# Patient Record
Sex: Female | Born: 1935
Health system: Southern US, Community
[De-identification: ages and names within clinical notes are randomized; demographics above are authoritative.]

## PROBLEM LIST (undated history)

## (undated) DIAGNOSIS — N83209 Unspecified ovarian cyst, unspecified side: Secondary | ICD-10-CM

## (undated) DIAGNOSIS — F329 Major depressive disorder, single episode, unspecified: Secondary | ICD-10-CM

## (undated) DIAGNOSIS — H269 Unspecified cataract: Secondary | ICD-10-CM

## (undated) DIAGNOSIS — E785 Hyperlipidemia, unspecified: Secondary | ICD-10-CM

## (undated) DIAGNOSIS — C50919 Malignant neoplasm of unspecified site of unspecified female breast: Secondary | ICD-10-CM

## (undated) DIAGNOSIS — I10 Essential (primary) hypertension: Secondary | ICD-10-CM

## (undated) DIAGNOSIS — K5790 Diverticulosis of intestine, part unspecified, without perforation or abscess without bleeding: Secondary | ICD-10-CM

## (undated) DIAGNOSIS — N950 Postmenopausal bleeding: Secondary | ICD-10-CM

## (undated) DIAGNOSIS — K5792 Diverticulitis of intestine, part unspecified, without perforation or abscess without bleeding: Secondary | ICD-10-CM

## (undated) DIAGNOSIS — F32A Depression, unspecified: Secondary | ICD-10-CM

## (undated) DIAGNOSIS — M1990 Unspecified osteoarthritis, unspecified site: Secondary | ICD-10-CM

## (undated) DIAGNOSIS — R55 Syncope and collapse: Secondary | ICD-10-CM

## (undated) DIAGNOSIS — R079 Chest pain, unspecified: Secondary | ICD-10-CM

## (undated) DIAGNOSIS — R87619 Unspecified abnormal cytological findings in specimens from cervix uteri: Secondary | ICD-10-CM

## (undated) DIAGNOSIS — M8580 Other specified disorders of bone density and structure, unspecified site: Secondary | ICD-10-CM

## (undated) DIAGNOSIS — E049 Nontoxic goiter, unspecified: Secondary | ICD-10-CM

## (undated) DIAGNOSIS — E559 Vitamin D deficiency, unspecified: Secondary | ICD-10-CM

## (undated) DIAGNOSIS — K635 Polyp of colon: Secondary | ICD-10-CM

## (undated) DIAGNOSIS — E7800 Pure hypercholesterolemia, unspecified: Secondary | ICD-10-CM

## (undated) DIAGNOSIS — G250 Essential tremor: Secondary | ICD-10-CM

## (undated) DIAGNOSIS — Z950 Presence of cardiac pacemaker: Secondary | ICD-10-CM

## (undated) HISTORY — DX: Other specified disorders of bone density and structure, unspecified site: M85.80

## (undated) HISTORY — DX: Syncope and collapse: R55

## (undated) HISTORY — DX: Pure hypercholesterolemia, unspecified: E78.00

## (undated) HISTORY — DX: Unspecified ovarian cyst, unspecified side: N83.209

## (undated) HISTORY — DX: Vitamin D deficiency, unspecified: E55.9

## (undated) HISTORY — DX: Nontoxic goiter, unspecified: E04.9

## (undated) HISTORY — DX: Unspecified abnormal cytological findings in specimens from cervix uteri: R87.619

## (undated) HISTORY — DX: Diverticulitis of intestine, part unspecified, without perforation or abscess without bleeding: K57.92

## (undated) HISTORY — DX: Depression, unspecified: F32.A

## (undated) HISTORY — DX: Polyp of colon: K63.5

## (undated) HISTORY — DX: Unspecified cataract: H26.9

## (undated) HISTORY — DX: Postmenopausal bleeding: N95.0

## (undated) HISTORY — PX: HYSTEROSCOPY: SHX211

## (undated) HISTORY — DX: Major depressive disorder, single episode, unspecified: F32.9

---

## 1994-08-23 HISTORY — PX: BREAST LUMPECTOMY: SHX2

## 1995-12-23 DIAGNOSIS — M8580 Other specified disorders of bone density and structure, unspecified site: Secondary | ICD-10-CM

## 1995-12-23 HISTORY — DX: Other specified disorders of bone density and structure, unspecified site: M85.80

## 1997-12-23 ENCOUNTER — Other Ambulatory Visit: Admission: RE | Admit: 1997-12-23 | Discharge: 1997-12-23 | Payer: Self-pay | Admitting: Obstetrics and Gynecology

## 1998-07-04 ENCOUNTER — Other Ambulatory Visit: Admission: RE | Admit: 1998-07-04 | Discharge: 1998-07-04 | Payer: Self-pay | Admitting: Obstetrics and Gynecology

## 1998-11-03 ENCOUNTER — Other Ambulatory Visit: Admission: RE | Admit: 1998-11-03 | Discharge: 1998-11-03 | Payer: Self-pay | Admitting: Obstetrics and Gynecology

## 1999-03-20 ENCOUNTER — Encounter: Admission: RE | Admit: 1999-03-20 | Discharge: 1999-06-18 | Payer: Self-pay | Admitting: Surgery

## 2000-01-21 ENCOUNTER — Other Ambulatory Visit: Admission: RE | Admit: 2000-01-21 | Discharge: 2000-01-21 | Payer: Self-pay | Admitting: Obstetrics and Gynecology

## 2000-01-23 DIAGNOSIS — N950 Postmenopausal bleeding: Secondary | ICD-10-CM

## 2000-01-23 HISTORY — DX: Postmenopausal bleeding: N95.0

## 2000-02-09 ENCOUNTER — Encounter (INDEPENDENT_AMBULATORY_CARE_PROVIDER_SITE_OTHER): Payer: Self-pay | Admitting: Specialist

## 2000-02-09 ENCOUNTER — Other Ambulatory Visit: Admission: RE | Admit: 2000-02-09 | Discharge: 2000-02-09 | Payer: Self-pay | Admitting: Obstetrics and Gynecology

## 2000-02-23 ENCOUNTER — Ambulatory Visit (HOSPITAL_COMMUNITY): Admission: RE | Admit: 2000-02-23 | Discharge: 2000-02-23 | Payer: Self-pay | Admitting: Obstetrics and Gynecology

## 2000-02-23 ENCOUNTER — Encounter (INDEPENDENT_AMBULATORY_CARE_PROVIDER_SITE_OTHER): Payer: Self-pay | Admitting: Specialist

## 2001-01-05 ENCOUNTER — Other Ambulatory Visit: Admission: RE | Admit: 2001-01-05 | Discharge: 2001-01-05 | Payer: Self-pay | Admitting: Obstetrics and Gynecology

## 2002-01-08 ENCOUNTER — Other Ambulatory Visit: Admission: RE | Admit: 2002-01-08 | Discharge: 2002-01-08 | Payer: Self-pay | Admitting: Obstetrics and Gynecology

## 2003-02-12 ENCOUNTER — Encounter: Admission: RE | Admit: 2003-02-12 | Discharge: 2003-02-12 | Payer: Self-pay | Admitting: Gastroenterology

## 2003-02-12 ENCOUNTER — Encounter: Payer: Self-pay | Admitting: Gastroenterology

## 2003-03-11 ENCOUNTER — Ambulatory Visit (HOSPITAL_COMMUNITY): Admission: RE | Admit: 2003-03-11 | Discharge: 2003-03-11 | Payer: Self-pay | Admitting: Gastroenterology

## 2003-03-11 ENCOUNTER — Encounter (INDEPENDENT_AMBULATORY_CARE_PROVIDER_SITE_OTHER): Payer: Self-pay | Admitting: Specialist

## 2003-04-15 ENCOUNTER — Ambulatory Visit (HOSPITAL_COMMUNITY): Admission: RE | Admit: 2003-04-15 | Discharge: 2003-04-15 | Payer: Self-pay | Admitting: Gastroenterology

## 2003-04-15 ENCOUNTER — Encounter (INDEPENDENT_AMBULATORY_CARE_PROVIDER_SITE_OTHER): Payer: Self-pay | Admitting: *Deleted

## 2004-01-15 ENCOUNTER — Other Ambulatory Visit: Admission: RE | Admit: 2004-01-15 | Discharge: 2004-01-15 | Payer: Self-pay | Admitting: Obstetrics and Gynecology

## 2006-01-17 ENCOUNTER — Other Ambulatory Visit: Admission: RE | Admit: 2006-01-17 | Discharge: 2006-01-17 | Payer: Self-pay | Admitting: Obstetrics & Gynecology

## 2006-01-27 ENCOUNTER — Other Ambulatory Visit: Admission: RE | Admit: 2006-01-27 | Discharge: 2006-01-27 | Payer: Self-pay | Admitting: Obstetrics & Gynecology

## 2006-07-19 ENCOUNTER — Ambulatory Visit: Payer: Self-pay | Admitting: Gastroenterology

## 2006-08-24 ENCOUNTER — Ambulatory Visit: Payer: Self-pay | Admitting: Pulmonary Disease

## 2006-09-15 ENCOUNTER — Ambulatory Visit: Payer: Self-pay | Admitting: Gastroenterology

## 2006-09-29 ENCOUNTER — Encounter (INDEPENDENT_AMBULATORY_CARE_PROVIDER_SITE_OTHER): Payer: Self-pay | Admitting: Specialist

## 2006-09-29 ENCOUNTER — Ambulatory Visit: Payer: Self-pay | Admitting: Gastroenterology

## 2008-01-25 ENCOUNTER — Other Ambulatory Visit: Admission: RE | Admit: 2008-01-25 | Discharge: 2008-01-25 | Payer: Self-pay | Admitting: Obstetrics & Gynecology

## 2009-09-19 ENCOUNTER — Encounter (INDEPENDENT_AMBULATORY_CARE_PROVIDER_SITE_OTHER): Payer: Self-pay | Admitting: *Deleted

## 2009-10-14 ENCOUNTER — Encounter (INDEPENDENT_AMBULATORY_CARE_PROVIDER_SITE_OTHER): Payer: Self-pay | Admitting: *Deleted

## 2009-10-16 ENCOUNTER — Ambulatory Visit: Payer: Self-pay | Admitting: Gastroenterology

## 2009-10-27 ENCOUNTER — Telehealth: Payer: Self-pay | Admitting: Gastroenterology

## 2009-10-31 ENCOUNTER — Ambulatory Visit: Payer: Self-pay | Admitting: Gastroenterology

## 2010-05-29 ENCOUNTER — Inpatient Hospital Stay (HOSPITAL_COMMUNITY)
Admission: EM | Admit: 2010-05-29 | Discharge: 2010-05-30 | Payer: Self-pay | Source: Home / Self Care | Attending: Internal Medicine | Admitting: Internal Medicine

## 2010-05-30 ENCOUNTER — Encounter (INDEPENDENT_AMBULATORY_CARE_PROVIDER_SITE_OTHER): Payer: Self-pay | Admitting: Internal Medicine

## 2010-06-08 LAB — CARDIAC PANEL(CRET KIN+CKTOT+MB+TROPI)
CK, MB: 1.7 ng/mL (ref 0.3–4.0)
CK, MB: 1.5 ng/mL (ref 0.3–4.0)
Relative Index: INVALID (ref 0.0–2.5)
Relative Index: INVALID (ref 0.0–2.5)
Total CK: 71 U/L (ref 7–177)
Total CK: 72 U/L (ref 7–177)
Troponin I: 0.02 ng/mL (ref 0.00–0.06)
Troponin I: 0.02 ng/mL (ref 0.00–0.06)

## 2010-06-08 LAB — DIFFERENTIAL
Basophils Absolute: 0 10*3/uL (ref 0.0–0.1)
Basophils Absolute: 0 10*3/uL (ref 0.0–0.1)
Basophils Relative: 0 % (ref 0–1)
Basophils Relative: 0 % (ref 0–1)
Eosinophils Absolute: 0.1 10*3/uL (ref 0.0–0.7)
Eosinophils Absolute: 0.1 10*3/uL (ref 0.0–0.7)
Eosinophils Relative: 1 % (ref 0–5)
Eosinophils Relative: 1 % (ref 0–5)
Lymphocytes Relative: 9 % — ABNORMAL LOW (ref 12–46)
Lymphocytes Relative: 14 % (ref 12–46)
Lymphs Abs: 1.1 10*3/uL (ref 0.7–4.0)
Lymphs Abs: 1.4 10*3/uL (ref 0.7–4.0)
Monocytes Absolute: 1.1 10*3/uL — ABNORMAL HIGH (ref 0.1–1.0)
Monocytes Absolute: 1.3 10*3/uL — ABNORMAL HIGH (ref 0.1–1.0)
Monocytes Relative: 9 % (ref 3–12)
Monocytes Relative: 13 % — ABNORMAL HIGH (ref 3–12)
Neutro Abs: 9.9 10*3/uL — ABNORMAL HIGH (ref 1.7–7.7)
Neutro Abs: 7.2 10*3/uL (ref 1.7–7.7)
Neutrophils Relative %: 81 % — ABNORMAL HIGH (ref 43–77)
Neutrophils Relative %: 72 % (ref 43–77)

## 2010-06-08 LAB — CBC
HCT: 42.8 % (ref 36.0–46.0)
HCT: 41.6 % (ref 36.0–46.0)
Hemoglobin: 14.7 g/dL (ref 12.0–15.0)
Hemoglobin: 14.4 g/dL (ref 12.0–15.0)
MCH: 30.7 pg (ref 26.0–34.0)
MCH: 31 pg (ref 26.0–34.0)
MCHC: 34.3 g/dL (ref 30.0–36.0)
MCHC: 34.6 g/dL (ref 30.0–36.0)
MCV: 89.4 fL (ref 78.0–100.0)
MCV: 89.5 fL (ref 78.0–100.0)
Platelets: 193 10*3/uL (ref 150–400)
Platelets: 210 10*3/uL (ref 150–400)
RBC: 4.79 MIL/uL (ref 3.87–5.11)
RBC: 4.65 MIL/uL (ref 3.87–5.11)
RDW: 12.9 % (ref 11.5–15.5)
RDW: 13.1 % (ref 11.5–15.5)
WBC: 12.3 10*3/uL — ABNORMAL HIGH (ref 4.0–10.5)
WBC: 10.1 10*3/uL (ref 4.0–10.5)

## 2010-06-08 LAB — COMPREHENSIVE METABOLIC PANEL
ALT: 35 U/L (ref 0–35)
AST: 43 U/L — ABNORMAL HIGH (ref 0–37)
Albumin: 3.4 g/dL — ABNORMAL LOW (ref 3.5–5.2)
Alkaline Phosphatase: 67 U/L (ref 39–117)
BUN: 12 mg/dL (ref 6–23)
CO2: 25 mEq/L (ref 19–32)
Calcium: 8.7 mg/dL (ref 8.4–10.5)
Chloride: 103 mEq/L (ref 96–112)
Creatinine, Ser: 0.8 mg/dL (ref 0.4–1.2)
GFR calc Af Amer: >60 mL/min (ref >60)
GFR calc non Af Amer: >60 mL/min (ref >60)
Glucose, Bld: 138 mg/dL — ABNORMAL HIGH (ref 70–99)
Potassium: 3.8 mEq/L (ref 3.5–5.1)
Sodium: 135 mEq/L (ref 135–145)
Total Bilirubin: 0.5 mg/dL (ref 0.3–1.2)
Total Protein: 6.6 g/dL (ref 6.0–8.3)

## 2010-06-08 LAB — POCT CARDIAC MARKERS
CKMB, poc: <1 ng/mL — ABNORMAL LOW (ref 1.0–8.0)
Myoglobin, poc: 59.5 ng/mL (ref 12–200)
Troponin i, poc: <0.05 ng/mL (ref 0.00–0.09)

## 2010-06-08 LAB — CK TOTAL AND CKMB (NOT AT ARMC)
CK, MB: 1.7 ng/mL (ref 0.3–4.0)
Relative Index: INVALID (ref 0.0–2.5)
Total CK: 72 U/L (ref 7–177)

## 2010-06-08 LAB — HEMOGLOBIN A1C
Hgb A1c MFr Bld: 5.6 % (ref <5.7)
Mean Plasma Glucose: 114 mg/dL (ref <117)

## 2010-06-08 LAB — POCT I-STAT, CHEM 8
BUN: 17 mg/dL (ref 6–23)
Calcium, Ion: 1.09 mmol/L — ABNORMAL LOW (ref 1.12–1.32)
Chloride: 104 mEq/L (ref 96–112)
Creatinine, Ser: 0.8 mg/dL (ref 0.4–1.2)
Glucose, Bld: 138 mg/dL — ABNORMAL HIGH (ref 70–99)
HCT: 45 % (ref 36.0–46.0)
Hemoglobin: 15.3 g/dL — ABNORMAL HIGH (ref 12.0–15.0)
Potassium: 3.2 mEq/L — ABNORMAL LOW (ref 3.5–5.1)
Sodium: 137 mEq/L (ref 135–145)
TCO2: 25 mmol/L (ref 0–100)

## 2010-06-08 LAB — TROPONIN I: Troponin I: 0.01 ng/mL (ref 0.00–0.06)

## 2010-06-08 LAB — D-DIMER, QUANTITATIVE: D-Dimer, Quant: <0.22 ug/mL-FEU (ref 0.00–0.48)

## 2010-06-19 NOTE — H&P (Signed)
Michelle Fuller, [name]:  [id number]  MEDICAL RECORD NO.:  [id number]          PATIENT TYPE:  INP  LOCATION:  1831                         FACILITY:  [facility]  PHYSICIAN:  Massie Maroon, MD        DATE OF BIRTH:  Nov 12, 1934  DATE OF ADMISSION:  05/29/2010 DATE OF DISCHARGE:                             HISTORY & PHYSICAL   CHIEF COMPLAINT:  "I passed out."  HISTORY OF PRESENT ILLNESS:  A 76 year old female with a history of hypertension, hyperlipidemia, tremors, and remote history of breast cancer apparently was sitting at supper when she passed out.  She had apparently drank some at suppertime and also had Klonopin.  She thinks that this might have contributed to her passing out.  She also noted that she has not been feeling well recently and has not been eating or drinking as much in terms of p.o. intake.  EMS arrived at the restaurant and noticed that the patient was apparently unconscious.  She has been passed out for nearly about 5 minutes per her husband.  The patient states that she had no presyncopal symptoms such as lightheadedness, dizziness, chest pain, palpitations, shortness of breath, nausea, vomiting.  The patient was noted to have O2 sat of 89% and her blood pressure was apparently 92/50 initially with a heart rate of 60.  Her husband who witnessed the syncope apparently states that she did not have any seizure-like activity, incontinence or focal weakness, numbness, tingling, or postictal state when she woke up.  The patient will be admitted for workup of syncope.  Orthostatics were done in the ED, but they were done about 3 hours after arrival.  It appeared to be normal.  Admission for syncope as stated above.  PAST MEDICAL HISTORY: 1. Hypertension. 2. Hyperlipidemia. 3. Essential tremor. 4. History of breast cancer. 5. History of goiter.  PAST SURGICAL HISTORY:  Right lumpectomy in 1996.  SOCIAL HISTORY:  The patient has never smoked and  drinks about half a glass of wine per night.  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  FAMILY HISTORY:  Mother died of heart attack and was a nonsmoker.  She died at age 32.  Her father died at age 98 of old age and had Alzheimer's.  ALLERGIES:  PENICILLIN causes rash.  MEDICATIONS: 1. Klonopin 1 mg p.o. daily as needed. 2. Norvasc 5 mg p.o. daily. 3. Propranolol SR 160 mg p.o. daily. 4. Diovan 320 mg p.o. daily. 5. Evista 60 mg p.o. daily. 6. Indapamide 1.25 mg p.o. daily. 7. Lipitor 10 mg p.o. daily. 8. Multivitamin 1 p.o. daily. 9. Prilosec 20 mg p.o. daily as needed. 10.Primidone 150 mg p.o. at bedtime.  REVIEW OF SYSTEMS:  Negative for all 10-organ systems except for pertinent positives as stated above.  PHYSICAL EXAMINATION:  VITAL SIGNS:  Temperature 97.3, pulse 68, blood pressure 100/56, pulse ox is 99% on room air. HEENT:  Anicteric, EOMI, no nystagmus.  Pupils 1.5 mm, symmetric direct consensual, near reflex intact.  Mucous membranes moist.  Tongue midline. NECK:  No JVD, no bruit, no thyromegaly, no adenopathy. HEART:  Regular  rate and rhythm.  S1 and S2.  No murmurs, gallops, or rubs. LUNGS:  Clear to auscultation bilaterally. ABDOMEN:  Soft, obese, nontender, nondistended.  Positive bowel sounds. EXTREMITIES:  No cyanosis, clubbing or edema. SKIN:  No rashes. LYMPH NODES:  No adenopathy. NEUROLOGIC:  Nonfocal.  Cranial nerves II through XII intact.  Reflexes 2+, symmetric, diffuse with downgoing toes bilaterally.  Motor strength 5/5 in all 4 extremities, pinprick intact, no pronator drift.  LABORATORY DATA:  Cardiac markers: Troponin I less than 0.05.  WBC 12.3, hemoglobin 14.7, platelet count is 193.  BUN 17, creatinine 0.8.  Sodium 137, potassium 3.2.  Chest x-ray shows that the heart is upper limits of normal size, minimal bibasilar atelectasis.  EKG shows normal sinus rhythm at 70, normal axis.  First-degree AV block, early R-wave  progression.  No ST-T segment changes consistent with acute ischemia.  There was noted T-wave inversion in aVL, but this is the only abnormality that I saw.  There is no prior EKG for comparison.  ASSESSMENT AND PLAN: 1. Syncope:  We will check a CT brain.  We will check cardiac markers     q.6 h x3 sets.  The patient will be placed on telemetry.  We will     check a cardiac echo as well as a carotid ultrasound.  We will     check urinalysis to make sure that there is no urinary tract     infection precipitating weakness and possible syncope.  I believe     that her syncope is most likely a combination of Klonopin and     alcohol and probably some dehydration due to the fact that she had     not been eating and drinking well because of recent illness, that     is the most likely cause of her hypotension.  She may have even     been orthostatic; however, orthostatics were not checked until 4     hours after the time that she initially arrived in the ED.  We will     check a D-dimer and if positive, obtain a CTA chest to rule out     pulmonary embolus as a cause of syncope. 2. Hypertension:  Continue Norvasc, propranolol, and Diovan and hold     off on indapamide for now in light of her hypokalemia.  This can be     restarted if her blood pressure rises. 3. Hypokalemia:  Replete potassium. 4. Hyperlipidemia:  Continue Lipitor. 5. Essential tremor:  Continue primidone. 6. Deep venous thrombosis prophylaxis:  Lovenox.     Massie Maroon, MD     JYK/MEDQ  D:  05/30/2010  T:  05/30/2010  Job:  [id number]  cc:   [name], M.D.  Electronically Signed by Pearson Grippe MD on 06/19/2010 11:50:17 PM

## 2010-06-23 NOTE — Progress Notes (Signed)
Summary: phone  Phone Note Call from Patient   Caller: Patient Summary of Call: Pt phoned, stating she had been started on a 10 day antibiotic from Urgent Care.  She said she has had some diarrhea since starting ATB and wondered if that would affect her exam.  Writer told her to follow prep instructions but to really push fluids before exam so as not to become dehydrated.  Pt voiced understanding. Initial call taken by: Karl Bales RN,  October 27, 2009 2:40 PM

## 2010-06-23 NOTE — Letter (Signed)
Summary: [facility] Instructions  [facility]  [address] [place], [place] [address]   Phone: [phone]  Fax: [phone]       Michelle Fuller    05/09/1935    MRN: [id number]        Procedure Day Dorna Bloom: Farrell Ours  10/31/09     Arrival Time:  7:30am     Procedure Time:  8:30am     Location of Procedure:                    Juliann Pares  [facility] (4th Floor)                        PREPARATION FOR COLONOSCOPY WITH MOVIPREP   Starting 5 days prior to your procedure  SUNDAY 06/05  do not eat nuts, seeds, popcorn, corn, beans, peas,  salads, or any raw vegetables.  Do not take any fiber supplements (e.g. Metamucil, Citrucel, and Benefiber).  THE DAY BEFORE YOUR PROCEDURE         DATE:  THURSDAY  06/09  1.  Drink clear liquids the entire day-NO SOLID FOOD  2.  Do not drink anything colored red or purple.  Avoid juices with pulp.  No orange juice.  3.  Drink at least 64 oz. (8 glasses) of fluid/clear liquids during the day to prevent dehydration and help the prep work efficiently.  CLEAR LIQUIDS INCLUDE: Water Jello Ice Popsicles Tea (sugar ok, no milk/cream) Powdered fruit flavored drinks Coffee (sugar ok, no milk/cream) Gatorade Juice: apple, white grape, white cranberry  Lemonade Clear bullion, consomm, broth Carbonated beverages (any kind) Strained chicken noodle soup Hard Candy                             4.  In the morning, mix first dose of MoviPrep solution:    Empty 1 Pouch A and 1 Pouch B into the disposable container    Add lukewarm drinking water to the top line of the container. Mix to dissolve    Refrigerate (mixed solution should be used within 24 hrs)  5.  Begin drinking the prep at 5:00 p.m. The MoviPrep container is divided by 4 marks.   Every 15 minutes drink the solution down to the next mark (approximately 8 oz) until the full liter is complete.   6.  Follow completed prep with 16 oz of clear liquid of your choice  (Nothing red or purple).  Continue to drink clear liquids until bedtime.  7.  Before going to bed, mix second dose of MoviPrep solution:    Empty 1 Pouch A and 1 Pouch B into the disposable container    Add lukewarm drinking water to the top line of the container. Mix to dissolve    Refrigerate  THE DAY OF YOUR PROCEDURE      DATE: FRIDAY  06/10  Beginning at  3:30 a.m. (5 hours before procedure):         1. Every 15 minutes, drink the solution down to the next mark (approx 8 oz) until the full liter is complete.  2. Follow completed prep with 16 oz. of clear liquid of your choice.    3. You may drink clear liquids until  6:30am  (2 HOURS BEFORE PROCEDURE).   MEDICATION INSTRUCTIONS  Unless otherwise instructed, you should take regular prescription medications with a small sip of water   as  early as possible the morning of your procedure.         OTHER INSTRUCTIONS  You will need a responsible adult at least 74 years of age to accompany you and drive you home.   This person must remain in the waiting room during your procedure.  Wear loose fitting clothing that is easily removed.  Leave jewelry and other valuables at home.  However, you may wish to bring a book to read or  an iPod/MP3 player to listen to music as you wait for your procedure to start.  Remove all body piercing jewelry and leave at home.  Total time from sign-in until discharge is approximately 2-3 hours.  You should go home directly after your procedure and rest.  You can resume normal activities the  day after your procedure.  The day of your procedure you should not:   Drive   Make legal decisions   Operate machinery   Drink alcohol   Return to work  You will receive specific instructions about eating, activities and medications before you leave.    The above instructions have been reviewed and explained to me by  Karl Bales RN  Oct 16, 2009 1:43 PM      I fully understand  and can verbalize these instructions _____________________________ Date _________

## 2010-06-23 NOTE — Procedures (Signed)
Summary: Colonoscopy  Patient: [name] Note: All result statuses are Final unless otherwise noted.  Tests: (1) Colonoscopy (COL)   COL Colonoscopy           DONE     [facility]     [address]     [place], [place]  [address]           COLONOSCOPY PROCEDURE REPORT           PATIENT:  Michelle Fuller, Michelle Fuller  MR#:  [id number]     BIRTHDATE:  12/12/1934, 75 yrs. old  GENDER:  female     ENDOSCOPIST:  Judie Petit T. Russella Dar, MD, [facility]           PROCEDURE DATE:  10/31/2009     PROCEDURE:  Higher-risk screening colonoscopy G0105     ASA CLASS:  Class II     INDICATIONS:  follow-up of polyp, surveillance and high-risk     screening, tubulovillous adenoma, 03/2003.     MEDICATIONS:   Fentanyl 100 mcg IV, Versed 10 mg IV     DESCRIPTION OF PROCEDURE:   After the risks benefits and     alternatives of the procedure were thoroughly explained, informed     consent was obtained.  Digital rectal exam was performed and     revealed no abnormalities.   The LB PCF-H180AL [id number] endoscope     was introduced through the anus and advanced to the cecum, which     was identified by both the appendix and ileocecal valve, limited     by a tortuous and redundant colon.    The quality of the prep was     good, using MoviPrep.  The instrument was then slowly withdrawn as     the colon was fully examined.     <<PROCEDUREIMAGES>>     FINDINGS:  Moderate diverticulosis was found in the sigmoid to     ascending colon. This was otherwise a normal examination of the     colon.   Retroflexed views in the rectum revealed internal     hemorrhoids, small. The time to cecum =  8.25  minutes. The scope     was then withdrawn (time =  16  min) from the patient and the     procedure completed.     COMPLICATIONS:  None           ENDOSCOPIC IMPRESSION:     1) Moderate diverticulosis in the sigmoid to ascending colon     2) Internal hemorrhoids           RECOMMENDATIONS:     1) High fiber diet with  liberal fluid intake.     2) Repeat Colonoscopy in 5 years.           [name]. Russella Dar, MD, [facility]           CC: Hali Marry, MD           n.     [name]. Stark at 10/31/2009 09:11 AM           [name], [phone]  Note: An exclamation mark (!) indicates a result that was not dispersed into the flowsheet. Document Creation Date: 10/31/2009 9:12 AM _______________________________________________________________________  (1) Order result status: Final Collection or observation date-time: 10/31/2009 09:06 Requested date-time:  Receipt date-time:  Reported date-time:  Referring Physician:   Ordering Physician: Claudette Head [phone]) Specimen Source:  Source: Launa Grill Order Number: [id number] Lab site:  Appended Document: Colonoscopy    Clinical Lists Changes  Observations: Added new observation of COLONNXTDUE: 10/2014 (10/31/2009 13:40)

## 2010-06-23 NOTE — Miscellaneous (Signed)
Summary: [facility] previsit  Clinical Lists Changes  Medications: Added new medication of MOVIPREP 100 GM  SOLR (PEG-KCL-NACL-NASULF-NA ASC-C) As per prep instructions. - Signed Rx of MOVIPREP 100 GM  SOLR (PEG-KCL-NACL-NASULF-NA ASC-C) As per prep instructions.;  #1 x 0;  Signed;  Entered by: Karl Bales RN;  Authorized by: Meryl Dare MD [facility];  Method used: [facility] to [facility]  [phone]*, [address] [place], [place]  [address], Ph: [phone] or [phone], Fax: [phone] Allergies: Added new allergy or adverse reaction of PCN Observations: Added new observation of NKA: F (10/16/2009 13:06)    Prescriptions: MOVIPREP 100 GM  SOLR (PEG-KCL-NACL-NASULF-NA ASC-C) As per prep instructions.  #1 x 0   Entered by:   Karl Bales RN   Authorized by:   Meryl Dare MD [facility]   Signed by:   Karl Bales RN on 10/16/2009   Method used:   [facility]ically to        [facility]  [phone]* (retail)       [address] [place], [place]  [address]       Ph: [phone] or [phone]       Fax: [phone]   RxID:   [id number]

## 2010-06-23 NOTE — Letter (Signed)
Summary: Colonoscopy Letter  [facility]  [address] [place], [place] [address]   Phone: [phone]  Fax: [phone]      September 19, 2009 MRN: [id number]   Michelle Fuller [address] [place], [place]  [address]   Dear Ms. Rickett,   According to your medical record, it is time for you to schedule a Colonoscopy. The American Cancer Society recommends this procedure as a method to detect early colon cancer. Patients with a family history of colon cancer, or a personal history of colon polyps or inflammatory bowel disease are at increased risk.  This letter has beeen generated based on the recommendations made at the time of your procedure. If you feel that in your particular situation this may no longer apply, please contact our office.  Please call our office at [phone] to schedule this appointment or to update your records at your earliest convenience.  Thank you for cooperating with [place] to provide you with the very best care possible.   Sincerely,  Judie Petit T. Russella Dar, M.D.  [facility] [phone]

## 2010-07-08 NOTE — Discharge Summary (Signed)
  Michelle Fuller, [name]:  [id number]  MEDICAL RECORD NO.:  [id number]          PATIENT TYPE:  INP  LOCATION:  3701                         FACILITY:  [facility]  PHYSICIAN:  Zannie Cove, MD     DATE OF BIRTH:  1934/10/06  DATE OF ADMISSION:  05/29/2010 DATE OF DISCHARGE:  05/30/2010                        DISCHARGE SUMMARY - REFERRING   PRIMARY CARE PHYSICIAN:  Michelle Marry, MD of Endocrinology  DISCHARGE DIAGNOSES: 1. Syncope most likely vasovagal in the etiology with possibly     dehydration contributing. 2. History of essential tremors. 3. History of breast cancer. 4. History of goiter. 5. Hypertension. 6. Dyslipidemia. 7. Severe anxiety disorder.  DISCHARGE MEDICATIONS: 1. Aspirin 81 mg daily. 2. Amlodipine 5 mg daily. 3. Citracal plus D 2 tablets b.i.d. 4. Evista 60 mg daily. 5. Lipitor 10 mg daily. 6. Multivitamin 1 tablet daily. 7. Omeprazole over-the-counter 1 tablet daily. 8. Primidone 50 mg 3 tablets daily. 9. Propranolol XR 160 mg p.o. q.a.m.  DISCONTINUED MEDICATIONS:  Include indapamide 1.25 mg p.o. q.a.m. and Diovan 320 mg p.o. q.a.m.  DIAGNOSTIC INVESTIGATIONS: 1. Chest x-ray 01/07, bibasilar atelectasis.  CT of the head 01/07:     No acute intracranial abnormality. 2. 2-D echo 01/07 showed normal LV cavity size, mild LVH, ejection     fraction 55 to 60%, wall motion normal. Mild mitral regurgitation.     Grade 1 diastolic dysfunction.  HOSPITAL COURSE:  [name] is a 76 year old pleasant, Caucasian female history of hypertension, essential tremors, anxiety disorder. Apparently went out with friends to a restaurant [place] more alcohol than usual and took her Klonopin and subsequently had a syncopal episode, without any seizures. She was admitted to the hospital for observation and had cardiac markers and EKG which were unremarkable. In addition also had a CT head which was normal as well.  She was monitored on telemetry.  Did not have any arrhythmias, and vital signs were felt to be stable as well.  I feel that her syncope was a combination of possibly dehydration and anxiety from social environment as well as taking a combination of Klonopin and alcohol the night of the event.  Hence, we have cut down on her antihypertensives from 4to 2 tablets. She was also ruled out for PE by a negative D-dimer.  In addition, her echocardiogram did not show any evidence of any structural heart disease or cardiomyopathy. Discharge condition is stable.  PHYSICAL EXAMINATION:  Vital signs on discharge:  Temperature is 97 seven, pulse 81, blood pressure 142/65, respirations 27, sating 96% room air.  DISCHARGE FOLLOWUP:  Dr. Casimiro Needle Altheimer in 1 week.     Zannie Cove, MD     PJ/MEDQ  D:  06/02/2010  T:  06/02/2010  Job:  [id number]  cc:   [name], M.D.  Electronically Signed by Zannie Cove  on 07/08/2010 01:36:07 PM

## 2010-10-09 NOTE — Assessment & Plan Note (Signed)
Michelle Fuller                             Michelle Fuller   Michelle Fuller, Michelle Fuller                   MRN:          [id number]  DATE:08/24/2006                            DOB:          [birth year]    Michelle Fuller   HISTORY OF PRESENT ILLNESS:  The patient is a very pleasant 76 year old  female whom I have been asked to see for possible Michelle apnea.  The  patient states that she has noticed recently that she is having snoring  arousals with Michelle onset.  She read in a magazine that alcohol may  predispose to this and discontinued this, and did see some improvement.  The patient states that her spouse has not noted significant snoring,  nor has he noted pauses in her breathing during Michelle.  The patient  typically gets to bed between 9 and 10 at night and gets up at 7:30 in  the morning to start her day.  She feels fairly rested.  She is very  energetic through the morning hours and only has very rare sleepiness in  the afternoon with very sedentary activity.  She can occasionally doze  with TV in the afternoon.  Overall, she feels that her alertness level  is very acceptable to her and that it is not decreasing her quality of  life.  The patient does not have any issues with sleepiness while  driving.  Of Fuller, her weight has been stable the past few years.   PAST MEDICAL HISTORY:  1. Significant for hypertension.  2. Dyslipidemia.  3. History of breast cancer with surgery.  4. History of goiter.   MEDICATIONS:  1. Propranolol 150 mg daily.  2. Amlodipine 5 mg daily.  3. Evista 60 mg daily.  4. Indapamide 1.25 mg daily.  5. Lipitor 10 mg daily.  6. Citracal 250 four daily.  7. Multivitamin daily.  8. Prilosec 20 mg p.r.n.   The patient has an allergy to PENICILLIN, which causes a rash.   SOCIAL HISTORY:  She is married and has children.  She has never smoked.   FAMILY HISTORY:  Remarkable for father having hypertension  and mother  having thyroid disease.   REVIEW OF SYSTEMS:  As per the history of present illness.  Also see the  patient intake form documented on the chart.   PHYSICAL EXAM:  GENERAL:  She is a well-developed but overweight female  in no acute distress.  Blood pressure 128/74, pulse 80, temperature 97.9, weight 153 pounds.  She is 5 feet 4 inches tall.  O2 saturation on room air is 95%.  HEENT:  Pupils are equal, round, and reactive to light and  accommodation.  Extraocular muscles are intact.  Nares are very  narrowed, but patent.  The septum seems to be midline.  Oropharynx does  show moderate elongation of the soft palate and especially the uvula.  NECK:  Supple without JVD or lymphadenopathy.  There is no palpable  thyromegaly.  CHEST:  Totally clear.  CARDIAC:  Regular rate and rhythm.  No murmurs, rubs, or gallops.  ABDOMEN:  Soft and  nontender with good bowel sounds.  GENITAL, RECTAL, AND BREASTS:  Not done and not indicated.  LOWER EXTREMITIES:  No edema.  Pulses are intact distally.  NEUROLOGIC:  She is alert and oriented.  No obvious observable motor  defects.   IMPRESSION:  Asymptomatic snoring versus very mild obstructive Michelle  apnea.  The patient seems to Michelle well overall and is rested in the  mornings.  She does not have significant daytime alertness issues.  I  have told her that it is possible that she may have mild Michelle apnea,  but it is probably not clinically significant at this point.  If she  truly feels that her quality of life is not being overly affected, then  I would just recommend modest weight loss over the next 6 to 12 months,  and she should see improvement in her snoring.  If she is not satisfied  with her alertness level or quality of life, then I certainly think that  we should proceed with nocturnal polysomnogram to rule out Michelle apnea  and be willing to proceed with some type of treatment.  The patient has  voiced understanding about all of  this.   PLAN:  1. Work on weight loss over the next 6 to 12 months.  2. If the patient feels that her quality of life is being adversely      affected, or that her alertness level is not acceptable to her, she      is to call me so that we can set up nocturnal polysomnogram.  3. In terms of conscious sedation for GI procedures, I think she is at      extremely low risk for problems.  4. The patient will follow up on a p.r.n. basis.     Michelle Share, MD,FCCP  Electronically Signed    [facility]  DD: 08/24/2006  DT: 08/24/2006  Job #: [id number]   cc:   Michelle Fuller, M.D.

## 2010-10-09 NOTE — Op Note (Signed)
NAME:  JAQULYN, Michelle Fuller                      ACCOUNT NO.:  [id number]   MEDICAL RECORD NO.:  [id number]                   PATIENT TYPE:  AMB   LOCATION:  ENDO                                 FACILITY:  [facility]   PHYSICIAN:  Anselmo Rod, M.D.               DATE OF BIRTH:  [birth year]   DATE OF PROCEDURE:  04/15/2003  DATE OF DISCHARGE:                                 OPERATIVE REPORT   PROCEDURE PERFORMED:  Colonoscopy with snare polypectomy x1.   ENDOSCOPIST:  Anselmo Rod, M.D.   INSTRUMENT USED:  Olympus videocolonoscope.   INDICATION FOR THE PROCEDURE:  A 76 year old white female with a family  history of colon cancer in a paternal uncle undergoing screening colonoscopy  to rule out colonic polyps, masses, etc.   PREPROCEDURE PREPARATION:  Informed consent was procured from the patient.  The patient was fasted for eight hours prior to the procedure and prepped  with a bottle of magnesium citrate and a gallon of GoLYTELY the night prior  to the procedure.   PREPROCEDURE PHYSICAL:  VITAL SIGNS:  Stable.  NECK:  Supple.  CHEST:  Clear to auscultation.  ABDOMEN:  Soft with normal bowel sounds.   DESCRIPTION OF THE PROCEDURE:  The patient was placed in the left lateral  decubitus position and sedated with 7 mg of Demerol and 7 mg of Versed in  slow incremental doses.  Once the patient was adequately sedated and  maintained on low-flow oxygen and continuous cardiac monitoring, the Olympus  videocolonoscope was advanced from the rectum to the transverse colon with  difficulty.  The patient had pandiverticulosis with a large amount of solid  stool in certain area of the colon on the left side.  The adult colonoscope  was drawn as it was looping, and a pediatric adjustable scope used instead.  The patient's position was changed from the left lateral to the supine and  the right lateral position with application of abdominal pressure to reach  the cecum.  The appendiceal  orifice and ileocecal valve were visualized and  photographed.  A small sessile polyp measuring 5-6 mm was snared from 80 cm.  There was evidence of pandiverticulosis with inspissated stool in several of  the diverticula.  Small internal hemorrhoids were seen on retroflexion in  the rectum.   IMPRESSION:  1. Very tortuous colon.  The scope was changed from adult colonoscope to an     adjustable pediatric scope.  2. Sessile polyp biopsied at 80 cm.  3. Pandiverticulosis with residual stool in several of the diverticula.  4. Small internal hemorrhoids.  5. Small lesions could be have missed secondary to residual stool in the     colon.   RECOMMENDATIONS:  1. Await pathology results.  2. Avoid all nonsteroidals including aspirin for the next 3-4 weeks.  3. Outpatient followup in the next 4-6 weeks for further recommendations.  Anselmo Rod, M.D.    JNM/MEDQ  D:  04/15/2003  T:  04/15/2003  Job:  [id number]   cc:   Laqueta Linden, M.D.  [address], [address]  [place]  [place] [address]  Fax: [phone]   [name], M.D.  [address]  [place]  [place] [address]  Fax: [phone]

## 2010-10-09 NOTE — Op Note (Signed)
[facility]  Patient:    Michelle Fuller, Michelle Fuller                   MRN: [id number] Proc. Date: 02/23/00 Adm. Date:  [id number] Attending:  Jenean Lindau CC:         [name], M.D.  Maryln Gottron, M.D.  Velora Heckler, M.D.  Leighton Roach. Truett Perna, M.D.   Operative Report  PREOPERATIVE DIAGNOSES:   Postmenopausal bleeding status post resection of prolapsed; rule out residual polyps.  POSTOPERATIVE DIAGNOSES:  Postmenopausal bleeding due to multiple endometrial polyps, presumably tamoxifen induced.  OPERATION:  Hysteroscopic resection with dilatation and curettage.  SURGEON:  Laqueta Linden, M.D.  ANESTHESIA:  General.  ESTIMATED BLOOD LOSS:  Less than 50 cc.  SORBITOL:  Net intake 100 cc.  COMPLICATIONS:  None.  INDICATIONS:  Michelle Fuller is a 76 year old gravida 3, para 3, menopausal female who has breast cancer and finished a five year course of tamoxifen in April of this year.  In February 2000, she had some bleeding and was noted to have benign proliferative endometrium.  At that time she began cycling with progesterone which she did on an every other month basis and had regular anticipated withdrawal bleeds.  She continued to have some bleeding in spite of stopping her tamoxifen.  However, this was still at predictable intervals progesterone.  She took her progesterone in June and July with a minimal amount of bleeding.  In August she started bleeding and had a regular bleeding course which stopped for a few days and then resumed with rather significant bleeding throughout the month of September. She was seen in the office on February 08, 2000, with a stable hemoglobin and was noted on examination to have a huge prolapsing polyp that was quite necrotic protruding into the upper vagina through a cervix that was dilated to 3-4 cm.  This was grasped with a ring forceps and twisted off with a marked decrease in her bleeding  although continued bleeding noted.  The mass measured 6 x 4.5 x 3 cm with pathology revealing a lower segment polyp with simple hyperplasia without atypia and areas of hemorrhage with no atypia or carcinoma.  The patient was allowed to take some time to allow for cervix to clamp down a bit so that hysteroscopy could be performed.  She presents now for outpatient surgical evaluation.  It is presumed that she has other polyps or residual of the polyps that prolapsed and this needed to be resected once her cervix could accommodate the hysteroscope and was not dilated so much that a seal could not be obtained around the scope.  She presents now for outpatient surgical evaluation and management.  Full consent has been given.  DESCRIPTION OF PROCEDURE:  The patient was taken to the operating room and after proper identification and consents were ascertained, she was placed on the operating table in the supine position.  After the induction of general endotracheal anesthesia, she was placed in the [place] stirrups and the perineum and vagina were prepped and draped in a routine sterile fashion.  A transurethral Foley was placed and removed at the conclusion of the procedure. Bimanual examination confirmed an anterior mobile parous size uterus. The uterine cavity sounded to approximately 11 cm.  The internal os was easily patent to a #33 Pratt dilator.  The hysteroscope with continuous sorbitol infusion was then inserted under direct vision.  Preresection photographs were taken but due to the malfunction  of the instrument, the photographs were not preserved to be made into hard copies.  The uterine fundus anteroposteriorly had a large broad based polypoid mass protruding from it which was felt to be the base of the prolapsed polyp.  There were multiple other polypoid structures including lower uterine segment polyps that were only noted upon withdrawal of the scope.  Both cornual regions were  visualized.  The remainder of the endometrial cavity was atrophic in appearance.  The large fibroid of the fundus was resected going slightly into the myometrium but taking care not to over resect so as to perforate the uterus.  Multiple small and large pieces of tissue were removed.  The scope was withdrawn on several occasions so that the polyp forceps could be removed to remove pieces of tissue to afford better visualization.  The lower uterine segments were also resected.  The entire specimen was sent as a single specimen to pathology.  The hysteroscope was inserted at the conclusion of the procedure and although the surfaces were somewhat ragged in appearance there was no active bleeding and there were no other polypoid masses identified.  The scope was then withdrawn.  The tenaculum site was noted to be hemostatic.  There was no active bleeding per the cervix.  Net sorbitol intake 100 cc.  The patient was stable on transfer to the recovery room.  Estimated blood loss was less than 50 cc.  Specimens were sent to pathology.  The patient will be observed and discharged per anesthesia.  DISPOSITION:  She will be given routine discharge instructions and told to follow up in the office in four to six weeks time.  She is told to follow up sooner for excessive pain, fever, bleeding or other concerns.  She is to take Advil or Aleve as needed for cramping and to resume all of her routine medications. DD:  02/23/00 TD:  02/24/00 Job: [id number]

## 2010-10-09 NOTE — Op Note (Signed)
NAME:  Michelle Fuller, Michelle Fuller                      ACCOUNT NO.:  [id number]   MEDICAL RECORD NO.:  [id number]                   PATIENT TYPE:  AMB   LOCATION:  ENDO                                 FACILITY:  [facility]   PHYSICIAN:  Anselmo Rod, M.D.               DATE OF BIRTH:  1935-02-02   DATE OF PROCEDURE:  03/11/2003  DATE OF DISCHARGE:                                 OPERATIVE REPORT   PROCEDURE PERFORMED:  Esophagogastroduodenoscopy with biopsies.   ENDOSCOPIST:  Anselmo Rod, M.D.   INSTRUMENT USED:  Olympus video panendoscope.   INDICATION FOR PROCEDURE:  Intermittent dysphagia in a 76 year old white  female, rule out esophagitis, stricture, etc.  The patient had a barium  swallow done that showed a Schatzki's ring.   PREPROCEDURE PREPARATION:  Informed consent was procured from the patient.  The patient was fasted for eight hours prior to the procedure.   PREPROCEDURE PHYSICAL:  VITAL SIGNS:  The patient had stable vital signs.  NECK:  Neck supple.  CHEST:  Chest clear to auscultation.  S1 and S2 regular.  ABDOMEN:  Abdomen soft with normal bowel sounds.   DESCRIPTION OF PROCEDURE:  The patient was placed in the left lateral  decubitus position and sedated with 50 mg of Demerol and 5 mg of Versed  intravenously.  Once the patient was adequately sedate and maintained on low-  flow oxygen and continuous cardiac monitoring, the Olympus video  panendoscope was advanced through the mouthpiece and over the tongue into  the esophagus under direct vision.  The patient had some tortuosity in the  mid and distal esophagus with widely patent GE junction.  There was some  distal esophagitis noted as well.  Retroflexion in the high cardiac revealed  a hiatal hernia measuring 4 to 5 cm in size.  Three small sessile polyps  were seen in the proximal stomach; these were biopsied for pathology.  The  antrum and the mid-body appeared normal and so did the proximal small bowel.   IMPRESSION:  1. Tortuous distal esophagus with mild distal esophagitis.  2. Widely patent gastroesophageal junction, no dilatation required.  3. Polyps in the proximal stomach, biopsied for pathology.  4. Normal-appearing mid-body, antrum and proximal small bowel.   RECOMMENDATIONS:  1. Try the PPIs.  2. Avoid all nonsteroidals including aspirin for now.  3.     Await pathology results.  4. Outpatient followup in the next two weeks for further recommendations.  5. Liberal fluid intake with meals, chew food well.                                               Anselmo Rod, M.D.    JNM/MEDQ  D:  03/11/2003  T:  03/11/2003  Job:  [id number]   cc:  Laqueta Linden, M.D.  [address]  [place]  [place] [address]  Fax: [phone]

## 2010-12-28 ENCOUNTER — Other Ambulatory Visit: Payer: Self-pay | Admitting: Dermatology

## 2011-06-02 ENCOUNTER — Other Ambulatory Visit: Payer: Self-pay | Admitting: Dermatology

## 2012-05-24 HISTORY — PX: OTHER SURGICAL HISTORY: SHX169

## 2012-12-05 ENCOUNTER — Other Ambulatory Visit: Payer: Self-pay | Admitting: Dermatology

## 2013-01-15 ENCOUNTER — Emergency Department (HOSPITAL_COMMUNITY): Payer: Medicare Other

## 2013-01-15 ENCOUNTER — Observation Stay (HOSPITAL_COMMUNITY)
Admission: EM | Admit: 2013-01-15 | Discharge: 2013-01-16 | Disposition: A | Discharge status: Home / Self Care | Payer: Medicare Other | Attending: Internal Medicine | Admitting: Internal Medicine

## 2013-01-15 ENCOUNTER — Encounter (HOSPITAL_COMMUNITY): Payer: Self-pay | Admitting: Emergency Medicine

## 2013-01-15 DIAGNOSIS — I10 Essential (primary) hypertension: Secondary | ICD-10-CM | POA: Diagnosis present

## 2013-01-15 DIAGNOSIS — Z8673 Personal history of transient ischemic attack (TIA), and cerebral infarction without residual deficits: Secondary | ICD-10-CM | POA: Insufficient documentation

## 2013-01-15 DIAGNOSIS — Z853 Personal history of malignant neoplasm of breast: Secondary | ICD-10-CM | POA: Insufficient documentation

## 2013-01-15 DIAGNOSIS — I771 Stricture of artery: Secondary | ICD-10-CM | POA: Insufficient documentation

## 2013-01-15 DIAGNOSIS — H538 Other visual disturbances: Principal | ICD-10-CM | POA: Insufficient documentation

## 2013-01-15 DIAGNOSIS — H539 Unspecified visual disturbance: Secondary | ICD-10-CM

## 2013-01-15 DIAGNOSIS — Z7982 Long term (current) use of aspirin: Secondary | ICD-10-CM | POA: Insufficient documentation

## 2013-01-15 DIAGNOSIS — I679 Cerebrovascular disease, unspecified: Secondary | ICD-10-CM | POA: Diagnosis present

## 2013-01-15 DIAGNOSIS — Z79899 Other long term (current) drug therapy: Secondary | ICD-10-CM | POA: Insufficient documentation

## 2013-01-15 DIAGNOSIS — E785 Hyperlipidemia, unspecified: Secondary | ICD-10-CM | POA: Diagnosis present

## 2013-01-15 DIAGNOSIS — R51 Headache: Secondary | ICD-10-CM | POA: Insufficient documentation

## 2013-01-15 DIAGNOSIS — G319 Degenerative disease of nervous system, unspecified: Secondary | ICD-10-CM | POA: Insufficient documentation

## 2013-01-15 DIAGNOSIS — G250 Essential tremor: Secondary | ICD-10-CM | POA: Diagnosis present

## 2013-01-15 HISTORY — DX: Hyperlipidemia, unspecified: E78.5

## 2013-01-15 HISTORY — DX: Essential tremor: G25.0

## 2013-01-15 HISTORY — DX: Malignant neoplasm of unspecified site of unspecified female breast: C50.919

## 2013-01-15 HISTORY — DX: Essential (primary) hypertension: I10

## 2013-01-15 LAB — CBC
HCT: 43.7 % (ref 36.0–46.0)
MCHC: 35.5 g/dL (ref 30.0–36.0)
MCV: 86.2 fL (ref 78.0–100.0)
Platelets: 221 10*3/uL (ref 150–400)
RDW: 13.1 % (ref 11.5–15.5)
WBC: 7.5 10*3/uL (ref 4.0–10.5)

## 2013-01-15 LAB — RAPID URINE DRUG SCREEN, HOSP PERFORMED
Amphetamines: NOT DETECTED
Barbiturates: POSITIVE — AB
Benzodiazepines: NOT DETECTED
Cocaine: NOT DETECTED
Opiates: NOT DETECTED
Tetrahydrocannabinol: NOT DETECTED

## 2013-01-15 LAB — DIFFERENTIAL
Basophils Absolute: 0 10*3/uL (ref 0.0–0.1)
Basophils Relative: 1 % (ref 0–1)
Eosinophils Absolute: 0.2 10*3/uL (ref 0.0–0.7)
Eosinophils Relative: 2 % (ref 0–5)
Monocytes Absolute: 1.2 10*3/uL — ABNORMAL HIGH (ref 0.1–1.0)

## 2013-01-15 LAB — POCT I-STAT TROPONIN I: Troponin i, poc: 0.01 ng/mL (ref 0.00–0.08)

## 2013-01-15 LAB — PROTIME-INR: Prothrombin Time: 12.6 seconds (ref 11.6–15.2)

## 2013-01-15 LAB — APTT: aPTT: 33 seconds (ref 24–37)

## 2013-01-15 NOTE — ED Notes (Signed)
Pt presents to the ED with a complaint that her eyes are "fluttering."  Pt states she was watching TV when her eyes began to flutter uncontrollably.  Pt has had cataract surgery in the Spring of this year with no complications.  Pt's eye are not visibly fluttering at this time.

## 2013-01-15 NOTE — ED Notes (Signed)
Pt ambulatory to exam room with steady gait.  

## 2013-01-15 NOTE — ED Notes (Signed)
Pt in CT.

## 2013-01-15 NOTE — ED Provider Notes (Signed)
CSN: [id number]     Arrival date & time 01/15/13  2213 History  This chart was scribed for Junious Silk, PA working with Dagmar Hait, MD by Quintella Reichert, ED Scribe. This patient was seen in room WTR6/WTR6 and the patient's care was started at 10:35 PM.    Chief Complaint  Patient presents with  . Eye Problem    The history is provided by the patient. No language interpreter was used.    HPI Comments: Michelle Fuller is a 78 y.o. female with h/o HTN and hyperlipidemia who presents to the Emergency Department complaining of a brief episode of visual disturbance that occurred one hour ago.  Pt states she was watching TV when her vision suddenly became blurred and her eyes "were jumping all around."  This episode lasted for one minute.  It was not associated with extremity weakness, headache, lightheadedness, aphasia, difficulty walking, or loss of coordination.  She contacted her ophthalmologist but he was out of the office.  Pt notes that her vision sometimes becomes slightly blurry when her "eyes are tired" but states that this episode felt distinct.  She denies h/o stroke or TIA.  She had cataract surgery several months ago at [facility] and states that she had to be briefly admitted for postoperative hypotension.  She states that this resolved in the hospital and she was discharged without a firm diagnose as to its cause.  Pt also states that she has had a brain MRI done recently which was unremarkable.  Ophthalmologist is Dr. Dagoberto Ligas   Past Medical History  Diagnosis Date  . Hypertension     Past Surgical History  Procedure Laterality Date  . Mastectomy, radical      No family history on file.   History  Substance Use Topics  . Smoking status: Not on file  . Smokeless tobacco: Not on file  . Alcohol Use: Not on file    OB History   Grav Para Term Preterm Abortions TAB SAB Ect Mult Living                   Review of Systems  Eyes: Positive  for visual disturbance.  Neurological: Negative for syncope, facial asymmetry, speech difficulty, weakness, light-headedness and headaches.  All other systems reviewed and are negative.      Allergies  Penicillins  Home Medications   Current Outpatient Rx  Name  Route  Sig  Dispense  Refill  . amLODipine (NORVASC) 5 MG tablet   Oral   Take 5 mg by mouth daily.         Marland Kitchen atorvastatin (LIPITOR) 10 MG tablet   Oral   Take 10 mg by mouth daily at 12 noon.         . calcium-vitamin D (OSCAL WITH D) 500-200 MG-UNIT per tablet   Oral   Take 2 tablets by mouth 2 (two) times daily.         . indapamide (LOZOL) 1.25 MG tablet   Oral   Take 1.25 mg by mouth every morning.         . Multiple Vitamin (MULTIVITAMIN WITH MINERALS) TABS tablet   Oral   Take 1 tablet by mouth daily at 12 noon.         . primidone (MYSOLINE) 50 MG tablet   Oral   Take 150 mg by mouth daily at 12 noon.         . propranolol ER (INDERAL LA) 160 MG SR  capsule   Oral   Take by mouth daily.         . raloxifene (EVISTA) 60 MG tablet   Oral   Take 60 mg by mouth daily at 12 noon.         . valsartan (DIOVAN) 320 MG tablet   Oral   Take 320 mg by mouth daily.          BP 136/72  Pulse 73  Temp(Src) 98.8 F (37.1 C)  Resp 18  SpO2 95%  Physical Exam  Nursing note and vitals reviewed. Constitutional: She is oriented to person, place, and time. She appears well-developed and well-nourished. No distress.  HENT:  Head: Normocephalic and atraumatic.  Right Ear: External ear normal.  Left Ear: External ear normal.  Nose: Nose normal.  Mouth/Throat: Oropharynx is clear and moist.  Eyes: Conjunctivae and EOM are normal. Pupils are equal, round, and reactive to light.  Neck: Normal range of motion.  Cardiovascular: Normal rate, regular rhythm and normal heart sounds.   Pulmonary/Chest: Effort normal and breath sounds normal. No stridor. No respiratory distress. She has no  wheezes. She has no rales.  Abdominal: Soft. She exhibits no distension.  Musculoskeletal: Normal range of motion.  Neurological: She is alert and oriented to person, place, and time. She has normal strength. No cranial nerve deficit (III-XII) or sensory deficit. Coordination normal.  Essential tremor  Skin: Skin is warm and dry. She is not diaphoretic. No erythema.  Psychiatric: She has a normal mood and affect. Her behavior is normal.    ED Course  Procedures (including critical care time)  DIAGNOSTIC STUDIES: Oxygen Saturation is 95% on room air, adequate by my interpretation.    COORDINATION OF CARE: 10:43 PM-Discussed treatment plan which includes evaluation by attending physician with pt at bedside and pt agreed to plan.     Labs Review Labs Reviewed  CBC - Abnormal; Notable for the following:    Hemoglobin 15.5 (*)    All other components within normal limits  DIFFERENTIAL - Abnormal; Notable for the following:    Monocytes Relative 17 (*)    Monocytes Absolute 1.2 (*)    All other components within normal limits  COMPREHENSIVE METABOLIC PANEL - Abnormal; Notable for the following:    Sodium 133 (*)    Glucose, Bld 102 (*)    GFR calc non Af Amer 79 (*)    All other components within normal limits  URINE RAPID DRUG SCREEN (HOSP PERFORMED) - Abnormal; Notable for the following:    Barbiturates POSITIVE (*)    All other components within normal limits  URINALYSIS, ROUTINE W REFLEX MICROSCOPIC - Abnormal; Notable for the following:    Leukocytes, UA SMALL (*)    All other components within normal limits  PROTIME-INR  APTT  TROPONIN I  GLUCOSE, CAPILLARY  URINE MICROSCOPIC-ADD ON  HEMOGLOBIN A1C  LIPID PANEL  CBC  CREATININE, SERUM  POCT I-STAT TROPONIN I   11:47 PM I discussed case with patient's PCP, Dr. Casimiro Needle Altheimer who reports that he does not have a great deal of information about the patient's prior workups as he just got a new computer system. He is  aware of the current situation and likely hospital admission. He agrees with plan.    Imaging Review Ct Head Wo Contrast  01/16/2013   *RADIOLOGY REPORT*  Clinical Data: Severe headache  CT HEAD WITHOUT CONTRAST  Technique:  Contiguous axial images were obtained from the base of the skull through  the vertex without contrast.  Comparison: 05/30/2010  Findings:  Redemonstrated atrophy with sulcal prominence and prominence of the bifrontal extra-axial spaces, right slightly greater than left. Scattered periventricular hypodensities compatible microvascular ischemic disease.  Old right cerebellar infarct, grossly unchanged. Given background parenchymal abnormalities, there is no CT evidence of acute large territory infarct.  No intraparenchymal or extra- axial mass or hemorrhage.  Unchanged size configuration of the ventricles and basilar cisterns.  No midline shift.  Limited visualization of the paranasal sinuses and mastoid air cells are normal.  Regional soft tissues are normal.  No displaced calvarial fracture.  Post bilateral cataract surgery.  IMPRESSION: 1.  Stable findings of atrophy and microvascular ischemic disease without acute intracranial process. 2.  Old left-sided cerebellar infarct.   Original Report Authenticated By: Tacey Ruiz, MD    MDM  No diagnosis found. Patient with 1.5 minute episode of blurry vision bilaterally. Concern for TIA. CT shows old left-sided cerebellar infarct which patient is unaware of. Labs are otherwise unremarkable. Neuro exam is WNL. I discussed this case with Dr. Darnelle Catalan who agrees with admit for TIA r/o. Admission is appreciated. Dr. Gwendolyn Grant evaluated this patient and agrees with plan. Patient / Family / Caregiver informed of clinical course, understand medical decision-making process, and agree with plan.     I personally performed the services described in this documentation, which was scribed in my presence. The recorded information has been reviewed and is  accurate.    Mora Bellman, PA-C 01/16/13 [phone]

## 2013-01-16 ENCOUNTER — Encounter (HOSPITAL_COMMUNITY): Payer: Self-pay | Admitting: Internal Medicine

## 2013-01-16 ENCOUNTER — Observation Stay (HOSPITAL_COMMUNITY): Payer: Medicare Other

## 2013-01-16 DIAGNOSIS — I10 Essential (primary) hypertension: Secondary | ICD-10-CM

## 2013-01-16 DIAGNOSIS — E785 Hyperlipidemia, unspecified: Secondary | ICD-10-CM

## 2013-01-16 DIAGNOSIS — I679 Cerebrovascular disease, unspecified: Secondary | ICD-10-CM | POA: Diagnosis present

## 2013-01-16 DIAGNOSIS — H539 Unspecified visual disturbance: Secondary | ICD-10-CM

## 2013-01-16 DIAGNOSIS — I517 Cardiomegaly: Secondary | ICD-10-CM

## 2013-01-16 DIAGNOSIS — G250 Essential tremor: Secondary | ICD-10-CM

## 2013-01-16 LAB — CBC
Hemoglobin: 14.8 g/dL (ref 12.0–15.0)
MCH: 30.6 pg (ref 26.0–34.0)
MCHC: 35.5 g/dL (ref 30.0–36.0)
MCV: 86.3 fL (ref 78.0–100.0)
RBC: 4.83 MIL/uL (ref 3.87–5.11)

## 2013-01-16 LAB — COMPREHENSIVE METABOLIC PANEL
ALT: 24 U/L (ref 0–35)
AST: 33 U/L (ref 0–37)
Albumin: 3.6 g/dL (ref 3.5–5.2)
Calcium: 9.5 mg/dL (ref 8.4–10.5)
Creatinine, Ser: 0.78 mg/dL (ref 0.50–1.10)
GFR calc non Af Amer: 79 mL/min — ABNORMAL LOW (ref >90)
Sodium: 133 mEq/L — ABNORMAL LOW (ref 135–145)
Total Protein: 7.1 g/dL (ref 6.0–8.3)

## 2013-01-16 LAB — LIPID PANEL
Cholesterol: 154 mg/dL (ref 0–200)
Total CHOL/HDL Ratio: 4.3 RATIO
Triglycerides: 197 mg/dL — ABNORMAL HIGH (ref <150)

## 2013-01-16 LAB — URINALYSIS, ROUTINE W REFLEX MICROSCOPIC
Bilirubin Urine: NEGATIVE
Hgb urine dipstick: NEGATIVE
Ketones, ur: NEGATIVE mg/dL
Specific Gravity, Urine: 1.015 (ref 1.005–1.030)
Urobilinogen, UA: 0.2 mg/dL (ref 0.0–1.0)

## 2013-01-16 LAB — URINE MICROSCOPIC-ADD ON

## 2013-01-16 LAB — HEMOGLOBIN A1C: Mean Plasma Glucose: 117 mg/dL — ABNORMAL HIGH (ref <117)

## 2013-01-16 MED ORDER — RALOXIFENE HCL 60 MG PO TABS
60.0000 mg | ORAL_TABLET | Freq: Every day | ORAL | Status: DC
  Administered 2013-01-16: 60 mg via ORAL
  Filled 2013-01-16: qty 1

## 2013-01-16 MED ORDER — CALCIUM CARBONATE-VITAMIN D 500-200 MG-UNIT PO TABS
2.0000 | ORAL_TABLET | Freq: Two times a day (BID) | ORAL | Status: DC
  Administered 2013-01-16: 2 via ORAL
  Filled 2013-01-16 (×2): qty 2

## 2013-01-16 MED ORDER — PRIMIDONE 50 MG PO TABS
150.0000 mg | ORAL_TABLET | Freq: Every day | ORAL | Status: DC
  Administered 2013-01-16: 150 mg via ORAL
  Filled 2013-01-16: qty 3

## 2013-01-16 MED ORDER — ENOXAPARIN SODIUM 40 MG/0.4ML ~~LOC~~ SOLN
40.0000 mg | SUBCUTANEOUS | Status: DC
  Administered 2013-01-16: 40 mg via SUBCUTANEOUS
  Filled 2013-01-16: qty 0.4

## 2013-01-16 MED ORDER — ACETAMINOPHEN 325 MG PO TABS: 650.0000 mg | ORAL_TABLET | ORAL | Status: DC | PRN

## 2013-01-16 MED ORDER — INDAPAMIDE 1.25 MG PO TABS
1.2500 mg | ORAL_TABLET | Freq: Every day | ORAL | Status: DC
  Administered 2013-01-16: 1.25 mg via ORAL
  Filled 2013-01-16: qty 1

## 2013-01-16 MED ORDER — SODIUM CHLORIDE 0.9 % IJ SOLN: 3.0000 mL | INTRAMUSCULAR | Status: DC | PRN

## 2013-01-16 MED ORDER — SODIUM CHLORIDE 0.9 % IJ SOLN
3.0000 mL | Freq: Two times a day (BID) | INTRAMUSCULAR | Status: DC
  Administered 2013-01-16 (×2): 3 mL via INTRAVENOUS

## 2013-01-16 MED ORDER — PROPRANOLOL HCL ER 160 MG PO CP24
160.0000 mg | ORAL_CAPSULE | Freq: Every day | ORAL | Status: DC
  Administered 2013-01-16: 160 mg via ORAL
  Filled 2013-01-16: qty 1

## 2013-01-16 MED ORDER — ASPIRIN 81 MG PO TABS: 81.0000 mg | ORAL_TABLET | Freq: Every day | ORAL | Status: DC

## 2013-01-16 MED ORDER — AMLODIPINE BESYLATE 5 MG PO TABS
5.0000 mg | ORAL_TABLET | Freq: Every day | ORAL | Status: DC
  Administered 2013-01-16: 5 mg via ORAL
  Filled 2013-01-16: qty 1

## 2013-01-16 MED ORDER — IRBESARTAN 300 MG PO TABS
300.0000 mg | ORAL_TABLET | Freq: Every day | ORAL | Status: DC
  Administered 2013-01-16: 300 mg via ORAL
  Filled 2013-01-16: qty 1

## 2013-01-16 MED ORDER — ADULT MULTIVITAMIN W/MINERALS CH
1.0000 | ORAL_TABLET | Freq: Every day | ORAL | Status: DC
  Administered 2013-01-16: 1 via ORAL
  Filled 2013-01-16: qty 1

## 2013-01-16 MED ORDER — SODIUM CHLORIDE 0.9 % IV SOLN: 250.0000 mL | INTRAVENOUS | Status: DC | PRN

## 2013-01-16 MED ORDER — ATORVASTATIN CALCIUM 10 MG PO TABS
10.0000 mg | ORAL_TABLET | Freq: Every day | ORAL | Status: DC
  Administered 2013-01-16: 10 mg via ORAL
  Filled 2013-01-16: qty 1

## 2013-01-16 MED ORDER — ASPIRIN 325 MG PO TABS
325.0000 mg | ORAL_TABLET | Freq: Every day | ORAL | Status: DC
  Administered 2013-01-16: 325 mg via ORAL
  Filled 2013-01-16: qty 1

## 2013-01-16 NOTE — Progress Notes (Signed)
  Echocardiogram 2D Echocardiogram has been performed.  Michelle Fuller 01/16/2013, 8:50 AM 

## 2013-01-16 NOTE — Discharge Summary (Signed)
Physician Discharge Summary  Michelle Fuller JXB:[id number] DOB: 15-Mar-1935 DOA: 01/15/2013  PCP: Junious Silk, MD  Admit date: 01/15/2013 Discharge date: 01/16/2013  Time spent: 50 minutes  Recommendations for Outpatient Follow-up:  1. Follow up PCP in 2 weeks, re-evaluate for depression. 2. Follow up with Dr. Richardean Chimera  Discharge Diagnoses:  Principal Problem:   Visual disturbance, transient Active Problems:   Hypertension   Hyperlipidemia   Cerebrovascular disease   Essential tremor   Discharge Condition: stable  Diet recommendation: heart healthy  Filed Weights   01/16/13 0145  Weight: 65.817 kg (145 lb 1.6 oz)    History of present illness:  78 y.o. female with a PMH of hypertension, hyperlipidemia, breast cancer and essential tremor who was in her usual state of health until this evening, when she developed the sudden onset of "fluttering" vision, associated with blurry vision, lasting about 1-2 minutes. No dark vision or frank visual loss. No history of similar events. No other focal neurological deficits. No associated headache. Patient also states that she had cataract surgery about 2 months ago, but has not had any adverse effects from her surgery. Upon initial evaluation emergency department, the patient's neurological exam is completely normal but we have been asked to admit her to rule out TIA, especially given that her CT scan of the brain shows an old left sided cerebral infarction, of which the patient is unaware.   Hospital Course:  Visual disturbance, transient  - Fasting lipid panel HDL <40, diet and exercise. - MRI, MRA of the brain as below. - Echocardiogram follow up as an outpateint - Prophylactic therapy-Antiplatelet med: Aspirin - dose 81 mg PO daily  - will need to follow up with Neurologist for nystagmus as an outpatient.  Hypertension  -Continue usual home medications including Norvasc, propranolol, and Avapro.  - stable.  Hyperlipidemia   -Continue statin therapy.  - diet and exercise.  Essential tremor  -Continue Mysoline.   Procedures:  MRI  ECHO  Consultations:  none  Discharge Exam: Filed Vitals:   01/16/13 0431  BP: 131/62  Pulse: 62  Temp: 97.9 F (36.6 C)  Resp: 18    General: A&O x3 Cardiovascular: RRR Respiratory: good air movement CTA B/L  Discharge Instructions  Discharge Orders   Future Appointments Provider Department Dept Phone   08/03/2013 2:00 PM Annamaria Boots, MD [facility] [phone]   Future Orders Complete By Expires   Diet - low sodium heart healthy  As directed    Increase activity slowly  As directed        Medication List         amLODipine 5 MG tablet  Commonly known as:  NORVASC  Take 5 mg by mouth daily.     aspirin 81 MG tablet  Take 1 tablet (81 mg total) by mouth daily.     atorvastatin 10 MG tablet  Commonly known as:  LIPITOR  Take 10 mg by mouth daily at 12 noon.     calcium-vitamin D 500-200 MG-UNIT per tablet  Commonly known as:  OSCAL WITH D  Take 2 tablets by mouth 2 (two) times daily.     indapamide 1.25 MG tablet  Commonly known as:  LOZOL  Take 1.25 mg by mouth every morning.     multivitamin with minerals Tabs tablet  Take 1 tablet by mouth daily at 12 noon.     primidone 50 MG tablet  Commonly known as:  MYSOLINE  Take 150 mg by  mouth daily at 12 noon.     propranolol ER 160 MG SR capsule  Commonly known as:  INDERAL LA  Take by mouth daily.     raloxifene 60 MG tablet  Commonly known as:  EVISTA  Take 60 mg by mouth daily at 12 noon.     valsartan 320 MG tablet  Commonly known as:  DIOVAN  Take 320 mg by mouth daily.       Allergies  Allergen Reactions  . Penicillins     REACTION: "broke out"       Follow-up Information   Follow up with ALTHEIMER,MICHAEL D, MD In 2 weeks. (FOllow up in 2 weeks to re-evalaute for depression)    Specialty:  Endocrinology   Contact information:   [address] [place] [address] [phone]       Follow up with [facility], MD In 3 weeks. (hospital follow up)    Specialty:  Neurology   Contact information:   [address] [place] [address] [phone]        The results of significant diagnostics from this hospitalization (including imaging, microbiology, ancillary and laboratory) are listed below for reference.    Significant Diagnostic Studies: Ct Head Wo Contrast  01/16/2013   *RADIOLOGY REPORT*  Clinical Data: Severe headache  CT HEAD WITHOUT CONTRAST  Technique:  Contiguous axial images were obtained from the base of the skull through the vertex without contrast.  Comparison: 05/30/2010  Findings:  Redemonstrated atrophy with sulcal prominence and prominence of the bifrontal extra-axial spaces, right slightly greater than left. Scattered periventricular hypodensities compatible microvascular ischemic disease.  Old right cerebellar infarct, grossly unchanged. Given background parenchymal abnormalities, there is no CT evidence of acute large territory infarct.  No intraparenchymal or extra- axial mass or hemorrhage.  Unchanged size configuration of the ventricles and basilar cisterns.  No midline shift.  Limited visualization of the paranasal sinuses and mastoid air cells are normal.  Regional soft tissues are normal.  No displaced calvarial fracture.  Post bilateral cataract surgery.  IMPRESSION: 1.  Stable findings of atrophy and microvascular ischemic disease without acute intracranial process. 2.  Old left-sided cerebellar infarct.   Original Report Authenticated By: Tacey Ruiz, MD   Mr Maxine Glenn Head Wo Contrast  01/16/2013   CLINICAL DATA:  78 year old female with visual disturbance, severe headache, TIA.  EXAM: MRI HEAD WITHOUT CONTRAST  MRA HEAD WITHOUT CONTRAST  TECHNIQUE: Multiplanar, multiecho pulse sequences of the brain and surrounding structures were obtained without intravenous contrast.  Angiographic images of the head were obtained using MRA technique without contrast.  COMPARISON:  Head CTs without contrast 01/15/2013 and earlier.  FINDINGS: MRI HEAD FINDINGS  Stable cerebral volume. No restricted diffusion to suggest acute infarction. No midline shift, mass effect, evidence of mass lesion, ventriculomegaly, extra-axial collection or acute intracranial hemorrhage. Cervicomedullary junction and pituitary are within normal limits. Negative visualized cervical spine. Major intracranial vascular flow voids are preserved.  Minimal to mild for age scattered nonspecific subcortical white matter T2 and FLAIR hyperintensity. Small chronic lacunar infarct in the left cerebellar hemisphere. No other cortical encephalomalacia. Otherwise normal gray and white matter signal.  Postoperative changes to the globes. Visualized orbit soft tissues are within normal limits. Visualized paranasal sinuses and mastoids are clear. Grossly normal visualized internal auditory structures. Normal bone marrow signal. Negative scalp soft tissues.  MRA HEAD FINDINGS  Antegrade flow in the posterior circulation. Mildly dominant distal left vertebral artery, and the  distal right vertebral artery is diminutive the on the right PICA origin. Dominant left AICA.  Tortuous but otherwise negative basilar artery. SCA and right PCA origins are normal. Fetal type left PCA origin. Right posterior communicating artery is present. Bilateral PCA branches are within normal limits.  Antegrade flow in both ICA siphons. Tortuous cavernous ICAs. No ICA stenosis. Ophthalmic and posterior communicating artery origins are within normal limits.  Normal carotid termini, MCA and ACA origins. Anterior communicating artery and visualized ACA branches are within normal limits. Visualized bilateral MCA branches are within normal limits.  IMPRESSION: MRI HEAD IMPRESSION  1. No acute intracranial abnormality. 2. Mild for age chronic small vessel disease.  MRA  HEAD IMPRESSION  Mild intracranial artery tortuosity, otherwise negative intracranial MRA.   Electronically Signed   By: Augusto Gamble   On: 01/16/2013 08:43   Mri Brain Without Contrast  01/16/2013   CLINICAL DATA:  78 year old female with visual disturbance, severe headache, TIA.  EXAM: MRI HEAD WITHOUT CONTRAST  MRA HEAD WITHOUT CONTRAST  TECHNIQUE: Multiplanar, multiecho pulse sequences of the brain and surrounding structures were obtained without intravenous contrast. Angiographic images of the head were obtained using MRA technique without contrast.  COMPARISON:  Head CTs without contrast 01/15/2013 and earlier.  FINDINGS: MRI HEAD FINDINGS  Stable cerebral volume. No restricted diffusion to suggest acute infarction. No midline shift, mass effect, evidence of mass lesion, ventriculomegaly, extra-axial collection or acute intracranial hemorrhage. Cervicomedullary junction and pituitary are within normal limits. Negative visualized cervical spine. Major intracranial vascular flow voids are preserved.  Minimal to mild for age scattered nonspecific subcortical white matter T2 and FLAIR hyperintensity. Small chronic lacunar infarct in the left cerebellar hemisphere. No other cortical encephalomalacia. Otherwise normal gray and white matter signal.  Postoperative changes to the globes. Visualized orbit soft tissues are within normal limits. Visualized paranasal sinuses and mastoids are clear. Grossly normal visualized internal auditory structures. Normal bone marrow signal. Negative scalp soft tissues.  MRA HEAD FINDINGS  Antegrade flow in the posterior circulation. Mildly dominant distal left vertebral artery, and the distal right vertebral artery is diminutive the on the right PICA origin. Dominant left AICA.  Tortuous but otherwise negative basilar artery. SCA and right PCA origins are normal. Fetal type left PCA origin. Right posterior communicating artery is present. Bilateral PCA branches are within normal  limits.  Antegrade flow in both ICA siphons. Tortuous cavernous ICAs. No ICA stenosis. Ophthalmic and posterior communicating artery origins are within normal limits.  Normal carotid termini, MCA and ACA origins. Anterior communicating artery and visualized ACA branches are within normal limits. Visualized bilateral MCA branches are within normal limits.  IMPRESSION: MRI HEAD IMPRESSION  1. No acute intracranial abnormality. 2. Mild for age chronic small vessel disease.  MRA HEAD IMPRESSION  Mild intracranial artery tortuosity, otherwise negative intracranial MRA.   Electronically Signed   By: Augusto Gamble   On: 01/16/2013 08:43    Microbiology: No results found for this or any previous visit (from the past 240 hour(s)).   Labs: Basic Metabolic Panel:  Recent Labs Lab 01/15/13 2320 01/16/13 0356  NA 133*  --   K 3.7  --   CL 97  --   CO2 27  --   GLUCOSE 102*  --   BUN 17  --   CREATININE 0.78 0.74  CALCIUM 9.5  --    Liver Function Tests:  Recent Labs Lab 01/15/13 2320  AST 33  ALT 24  ALKPHOS 59  BILITOT 0.3  PROT 7.1  ALBUMIN 3.6   No results found for this basename: LIPASE, AMYLASE,  in the last 168 hours No results found for this basename: AMMONIA,  in the last 168 hours CBC:  Recent Labs Lab 01/15/13 2320 01/16/13 0356  WBC 7.5 6.3  NEUTROABS 3.9  --   HGB 15.5* 14.8  HCT 43.7 41.7  MCV 86.2 86.3  PLT 221 208   Cardiac Enzymes:  Recent Labs Lab 01/15/13 2320  TROPONINI <0.30   BNP: BNP (last 3 results) No results found for this basename: PROBNP,  in the last 8760 hours CBG:  Recent Labs Lab 01/15/13 2328  GLUCAP 99       Signed:  FELIZ ORTIZ, ABRAHAM  [facility] 01/16/2013, 11:12 AM

## 2013-01-16 NOTE — Progress Notes (Signed)
Patient discharge to home, d/c instructions and follow up appointments done and was given to the patient.PIV removed no s/s of swelling or infiltration noted.

## 2013-01-16 NOTE — Progress Notes (Signed)
*  PRELIMINARY RESULTS* Vascular Ultrasound Carotid Duplex (Doppler) has been completed.   Findings suggest 1-39% internal carotid artery stenosis without evidence of significant plaque morphology. Vertebral arteries are patent with antegrade flow.  01/16/2013 11:37 AM Gertie Fey, RVT, RDCS, RDMS

## 2013-01-16 NOTE — H&P (Signed)
[facility] History and Physical  Michelle Fuller ZOX:[id number] DOB: 04-Dec-1934 DOA: 01/15/2013  Referring provider: Junious Silk, PA-C PCP: Junious Silk, MD   Chief Complaint:    History of Present Illness: Michelle Fuller is an 78 y.o. female with a PMH of hypertension, hyperlipidemia, breast cancer and essential tremor who was in her usual state of health until this evening, when she developed the sudden onset of "fluttering" vision, associated with blurry vision, lasting about 1-2 minutes.  No dark vision or frank visual loss.  No history of similar events.  No other focal neurological deficits.  No associated headache. Patient also states that she had cataract surgery about 2 months ago, but has not had any adverse effects from her surgery. Upon initial evaluation emergency department, the patient's neurological exam is completely normal but we have been asked to admit her to rule out TIA, especially given that her CT scan of the brain shows an old left sided cerebral infarction, of which the patient is unaware.  Review of Systems: Constitutional: No fever, no chills;  Appetite normal; No weight loss, no weight gain, no fatigue.  HEENT: + blurry vision, no diplopia, no pharyngitis, no dysphagia CV: No chest pain, no palpitations.  Resp: No SOB, no cough. GI: No nausea, no vomiting, no diarrhea, no melena, no hematochezia.  GU: No dysuria, no hematuria. MSK: no myalgias, no arthralgias.  Neuro:  No headache, no focal neurological deficits except as noted above, no history of seizures.  Psych: No depression, no anxiety, + sleeping difficulties.  Endo: No heat intolerance, no cold intolerance, no polyuria, no polydipsia  Skin: No rashes, no skin lesions.  Heme: No easy bruising.  Past Medical History Past Medical History  Diagnosis Date  . Hypertension   . Breast cancer     Right breast, status post XRT  . Hyperlipidemia   . Essential tremor      Past Surgical  History Past Surgical History  Procedure Laterality Date  . Breast lumpectomy Right   . Bilateral cateract surgery       Social History: History   Social History  . Marital Status: Married    Spouse Name: N/A    Number of Children: 3  . Years of Education: N/A   Occupational History  . Retired Runner, broadcasting/film/video    Social History Main Topics  . Smoking status: Never Smoker   . Smokeless tobacco: Not on file  . Alcohol Use: Yes     Comment: Social: 1/2 glass red wine before dinner occasionally  . Drug Use: No  . Sexual Activity: Not on file   Other Topics Concern  . Not on file   Social History Narrative   Widowed.  Lives alone.  Ambulates without assistance.    Family History:  Family History  Problem Relation Age of Onset  . Heart disease Mother   . Alzheimer's disease Father     Allergies: Penicillins  Meds: Prior to Admission medications   Medication Sig Start Date End Date Taking? Authorizing Provider  amLODipine (NORVASC) 5 MG tablet Take 5 mg by mouth daily.   Yes Historical Provider, MD  atorvastatin (LIPITOR) 10 MG tablet Take 10 mg by mouth daily at 12 noon.   Yes Historical Provider, MD  calcium-vitamin D (OSCAL WITH D) 500-200 MG-UNIT per tablet Take 2 tablets by mouth 2 (two) times daily.   Yes Historical Provider, MD  indapamide (LOZOL) 1.25 MG tablet Take 1.25 mg by mouth every morning.   Yes  Historical Provider, MD  Multiple Vitamin (MULTIVITAMIN WITH MINERALS) TABS tablet Take 1 tablet by mouth daily at 12 noon.   Yes Historical Provider, MD  primidone (MYSOLINE) 50 MG tablet Take 150 mg by mouth daily at 12 noon.   Yes Historical Provider, MD  propranolol ER (INDERAL LA) 160 MG SR capsule Take by mouth daily.   Yes Historical Provider, MD  raloxifene (EVISTA) 60 MG tablet Take 60 mg by mouth daily at 12 noon.   Yes Historical Provider, MD  valsartan (DIOVAN) 320 MG tablet Take 320 mg by mouth daily.   Yes Historical Provider, MD    Physical  Exam: Filed Vitals:   01/15/13 2336 01/15/13 2345 01/16/13 0030 01/16/13 0036  BP:    136/72  Pulse:  75 70 73  Temp: 98.8 F (37.1 C)     Resp:  18 17 18   SpO2:  97% 96% 95%     Physical Exam: Blood pressure 136/72, pulse 73, temperature 98.8 F (37.1 C), resp. rate 18, SpO2 95.00%. Gen: No acute distress. Head: Normocephalic, atraumatic. Eyes: PERRL, EOMI, sclerae nonicteric. Visual fields full. Mouth: Oropharynx clear. Tongue midline. Palate rises symmetrically. Neck: Supple, no thyromegaly, no lymphadenopathy, no jugular venous distention. Chest: Lungs are clear to auscultation bilaterally. CV: Heart sounds are regular. No murmurs, rubs, or gallops. Abdomen: Soft, nontender, nondistended with normal active bowel sounds. Extremities: Extremities are without clubbing, edema, or cyanosis. Skin: Warm and dry. Neuro: Alert and oriented times 3; cranial nerves II through XII grossly intact. Psych: Mood and affect normal.  Labs on Admission:  Basic Metabolic Panel:  Recent Labs Lab 01/15/13 2320  NA 133*  K 3.7  CL 97  CO2 27  GLUCOSE 102*  BUN 17  CREATININE 0.78  CALCIUM 9.5   Liver Function Tests:  Recent Labs Lab 01/15/13 2320  AST 33  ALT 24  ALKPHOS 59  BILITOT 0.3  PROT 7.1  ALBUMIN 3.6   CBC:  Recent Labs Lab 01/15/13 2320  WBC 7.5  NEUTROABS 3.9  HGB 15.5*  HCT 43.7  MCV 86.2  PLT 221   Cardiac Enzymes:  Recent Labs Lab 01/15/13 2320  TROPONINI <0.30   CBG:  Recent Labs Lab 01/15/13 2328  GLUCAP 99    Radiological Exams on Admission: Ct Head Wo Contrast  01/16/2013   *RADIOLOGY REPORT*  Clinical Data: Severe headache  CT HEAD WITHOUT CONTRAST  Technique:  Contiguous axial images were obtained from the base of the skull through the vertex without contrast.  Comparison: 05/30/2010  Findings:  Redemonstrated atrophy with sulcal prominence and prominence of the bifrontal extra-axial spaces, right slightly greater than left.  Scattered periventricular hypodensities compatible microvascular ischemic disease.  Old right cerebellar infarct, grossly unchanged. Given background parenchymal abnormalities, there is no CT evidence of acute large territory infarct.  No intraparenchymal or extra- axial mass or hemorrhage.  Unchanged size configuration of the ventricles and basilar cisterns.  No midline shift.  Limited visualization of the paranasal sinuses and mastoid air cells are normal.  Regional soft tissues are normal.  No displaced calvarial fracture.  Post bilateral cataract surgery.  IMPRESSION: 1.  Stable findings of atrophy and microvascular ischemic disease without acute intracranial process. 2.  Old left-sided cerebellar infarct.   Original Report Authenticated By: Tacey Ruiz, MD    EKG: Not ordered. Will order.  Assessment/Plan Principal Problem:   Visual disturbance, transient -Although this could be related to her recent cataract surgery, need to rule out TIA in  this patient with evidence of an old cerebellar infarct on CT scan. -Visual disturbance completely resolved at present. Active Problems:   Hypertension -Continue usual home medications including Norvasc, propranolol, and Avapro.   Hyperlipidemia -Continue statin therapy. Check fasting lipid panel.   Cerebrovascular disease  -TIA workup per order set including MRI/MRA of the brain, carotid Dopplers, two-dimensional echocardiogram. -Full laboratory evaluation including hemoglobin A1c, fasting lipid panel. -Aspirin therapy ordered.   Essential tremor -Continue Mysoline.   Code Status: Full. Family Communication: Cala Kruckenberg (son). Disposition Plan: Home in 24 hours if stable.  Time spent: 1 hour.  Cherryl Babin [facility] Pager [phone]  If 7PM-7AM, please contact night-coverage [email or web address] Password [email or web address] 01/16/2013, 1:06 AM

## 2013-01-18 NOTE — ED Provider Notes (Signed)
Medical screening examination/treatment/procedure(s) were conducted as a shared visit with non-physician practitioner(s) and myself.  I personally evaluated the patient during the encounter  Patient presented with brief period of blurry vision. No trauma. Resolved at this time. No prior hx of TIA, however states she had a stroke workup previously at [facility]. Unable to view those records at this time. Neuro exam normal here, no cranial nerve deficits. Will admit for TIA r/o.  Dagmar Hait, MD 01/18/13 [phone]

## 2013-02-02 ENCOUNTER — Ambulatory Visit: Payer: Medicare Other | Admitting: Physical Therapy

## 2013-02-05 ENCOUNTER — Ambulatory Visit: Payer: Medicare Other | Attending: Endocrinology | Admitting: Physical Therapy

## 2013-02-05 DIAGNOSIS — IMO0001 Reserved for inherently not codable concepts without codable children: Secondary | ICD-10-CM | POA: Insufficient documentation

## 2013-02-05 DIAGNOSIS — R293 Abnormal posture: Secondary | ICD-10-CM | POA: Insufficient documentation

## 2013-02-05 DIAGNOSIS — M2550 Pain in unspecified joint: Secondary | ICD-10-CM | POA: Insufficient documentation

## 2013-02-05 DIAGNOSIS — M546 Pain in thoracic spine: Secondary | ICD-10-CM | POA: Insufficient documentation

## 2013-02-05 DIAGNOSIS — M2560 Stiffness of unspecified joint, not elsewhere classified: Secondary | ICD-10-CM | POA: Insufficient documentation

## 2013-02-12 ENCOUNTER — Ambulatory Visit: Payer: Medicare Other | Admitting: Physical Therapy

## 2013-02-15 ENCOUNTER — Encounter: Payer: Self-pay | Admitting: Neurology

## 2013-02-15 ENCOUNTER — Encounter: Payer: Medicare Other | Admitting: Rehabilitation

## 2013-02-15 ENCOUNTER — Ambulatory Visit (INDEPENDENT_AMBULATORY_CARE_PROVIDER_SITE_OTHER): Payer: Medicare Other | Admitting: Neurology

## 2013-02-15 VITALS — BP 168/82 | HR 89 | Ht 64.0 in | Wt 145.0 lb

## 2013-02-15 DIAGNOSIS — G250 Essential tremor: Secondary | ICD-10-CM

## 2013-02-15 DIAGNOSIS — H539 Unspecified visual disturbance: Secondary | ICD-10-CM

## 2013-02-15 MED ORDER — TOPIRAMATE 25 MG PO TABS: 25.0000 mg | ORAL_TABLET | Freq: Every day | ORAL | Status: DC

## 2013-02-15 NOTE — Progress Notes (Signed)
Reason for visit: Tremor  Michelle Fuller is a 78 y.o. female  History of present illness:  Michelle Fuller is a 78 year old right-handed white female with a history of a benign essential tremor. The patient was last seen through this office by Dr. Terrace Arabia, and she has been treated with propranolol and Mysoline. The patient indicates that at the current dose of Mysoline at 150 mg at night, she has a lot of problems with drowsiness and fatigue in the morning. Over the years, the tremor has gradually worsened. The tremor affects the head and neck, voice, and arms. The patient is having increasing problems with functioning. The patient recently was in the hospital after a transient event of visual alteration with blurring of vision and lines through the vision. This lasted 2 minutes. The patient went to the hospital and underwent a thorough stroke workup that included a carotid Doppler study that was unremarkable, and a 2-D echocardiogram was unremarkable with an ejection fraction of 55-60%. MRI of the brain showed an old left lacunar infarct of the cerebellum, and mild small vessel disease. MRA of the head was unremarkable. The patient was placed on low-dose aspirin, but she never continued this. The patient has a history of dyslipidemia, hypertension, and she is on Evista. The patient does not smoke. The patient is sent to this office for an evaluation. The patient gives no prior history of migraine headaches or similar visual field changes. The patient did not have headache with the event of visual alteration described above. The patient reported no numbness or weakness of the face, arms, or legs, slurred speech, problems swallowing, or gait instability.  Past Medical History  Diagnosis Date  . Hypertension   . Breast cancer     Right breast, status post XRT  . Hyperlipidemia   . Essential tremor   . Depression     Past Surgical History  Procedure Laterality Date  . Breast lumpectomy Right   .  Bilateral cateract surgery      Family History  Problem Relation Age of Onset  . Heart disease Mother   . Alzheimer's disease Father     Social history:  reports that she has never smoked. She does not have any smokeless tobacco history on file. She reports that  drinks alcohol. She reports that she does not use illicit drugs.  Medications:  Current Outpatient Prescriptions on File Prior to Visit  Medication Sig Dispense Refill  . aspirin 81 MG tablet Take 1 tablet (81 mg total) by mouth daily.  30 tablet    . atorvastatin (LIPITOR) 10 MG tablet Take 10 mg by mouth daily at 12 noon.      . calcium-vitamin D (OSCAL WITH D) 500-200 MG-UNIT per tablet Take 2 tablets by mouth 2 (two) times daily.      . indapamide (LOZOL) 1.25 MG tablet Take 1.25 mg by mouth every morning.      . Multiple Vitamin (MULTIVITAMIN WITH MINERALS) TABS tablet Take 1 tablet by mouth daily at 12 noon.      . primidone (MYSOLINE) 50 MG tablet Take 150 mg by mouth daily at 12 noon.      . propranolol ER (INDERAL LA) 160 MG SR capsule Take by mouth daily.      . raloxifene (EVISTA) 60 MG tablet Take 60 mg by mouth daily at 12 noon.      . valsartan (DIOVAN) 320 MG tablet Take 320 mg by mouth daily.  No current facility-administered medications on file prior to visit.      Allergies  Allergen Reactions  . Latex Rash  . Penicillins     REACTION: "broke out"    ROS:  Out of a complete 14 system review of symptoms, the patient complains only of the following symptoms, and all other reviewed systems are negative.  Tremor Depression, decreased energy, disinterest in activities  Blood pressure 168/82, pulse 89, height 5\' 4"  (1.626 m), weight 145 lb (65.772 kg).  Physical Exam  General: The patient is alert and cooperative at the time of the examination.  Head: Pupils are equal, round, and reactive to light. Discs are flat bilaterally.  Neck: The neck is supple, no carotid bruits are  noted.  Respiratory: The respiratory examination is clear.  Cardiovascular: The cardiovascular examination reveals a regular rate and rhythm, no obvious murmurs or rubs are noted.  Skin: Extremities are without significant edema.  Neurologic Exam  Mental status:  Cranial nerves: Facial symmetry is present. There is good sensation of the face to pinprick and soft touch bilaterally. The strength of the facial muscles and the muscles to head turning and shoulder shrug are normal bilaterally. Speech is well enunciated, no aphasia or dysarthria is noted. Extraocular movements are full. Visual fields are full. The patient does have a side-to-side head tremor, and a vocal tremor.  Motor: The motor testing reveals 5 over 5 strength of all 4 extremities. Good symmetric motor tone is noted throughout.  Sensory: Sensory testing is intact to pinprick, soft touch, vibration sensation, and position sense on all 4 extremities. No evidence of extinction is noted.  Coordination: Cerebellar testing reveals good finger-nose-finger and heel-to-shin bilaterally. A mild intention tremor seen with the upper extremities with finger-nose-finger.  Gait and station: Gait is normal. Tandem gait is normal. Romberg is negative. No drift is seen.  Reflexes: Deep tendon reflexes are symmetric and normal bilaterally. Toes are downgoing bilaterally.   Assessment/Plan:  One. Benign essential tremor  2. Transient visual alteration, etiology unclear  The patient will continue low-dose aspirin at this point. The patient is on Mysoline, but she indicates that the current dose is barely tolerated. The patient will be placed on low-dose Topamax to see if this helps the tremor. The patient has been on clonazepam in the past. The patient will followup through this office in 6 months. The patient will contact me if there are tolerance issues with the medication.  Marlan Palau MD 02/15/2013 9:02 PM  [facility] [address] [place], [place] [address]  Phone [phone] Fax [phone]

## 2013-02-16 ENCOUNTER — Encounter: Payer: Self-pay | Admitting: Obstetrics & Gynecology

## 2013-02-19 ENCOUNTER — Ambulatory Visit: Payer: Medicare Other | Admitting: Physical Therapy

## 2013-02-22 ENCOUNTER — Encounter: Payer: Medicare Other | Admitting: Physical Therapy

## 2013-02-26 ENCOUNTER — Ambulatory Visit: Payer: Medicare Other | Attending: Endocrinology | Admitting: Physical Therapy

## 2013-02-26 DIAGNOSIS — R293 Abnormal posture: Secondary | ICD-10-CM | POA: Insufficient documentation

## 2013-02-26 DIAGNOSIS — M2560 Stiffness of unspecified joint, not elsewhere classified: Secondary | ICD-10-CM | POA: Insufficient documentation

## 2013-02-26 DIAGNOSIS — M546 Pain in thoracic spine: Secondary | ICD-10-CM | POA: Insufficient documentation

## 2013-02-26 DIAGNOSIS — IMO0001 Reserved for inherently not codable concepts without codable children: Secondary | ICD-10-CM | POA: Insufficient documentation

## 2013-02-26 DIAGNOSIS — M2550 Pain in unspecified joint: Secondary | ICD-10-CM | POA: Insufficient documentation

## 2013-03-30 ENCOUNTER — Emergency Department (HOSPITAL_COMMUNITY): Payer: Medicare Other

## 2013-03-30 ENCOUNTER — Inpatient Hospital Stay (HOSPITAL_COMMUNITY)
Admission: EM | Admit: 2013-03-30 | Discharge: 2013-04-01 | DRG: 392 | Disposition: A | Discharge status: Home / Self Care | Payer: Medicare Other | Attending: Internal Medicine | Admitting: Internal Medicine

## 2013-03-30 ENCOUNTER — Encounter (HOSPITAL_COMMUNITY): Payer: Self-pay | Admitting: Emergency Medicine

## 2013-03-30 DIAGNOSIS — I10 Essential (primary) hypertension: Secondary | ICD-10-CM

## 2013-03-30 DIAGNOSIS — D72829 Elevated white blood cell count, unspecified: Secondary | ICD-10-CM | POA: Diagnosis present

## 2013-03-30 DIAGNOSIS — Z880 Allergy status to penicillin: Secondary | ICD-10-CM

## 2013-03-30 DIAGNOSIS — Z79899 Other long term (current) drug therapy: Secondary | ICD-10-CM

## 2013-03-30 DIAGNOSIS — R55 Syncope and collapse: Secondary | ICD-10-CM

## 2013-03-30 DIAGNOSIS — I679 Cerebrovascular disease, unspecified: Secondary | ICD-10-CM

## 2013-03-30 DIAGNOSIS — Z7982 Long term (current) use of aspirin: Secondary | ICD-10-CM

## 2013-03-30 DIAGNOSIS — Z923 Personal history of irradiation: Secondary | ICD-10-CM

## 2013-03-30 DIAGNOSIS — Q619 Cystic kidney disease, unspecified: Secondary | ICD-10-CM

## 2013-03-30 DIAGNOSIS — Z853 Personal history of malignant neoplasm of breast: Secondary | ICD-10-CM

## 2013-03-30 DIAGNOSIS — K5732 Diverticulitis of large intestine without perforation or abscess without bleeding: Principal | ICD-10-CM | POA: Diagnosis present

## 2013-03-30 DIAGNOSIS — Z91040 Latex allergy status: Secondary | ICD-10-CM

## 2013-03-30 DIAGNOSIS — H539 Unspecified visual disturbance: Secondary | ICD-10-CM

## 2013-03-30 DIAGNOSIS — E785 Hyperlipidemia, unspecified: Secondary | ICD-10-CM

## 2013-03-30 DIAGNOSIS — G250 Essential tremor: Secondary | ICD-10-CM

## 2013-03-30 DIAGNOSIS — N83209 Unspecified ovarian cyst, unspecified side: Secondary | ICD-10-CM | POA: Diagnosis present

## 2013-03-30 DIAGNOSIS — K5792 Diverticulitis of intestine, part unspecified, without perforation or abscess without bleeding: Secondary | ICD-10-CM

## 2013-03-30 HISTORY — DX: Diverticulosis of intestine, part unspecified, without perforation or abscess without bleeding: K57.90

## 2013-03-30 LAB — CG4 I-STAT (LACTIC ACID): Lactic Acid, Venous: 0.77 mmol/L (ref 0.5–2.2)

## 2013-03-30 LAB — URINALYSIS, ROUTINE W REFLEX MICROSCOPIC
Hgb urine dipstick: NEGATIVE
Nitrite: NEGATIVE
Protein, ur: NEGATIVE mg/dL
Urobilinogen, UA: 0.2 mg/dL (ref 0.0–1.0)

## 2013-03-30 LAB — COMPREHENSIVE METABOLIC PANEL
ALT: 17 U/L (ref 0–35)
BUN: 12 mg/dL (ref 6–23)
CO2: 25 mEq/L (ref 19–32)
Calcium: 9.8 mg/dL (ref 8.4–10.5)
Creatinine, Ser: 0.92 mg/dL (ref 0.50–1.10)
GFR calc Af Amer: 68 mL/min — ABNORMAL LOW (ref >90)
GFR calc non Af Amer: 59 mL/min — ABNORMAL LOW (ref >90)
Glucose, Bld: 109 mg/dL — ABNORMAL HIGH (ref 70–99)

## 2013-03-30 LAB — CBC WITH DIFFERENTIAL/PLATELET
Eosinophils Absolute: 0.1 10*3/uL (ref 0.0–0.7)
Eosinophils Relative: 1 % (ref 0–5)
HCT: 44.1 % (ref 36.0–46.0)
Lymphocytes Relative: 12 % (ref 12–46)
Lymphs Abs: 1.5 10*3/uL (ref 0.7–4.0)
MCH: 30.7 pg (ref 26.0–34.0)
MCV: 87.3 fL (ref 78.0–100.0)
Monocytes Absolute: 1.5 10*3/uL — ABNORMAL HIGH (ref 0.1–1.0)
Monocytes Relative: 12 % (ref 3–12)
RBC: 5.05 MIL/uL (ref 3.87–5.11)
WBC: 12.4 10*3/uL — ABNORMAL HIGH (ref 4.0–10.5)

## 2013-03-30 LAB — POCT I-STAT TROPONIN I: Troponin i, poc: 0.06 ng/mL (ref 0.00–0.08)

## 2013-03-30 LAB — URINE MICROSCOPIC-ADD ON

## 2013-03-30 LAB — LIPASE, BLOOD: Lipase: 46 U/L (ref 11–59)

## 2013-03-30 MED ORDER — METRONIDAZOLE IN NACL 5-0.79 MG/ML-% IV SOLN
500.0000 mg | Freq: Once | INTRAVENOUS | Status: AC
  Administered 2013-03-30: 500 mg via INTRAVENOUS
  Filled 2013-03-30: qty 100

## 2013-03-30 MED ORDER — ZOLPIDEM TARTRATE 5 MG PO TABS
5.0000 mg | ORAL_TABLET | Freq: Every evening | ORAL | Status: DC | PRN
  Administered 2013-04-01: 5 mg via ORAL
  Filled 2013-03-30: qty 1

## 2013-03-30 MED ORDER — CIPROFLOXACIN IN D5W 400 MG/200ML IV SOLN
400.0000 mg | Freq: Two times a day (BID) | INTRAVENOUS | Status: DC
  Administered 2013-03-31: 400 mg via INTRAVENOUS
  Filled 2013-03-30 (×2): qty 200

## 2013-03-30 MED ORDER — METRONIDAZOLE IN NACL 5-0.79 MG/ML-% IV SOLN
500.0000 mg | Freq: Three times a day (TID) | INTRAVENOUS | Status: DC
  Administered 2013-03-31: 500 mg via INTRAVENOUS
  Filled 2013-03-30 (×3): qty 100

## 2013-03-30 MED ORDER — ONDANSETRON HCL 4 MG PO TABS: 4.0000 mg | ORAL_TABLET | Freq: Four times a day (QID) | ORAL | Status: DC | PRN

## 2013-03-30 MED ORDER — ONDANSETRON HCL 4 MG/2ML IJ SOLN
4.0000 mg | Freq: Once | INTRAMUSCULAR | Status: AC
  Administered 2013-03-30: 4 mg via INTRAVENOUS
  Filled 2013-03-30: qty 2

## 2013-03-30 MED ORDER — HYDROMORPHONE HCL PF 1 MG/ML IJ SOLN: 0.5000 mg | INTRAMUSCULAR | Status: DC | PRN

## 2013-03-30 MED ORDER — ALUM & MAG HYDROXIDE-SIMETH 200-200-20 MG/5ML PO SUSP: 30.0000 mL | Freq: Four times a day (QID) | ORAL | Status: DC | PRN

## 2013-03-30 MED ORDER — IOHEXOL 300 MG/ML  SOLN
100.0000 mL | Freq: Once | INTRAMUSCULAR | Status: AC | PRN
  Administered 2013-03-30: 100 mL via INTRAVENOUS

## 2013-03-30 MED ORDER — SODIUM CHLORIDE 0.9 % IV BOLUS (SEPSIS)
500.0000 mL | Freq: Once | INTRAVENOUS | Status: AC
  Administered 2013-03-30: 500 mL via INTRAVENOUS

## 2013-03-30 MED ORDER — OXYCODONE HCL 5 MG PO TABS: 5.0000 mg | ORAL_TABLET | ORAL | Status: DC | PRN

## 2013-03-30 MED ORDER — SODIUM CHLORIDE 0.9 % IV SOLN
INTRAVENOUS | Status: DC
  Administered 2013-03-31: 75 mL/h via INTRAVENOUS

## 2013-03-30 MED ORDER — ACETAMINOPHEN 325 MG PO TABS: 650.0000 mg | ORAL_TABLET | Freq: Four times a day (QID) | ORAL | Status: DC | PRN

## 2013-03-30 MED ORDER — MORPHINE SULFATE 4 MG/ML IJ SOLN
4.0000 mg | Freq: Once | INTRAMUSCULAR | Status: AC
  Administered 2013-03-30: 4 mg via INTRAVENOUS
  Filled 2013-03-30: qty 1

## 2013-03-30 MED ORDER — ACETAMINOPHEN 650 MG RE SUPP: 650.0000 mg | Freq: Four times a day (QID) | RECTAL | Status: DC | PRN

## 2013-03-30 MED ORDER — ONDANSETRON HCL 4 MG/2ML IJ SOLN: 4.0000 mg | Freq: Four times a day (QID) | INTRAMUSCULAR | Status: DC | PRN

## 2013-03-30 MED ORDER — IOHEXOL 300 MG/ML  SOLN
50.0000 mL | Freq: Once | INTRAMUSCULAR | Status: AC | PRN
  Administered 2013-03-30: 50 mL via ORAL

## 2013-03-30 MED ORDER — CIPROFLOXACIN IN D5W 400 MG/200ML IV SOLN
400.0000 mg | Freq: Once | INTRAVENOUS | Status: AC
  Administered 2013-03-30: 400 mg via INTRAVENOUS
  Filled 2013-03-30: qty 200

## 2013-03-30 NOTE — ED Provider Notes (Addendum)
Medical screening examination/treatment/procedure(s) were conducted as a shared visit with non-physician practitioner(s) and myself.  I personally evaluated the patient during the encounter.  EKG Interpretation     Ventricular Rate:  71 PR Interval:  178 QRS Duration: 81 QT Interval:  414 QTC Calculation: 450 R Axis:   10 Text Interpretation:  Sinus rhythm Ventricular premature complex Otherwise within normal limits No previous tracing            Pt with elevated WBC, lower abd pain.  Pain was much worse last night.  No vomiting here.  Afebrile, not toxic appearing.  Mild tenderness in left and suprapubic lower abd, no rebound. CT shows acute diverticulitis, no perf or abscess.  Pt desires to try treatment as outpt with close outpt follow up.  Will give first dose of abx here in the ED.  Pt understands reasons to return such as sudden worsen pain, vomiting, high fevers.    Michelle Fuller. Seanne Chirico, MD 03/30/13 2016      9:13 PM Pt had a brief unresponsive episode and was found unresponsive by technician.  Pt is now more responsive, slowed, feels nauseated, was hypotensive.  2nd IV established, IVF's running.  Pt reportedly has had vagal syncope in the past.  I suspect pt may have had vasovagal syncope here.  Pt was just given morphine and zofran 20 minutes before, Cipro was just hung as well.  Will also watch carefully for rash, itching or other signs of allergic reaction.    Michelle Fuller. Oletta Lamas, MD 03/30/13 2115  Michelle Fuller. Avani Sensabaugh, MD 03/30/13 2322

## 2013-03-30 NOTE — ED Notes (Signed)
Patient became hypotensive after starting Cipro.  Dr Buelah Manis notified. Patient repositioned and NS bolus given.  Dr. Buelah Manis believes Patient possible vagaled and antibiotics were restarted

## 2013-03-30 NOTE — ED Notes (Signed)
Pt from home reports that she has had lower abd pain, nausea since yesterday. Pt denies emesis, diarrhea, CP, SOB.  Pt adds that this episode feels the same as when she had food poisoning before. Pt has hx of diverticulosis. Pt is A&O and in NAD

## 2013-03-30 NOTE — ED Notes (Signed)
Bed: WU98 Expected date:  Expected time:  Means of arrival:  Comments: Desma Maxim -triage 1

## 2013-03-30 NOTE — ED Provider Notes (Signed)
CSN: [id number]     Arrival date & time 03/30/13  1719 History   First MD Initiated Contact with Patient 03/30/13 1735     Chief Complaint  Patient presents with  . Abdominal Pain   (Consider location/radiation/quality/duration/timing/severity/associated sxs/prior Treatment) HPI Comments: Patient here with lower abdominal pain that started last night.  She states she ate some olives which were supposed to have been refrigerated and about 2-3 hours after this she began to notice lower abdominal pain.  She was fearful that this was food poisoning which she has had in the past and usually results in syncope due to the vomiting and diarrhea.  She reports nausea and anorexia with this but denies vomiting or diarrhea.  She called her PCP, Dr. Leslie Dales today, who referred her to the ED for further evaluation.  She reports that the pain has now localized to the LLQ is more intense, is associated with nausea but still no vomiting or diarrhea.  She has a history of diverticulosis but reports no previous diverticulitis.  Denies abdominal surgeries at this time.  Also denies chest pain, shortness of breath, syncope, blood in stool, melena, dysuria, hematuria or vaginal bleeding.  Patient is a 78 y.o. female presenting with abdominal pain. The history is provided by the patient. No language interpreter was used.  Abdominal Pain Pain location:  LLQ Pain quality: dull and gnawing   Pain radiates to:  Does not radiate Pain severity:  Moderate Onset quality:  Gradual Duration:  12 hours Timing:  Constant Progression:  Worsening Chronicity:  New Context: diet changes and suspicious food intake   Context: not alcohol use, not eating, not laxative use, not medication withdrawal, not previous surgeries, not recent travel, not retching, not sick contacts and not trauma   Relieved by:  Nothing Worsened by:  Nothing tried Ineffective treatments:  None tried Associated symptoms: anorexia and nausea   Associated  symptoms: no belching, no chest pain, no constipation, no cough, no diarrhea, no dysuria, no fever, no hematemesis, no hematochezia, no hematuria, no melena, no shortness of breath, no vaginal bleeding and no vomiting     Past Medical History  Diagnosis Date  . Hypertension   . Breast cancer     Right breast, status post XRT  . Hyperlipidemia   . Essential tremor   . Depression   . Diverticulosis    Past Surgical History  Procedure Laterality Date  . Breast lumpectomy Right   . Bilateral cateract surgery     Family History  Problem Relation Age of Onset  . Heart disease Mother   . Alzheimer's disease Father    History  Substance Use Topics  . Smoking status: Never Smoker   . Smokeless tobacco: Not on file  . Alcohol Use: Yes     Comment: Social: 1/2 glass red wine before dinner occasionally   OB History   Grav Para Term Preterm Abortions TAB SAB Ect Mult Living                 Review of Systems  Constitutional: Negative for fever.  Respiratory: Negative for cough and shortness of breath.   Cardiovascular: Negative for chest pain.  Gastrointestinal: Positive for nausea, abdominal pain and anorexia. Negative for vomiting, diarrhea, constipation, melena, hematochezia and hematemesis.  Genitourinary: Negative for dysuria, hematuria and vaginal bleeding.  All other systems reviewed and are negative.    Allergies  Latex and Penicillins  Home Medications   Current Outpatient Rx  Name  Route  Sig  Dispense  Refill  . aspirin 81 MG tablet   Oral   Take 1 tablet (81 mg total) by mouth daily.   30 tablet      . atorvastatin (LIPITOR) 10 MG tablet   Oral   Take 10 mg by mouth daily at 12 noon.         . calcium-vitamin D (OSCAL WITH D) 500-200 MG-UNIT per tablet   Oral   Take 2 tablets by mouth 2 (two) times daily.         . indapamide (LOZOL) 1.25 MG tablet   Oral   Take 1.25 mg by mouth every morning.         . Multiple Vitamin (MULTIVITAMIN WITH  MINERALS) TABS tablet   Oral   Take 1 tablet by mouth daily at 12 noon.         . primidone (MYSOLINE) 50 MG tablet   Oral   Take 150 mg by mouth daily at 12 noon.         . propranolol ER (INDERAL LA) 160 MG SR capsule   Oral   Take by mouth daily.         . raloxifene (EVISTA) 60 MG tablet   Oral   Take 60 mg by mouth daily at 12 noon.         . valsartan (DIOVAN) 320 MG tablet   Oral   Take 320 mg by mouth daily.          BP 140/66  Pulse 96  Temp(Src) 97.5 F (36.4 C) (Oral)  Resp 17  SpO2 96% Physical Exam  Nursing note and vitals reviewed. Constitutional: She is oriented to person, place, and time. She appears well-developed and well-nourished. No distress.  HENT:  Head: Normocephalic and atraumatic.  Right Ear: External ear normal.  Left Ear: External ear normal.  Nose: Nose normal.  Mouth/Throat: Oropharynx is clear and moist. No oropharyngeal exudate.  Eyes: Conjunctivae are normal. Pupils are equal, round, and reactive to light. No scleral icterus.  Neck: Normal range of motion. Neck supple.  Cardiovascular: Normal rate, regular rhythm and normal heart sounds.  Exam reveals no gallop and no friction rub.   No murmur heard. Pulmonary/Chest: Effort normal and breath sounds normal. No respiratory distress. She has no wheezes. She has no rales. She exhibits no tenderness.  Abdominal: Soft. Bowel sounds are normal. She exhibits no distension and no mass. There is no hepatosplenomegaly. There is tenderness. There is rebound and guarding. There is no rigidity, no CVA tenderness and no tenderness at McBurney's point.    Musculoskeletal: Normal range of motion. She exhibits no edema and no tenderness.  Lymphadenopathy:    She has no cervical adenopathy.  Neurological: She is alert and oriented to person, place, and time. She exhibits normal muscle tone. Coordination normal.  Skin: Skin is warm and dry. No rash noted. No erythema. No pallor.  Psychiatric:  She has a normal mood and affect. Her behavior is normal. Judgment and thought content normal.    ED Course  Procedures (including critical care time) Labs Review Labs Reviewed  CBC WITH DIFFERENTIAL  COMPREHENSIVE METABOLIC PANEL  LIPASE, BLOOD  URINALYSIS, ROUTINE W REFLEX MICROSCOPIC   Imaging Review No results found.  EKG Interpretation   None      9:57 PM Patient with near syncopal episode.  She has a history of vasovagal syncope with nausea and vomiting.  She states that after the administration of morphine and zofran  that she began to feel nauseaous, she informed the nurse who then took her blood pressure which was noted to be 73/40.  She never completely lost consciousness.  She reports now after 500cc fluid bolus that she feels back to normal.  Discussion with the patient reveals that her daughter will be staying with her at her home and that she feels safe to continue home.  Dr. Oletta Lamas has seen and examined this patient with me.  10:44 PM Patient with another episode of hypotension of 105/49 this not related to medication adminstration.  Patient now does not feel comfortable in going home which with a second episode of near syncope, I agree.  Will get EKG, i-stat troponin and will call [facility] for admission.  11:29 PM Spoke with Dr. Lovell Sheehan and will admit to observation. MDM  Diverticulitis Near syncope  Patient with history of vasovagal syncope in the past presents with diverticulitis, initially tried to get the patient home but after administration of pain medication she became near syncopal.  Will admit the patient to observation.   Izola Price Marisue Humble, PA-C 03/30/13 2330

## 2013-03-31 DIAGNOSIS — R55 Syncope and collapse: Secondary | ICD-10-CM

## 2013-03-31 DIAGNOSIS — K5792 Diverticulitis of intestine, part unspecified, without perforation or abscess without bleeding: Secondary | ICD-10-CM

## 2013-03-31 LAB — CBC
Hemoglobin: 13.6 g/dL (ref 12.0–15.0)
MCHC: 34.3 g/dL (ref 30.0–36.0)
RBC: 4.51 MIL/uL (ref 3.87–5.11)
RDW: 13.3 % (ref 11.5–15.5)

## 2013-03-31 LAB — BASIC METABOLIC PANEL
BUN: 8 mg/dL (ref 6–23)
GFR calc Af Amer: >90 mL/min (ref >90)
GFR calc non Af Amer: 80 mL/min — ABNORMAL LOW (ref >90)
Potassium: 3.5 mEq/L (ref 3.5–5.1)
Sodium: 134 mEq/L — ABNORMAL LOW (ref 135–145)

## 2013-03-31 MED ORDER — METRONIDAZOLE 500 MG PO TABS
500.0000 mg | ORAL_TABLET | Freq: Three times a day (TID) | ORAL | Status: DC
  Administered 2013-03-31 – 2013-04-01 (×3): 500 mg via ORAL
  Filled 2013-03-31 (×7): qty 1

## 2013-03-31 MED ORDER — METRONIDAZOLE 500 MG PO TABS: 500.0000 mg | ORAL_TABLET | Freq: Three times a day (TID) | ORAL | Status: DC

## 2013-03-31 MED ORDER — CIPROFLOXACIN HCL 500 MG PO TABS
500.0000 mg | ORAL_TABLET | Freq: Two times a day (BID) | ORAL | Status: DC
  Administered 2013-03-31 – 2013-04-01 (×2): 500 mg via ORAL
  Filled 2013-03-31 (×4): qty 1

## 2013-03-31 MED ORDER — SODIUM CHLORIDE 0.9 % IV SOLN: INTRAVENOUS | Status: DC

## 2013-03-31 MED ORDER — CIPROFLOXACIN HCL 500 MG PO TABS: 500.0000 mg | ORAL_TABLET | Freq: Two times a day (BID) | ORAL | Status: DC

## 2013-03-31 NOTE — Discharge Summary (Signed)
Physician Discharge Summary  [name]:[id number] DOB: 27-Jun-1934 DOA: 03/30/2013  PCP: Junious Silk, MD  Admit date: 03/30/2013 Discharge date: 03/31/2013  Recommendations for Outpatient Follow-up:  1. Primary care doctor ASAP for referral for renal US or MRI of the right kidney to further investigate two renal cysts vs. Masses 2. GYN ASAP for left ovarian MRI vs. Transvaginal US to characterize cystic mass   Discharge Diagnoses:  Active Problems:   Hypertension   Hyperlipidemia   Diverticulitis   Vasovagal near syncope   Discharge Condition: stable, improved  Diet recommendation: regular   Wt Readings from Last 3 Encounters:  02/15/13 65.772 kg (145 lb)  01/16/13 65.817 kg (145 lb 1.6 oz)    History of present illness:  Michelle Fuller is a 78 y.o. female with a history of Diverticulosis who presented to the ED with complaints of severe 9/10 pain that started in the lower ABD and then became focused in the LLQ. The pain began in the AM 11/07. She reports having nausea but no vomiting or diarrhea associated with the pain. She also denies havng any fevers or chills. She was evaluated in the ED and had a CT scan was performed which revealed Diverticulitis and she was placed on IV Cipro and Flagyl and referred for admission.   Hospital Course:   Acute sigmoid diverticulitis:  She was started on clear liquid diet, IV fluids, ciprofloxacin and flagyl and had fast improvement in her abdominal pain.  She denied nausea and vomiting.  She was converted to oral antibiotics and was able to eat regular foods and drink enough fluid to stay hydrated prior to discharge.  She should continue antibiotics with cipro/flagyl for a total of 10 days.  (PCN allergic)  Left ovarian cyst:  DDx includes simple cyst vs. Malignancy. F/u with gynecologist for further imaging.    Right renal cysts not clearly visualized on CT:  DDx includes simple cyst vs. Malignancy. PCP to please order  MRI vs. US of the right kidney for follow up.    HTN, blood pressure well controlled on home medications HLD, stable.  Continued atorvastatin  Leukocytosis resolved with IVF and abx.  Vasovagal syncope, resolved.    Procedures:  CT abd/pelvis  Consultations:  None  Discharge Exam: Filed Vitals:   03/31/13 0540  BP: 111/63  Pulse: 73  Temp: 98.2 F (36.8 C)  Resp: 18   Filed Vitals:   03/31/13 0144 03/31/13 0145 03/31/13 0146 03/31/13 0540  BP: 124/78 110/69 116/71 111/63  Pulse: 79 78 81 73  Temp: 98.2 F (36.8 C)   98.2 F (36.8 C)  TempSrc: Oral   Oral  Resp: 18 18 20 18   SpO2: 94% 95% 95% 94%    General: CF, NAD HEENT:  NCAT, MMM Cardiovascular: RRR, no mrg, 2+ pulses, warm extremities Respiratory: CTAB ABD:  Hyperactive BS, soft, ND, mild TTP in the LLQ without rebound or guarding MSK:  No LEE  Discharge Instructions   Future Appointments Provider Department Dept Phone   08/03/2013 2:00 PM Annamaria Boots, MD [facility] [phone]   08/14/2013 12:00 PM York Spaniel, MD [facility] [phone]       Medication List    ASK your doctor about these medications       aspirin 81 MG tablet  Take 1 tablet (81 mg total) by mouth daily.     atorvastatin 10 MG tablet  Commonly known as:  LIPITOR  Take 10 mg by  mouth daily at 12 noon.     calcium-vitamin D 500-200 MG-UNIT per tablet  Commonly known as:  OSCAL WITH D  Take 2 tablets by mouth 2 (two) times daily.     indapamide 1.25 MG tablet  Commonly known as:  LOZOL  Take 1.25 mg by mouth every morning.     multivitamin with minerals Tabs tablet  Take 1 tablet by mouth daily at 12 noon.     primidone 50 MG tablet  Commonly known as:  MYSOLINE  Take 150 mg by mouth daily at 12 noon.     propranolol ER 160 MG SR capsule  Commonly known as:  INDERAL LA  Take by mouth daily.     raloxifene 60 MG tablet  Commonly known as:  EVISTA  Take 60 mg by  mouth daily at 12 noon.     valsartan 320 MG tablet  Commonly known as:  DIOVAN  Take 320 mg by mouth daily.          The results of significant diagnostics from this hospitalization (including imaging, microbiology, ancillary and laboratory) are listed below for reference.    Significant Diagnostic Studies: Ct Abdomen Pelvis W Contrast  03/30/2013   CLINICAL DATA:  Left lower quadrant abdominal pain, nausea, history of diverticulosis  EXAM: CT ABDOMEN AND PELVIS WITH CONTRAST  TECHNIQUE: Multidetector CT imaging of the abdomen and pelvis was performed using the standard protocol following bolus administration of intravenous contrast.  CONTRAST:  50mL OMNIPAQUE IOHEXOL 300 MG/ML SOLN, OMNIPAQUE IOHEXOL 300 MG/ML SOLN  COMPARISON:  None.  FINDINGS: The visualized portions of the lung bases are clear. There are no acute musculoskeletal findings.  There is a small hiatal hernia. The liver is normal. There are numerous calcified gallstones with no inflammatory change around the gallbladder. The spleen and pancreas are normal. The adrenal glands are normal. There are a few tiny renal lesions bilaterally most of which appear of low attenuation and are consistent with cysts. However, there is an exophytic 6 mm lesion off the posterior lateral cortex of the midpole of the right kidney. This lesion is too small to characterize and furthermore appears mildly dense. There is a exophytic lower pole 15 mm lesion on the right kidney with average attenuation value of 54.  The aorta shows calcification but no dilatation. There is a nonobstructive bowel gas pattern. The appendix is not well characterized. The bladder is normal. There is a small volume of free fluid in the pelvis. Uterus and right adnexa appear normal. There is a left adnexal cystic lesion measuring 4.6 cm. There is diverticulosis involving the descending colon. At the junction of the descending colon and sigmoid colon, there is an enlarged  inflamed diverticulum arising off of the sigmoid colon. This diverticulum measures 2.5 cm. There is moderate surrounding inflammatory change and moderate focal wall thickening of the sigmoid colon. There is no free air or evidence of abscess.  IMPRESSION: 1. Acute diverticulitis involving the proximal sigmoid colon 2. 4.5 cm cystic lesion left adnexa likely of ovarian origin. Malignancy not excluded and further nonemergent evaluation of this finding recommended. Consider pelvic ultrasound or pelvic MRI. 3. Small volume free fluid in the pelvis. 4. Two indeterminate right renal lesions. One in the midpole is too small to characterize by CT. Lesion in the lower pole appears hyper attenuating. These lesions could both represent complicated renal cysts but renal mass is not excluded. Consider renal ultrasound or MRI non emergently. 5. Cholelithiasis   Electronically  Signed   By: Esperanza Heir M.D.   On: 03/30/2013 19:57    Microbiology: No results found for this or any previous visit (from the past 240 hour(s)).   Labs: Basic Metabolic Panel:  Recent Labs Lab 03/30/13 1816 03/31/13 0525  NA 134* 134*  K 3.5 3.5  CL 98 101  CO2 25 24  GLUCOSE 109* 96  BUN 12 8  CREATININE 0.92 0.75  CALCIUM 9.8 8.8   Liver Function Tests:  Recent Labs Lab 03/30/13 1816  AST 21  ALT 17  ALKPHOS 71  BILITOT 0.5  PROT 7.7  ALBUMIN 3.8    Recent Labs Lab 03/30/13 1816  LIPASE 46   No results found for this basename: AMMONIA,  in the last 168 hours CBC:  Recent Labs Lab 03/30/13 1816 03/31/13 0525  WBC 12.4* 9.8  NEUTROABS 9.3*  --   HGB 15.5* 13.6  HCT 44.1 39.6  MCV 87.3 87.8  PLT 221 175   Cardiac Enzymes: No results found for this basename: CKTOTAL, CKMB, CKMBINDEX, TROPONINI,  in the last 168 hours BNP: BNP (last 3 results) No results found for this basename: PROBNP,  in the last 8760 hours CBG: No results found for this basename: GLUCAP,  in the last 168 hours  Time  coordinating discharge: 45 minutes  Signed:  Jamol Ginyard  [facility] 03/31/2013, 1:38 PM

## 2013-03-31 NOTE — H&P (Signed)
[facility] History and Physical  [name]:[id number] DOB: 1934-06-28 DOA: 03/30/2013  Referring physician: EDP PCP: Junious Silk, MD  Specialists:   Chief Complaint:  LLQ ABD Pain  HPI: Michelle Fuller is a 78 y.o. female with a history of Diverticulosis who presented to the ED with complaints of severe 9/10 pain that started in the lower ABD and then became focused in the LLQ.  The pain began in the AM 11/07.  She reports having nausea but no vomiting or diarrhea associated with the pain.  She also denies havng any fevers or chills.  She was evaluated in the ED and had a CT scan was performed which revealed Diverticulitis and she was placed on IV Cipro and Flagyl and referred for admission.     Review of Systems: The patient denies anorexia, fever, chills, headaches, weight loss, vision loss, diplopia, dizziness, decreased hearing, rhinitis, hoarseness, chest pain, syncope, dyspnea on exertion, peripheral edema, balance deficits, cough, hemoptysis, abdominal pain, vomiting, diarrhea, constipation, hematemesis, melena, hematochezia, severe indigestion/heartburn, dysuria, hematuria, incontinence, muscle weakness, suspicious skin lesions, transient blindness, difficulty walking, depression, unusual weight change, abnormal bleeding, enlarged lymph nodes, angioedema, and breast masses.    Past Medical History  Diagnosis Date  . Hypertension   . Breast cancer     Right breast, status post XRT  . Hyperlipidemia   . Essential tremor   . Depression   . Diverticulosis     Past Surgical History  Procedure Laterality Date  . Breast lumpectomy Right   . Bilateral cateract surgery      Prior to Admission medications   Medication Sig Start Date End Date Taking? Authorizing Provider  aspirin 81 MG tablet Take 1 tablet (81 mg total) by mouth daily. 01/16/13  Yes Marinda Elk, MD  atorvastatin (LIPITOR) 10 MG tablet Take 10 mg by mouth daily at 12 noon.   Yes  Historical Provider, MD  calcium-vitamin D (OSCAL WITH D) 500-200 MG-UNIT per tablet Take 2 tablets by mouth 2 (two) times daily.   Yes Historical Provider, MD  indapamide (LOZOL) 1.25 MG tablet Take 1.25 mg by mouth every morning.   Yes Historical Provider, MD  Multiple Vitamin (MULTIVITAMIN WITH MINERALS) TABS tablet Take 1 tablet by mouth daily at 12 noon.   Yes Historical Provider, MD  primidone (MYSOLINE) 50 MG tablet Take 150 mg by mouth daily at 12 noon.   Yes Historical Provider, MD  propranolol ER (INDERAL LA) 160 MG SR capsule Take by mouth daily.   Yes Historical Provider, MD  raloxifene (EVISTA) 60 MG tablet Take 60 mg by mouth daily at 12 noon.   Yes Historical Provider, MD  valsartan (DIOVAN) 320 MG tablet Take 320 mg by mouth daily.   Yes Historical Provider, MD    Allergies  Allergen Reactions  . Latex Rash  . Penicillins     REACTION: "broke out"    Social History:  reports that she has never smoked. She does not have any smokeless tobacco history on file. She reports that she drinks alcohol. She reports that she does not use illicit drugs.     Family History  Problem Relation Age of Onset  . Heart disease Mother   . Alzheimer's disease Father      Physical Exam:  GEN:  Pleasant  Elderly Obese  78 y.o. Caucasian female  examined  and in discomfort but no acute distress; cooperative with exam Filed Vitals:   03/30/13 2331 03/31/13 0144 03/31/13 0145 03/31/13  0146  BP: 98/29 124/78 110/69 116/71  Pulse:  79 78 81  Temp:  98.2 F (36.8 C)    TempSrc:  Oral    Resp: 17 18 18 20   SpO2:  94% 95% 95%   Blood pressure 116/71, pulse 81, temperature 98.2 F (36.8 C), temperature source Oral, resp. rate 20, SpO2 95.00%. PSYCH: She is alert and oriented x4; does not appear anxious does not appear depressed; affect is normal HEENT: Normocephalic and Atraumatic, Mucous membranes pink; PERRLA; EOM intact; Fundi:  Benign;  No scleral icterus, Nares: Patent, Oropharynx:  Clear, Fair Dentition, Neck:  FROM, no cervical lymphadenopathy nor thyromegaly or carotid bruit; no JVD; Breasts:: Not examined CHEST WALL: No tenderness CHEST: Normal respiration, clear to auscultation bilaterally HEART: Regular rate and rhythm; no murmurs rubs or gallops BACK: No kyphosis or scoliosis; no CVA tenderness ABDOMEN: Positive Bowel Sounds,  Obese, soft non-tender; no masses, no organomegaly, no pannus; no intertriginous candida. Rectal Exam: Not done EXTREMITIES: No cyanosis, clubbing or edema; no ulcerations. Genitalia: not examined PULSES: 2+ and symmetric SKIN: Normal hydration no rash or ulceration CNS: Cranial nerves 2-12 grossly intact no focal neurologic deficit    Labs on Admission:  Basic Metabolic Panel:  Recent Labs Lab 03/30/13 1816  NA 134*  K 3.5  CL 98  CO2 25  GLUCOSE 109*  BUN 12  CREATININE 0.92  CALCIUM 9.8   Liver Function Tests:  Recent Labs Lab 03/30/13 1816  AST 21  ALT 17  ALKPHOS 71  BILITOT 0.5  PROT 7.7  ALBUMIN 3.8    Recent Labs Lab 03/30/13 1816  LIPASE 46   No results found for this basename: AMMONIA,  in the last 168 hours CBC:  Recent Labs Lab 03/30/13 1816  WBC 12.4*  NEUTROABS 9.3*  HGB 15.5*  HCT 44.1  MCV 87.3  PLT 221   Cardiac Enzymes: No results found for this basename: CKTOTAL, CKMB, CKMBINDEX, TROPONINI,  in the last 168 hours  BNP (last 3 results) No results found for this basename: PROBNP,  in the last 8760 hours CBG: No results found for this basename: GLUCAP,  in the last 168 hours  Radiological Exams on Admission: Ct Abdomen Pelvis W Contrast  03/30/2013   CLINICAL DATA:  Left lower quadrant abdominal pain, nausea, history of diverticulosis  EXAM: CT ABDOMEN AND PELVIS WITH CONTRAST  TECHNIQUE: Multidetector CT imaging of the abdomen and pelvis was performed using the standard protocol following bolus administration of intravenous contrast.  CONTRAST:  50mL OMNIPAQUE IOHEXOL 300 MG/ML  SOLN, OMNIPAQUE IOHEXOL 300 MG/ML SOLN  COMPARISON:  None.  FINDINGS: The visualized portions of the lung bases are clear. There are no acute musculoskeletal findings.  There is a small hiatal hernia. The liver is normal. There are numerous calcified gallstones with no inflammatory change around the gallbladder. The spleen and pancreas are normal. The adrenal glands are normal. There are a few tiny renal lesions bilaterally most of which appear of low attenuation and are consistent with cysts. However, there is an exophytic 6 mm lesion off the posterior lateral cortex of the midpole of the right kidney. This lesion is too small to characterize and furthermore appears mildly dense. There is a exophytic lower pole 15 mm lesion on the right kidney with average attenuation value of 54.  The aorta shows calcification but no dilatation. There is a nonobstructive bowel gas pattern. The appendix is not well characterized. The bladder is normal. There is a small volume  of free fluid in the pelvis. Uterus and right adnexa appear normal. There is a left adnexal cystic lesion measuring 4.6 cm. There is diverticulosis involving the descending colon. At the junction of the descending colon and sigmoid colon, there is an enlarged inflamed diverticulum arising off of the sigmoid colon. This diverticulum measures 2.5 cm. There is moderate surrounding inflammatory change and moderate focal wall thickening of the sigmoid colon. There is no free air or evidence of abscess.  IMPRESSION: 1. Acute diverticulitis involving the proximal sigmoid colon 2. 4.5 cm cystic lesion left adnexa likely of ovarian origin. Malignancy not excluded and further nonemergent evaluation of this finding recommended. Consider pelvic ultrasound or pelvic MRI. 3. Small volume free fluid in the pelvis. 4. Two indeterminate right renal lesions. One in the midpole is too small to characterize by CT. Lesion in the lower pole appears hyper attenuating. These  lesions could both represent complicated renal cysts but renal mass is not excluded. Consider renal ultrasound or MRI non emergently. 5. Cholelithiasis   Electronically Signed   By: Esperanza Heir M.D.   On: 03/30/2013 19:57       Assessment/Plan Active Problems:   Hypertension   Hyperlipidemia   Diverticulitis   Vasovagal near syncope    1.   Diverticulitis-  Placed on IV cipro and Flagyl Rx, and IVFs, and Pain control PRN.  Diet Clears and to advance as condition improves.    2.   Hypertension-   Monitor BPs, continue Lozol, and Diovan Rx.    3.   Hyperlipidemia- Continue Atorvastatin.    4.  Leukocytosis-  Monitor Trend, on Antibiotics for Diverticulitis.    5.   Vasovagal episode- Monitor.  6.  DVT prophylaxis with SCDs.           Code Status:   FULL CODE    Family Communication:    No Family Present  Disposition Plan:     Observation     Time spent:  64 Minutes  Ron Parker [facility] Pager [phone]  If 7PM-7AM, please contact night-coverage [email or web address] Password [email or web address] 03/31/2013, 2:35 AM

## 2013-03-31 NOTE — Progress Notes (Signed)
Patient continues to have bloating and pain.  Will reassess in am.

## 2013-03-31 NOTE — Progress Notes (Signed)
[facility] PROGRESS NOTE  Michelle Fuller:[id number] DOB: 04/03/1935 DOA: 03/30/2013 PCP: Junious Silk, MD  Assessment/Plan  Acute sigmoid diverticulitis: She was started on clear liquid diet, IV fluids, ciprofloxacin and flagyl and had fast improvement in her abdominal pain. She denied nausea and vomiting.  -  Converted to oral antibiotics and advance diet -  If able to stay hydrated and pain still improved, may d/c to home tonight or tomorrow  Left ovarian cyst: DDx includes simple cyst vs. Malignancy. F/u with gynecologist for further imaging.  Right renal cysts not clearly visualized on CT: DDx includes simple cyst vs. Malignancy. PCP to please order MRI vs. US of the right kidney for follow up.  HTN, blood pressure well controlled on home medications  HLD, stable. Continued atorvastatin  Leukocytosis resolved with IVF and abx.  Vasovagal syncope, resolved.   Diet:  FLD, then healthy heart at dinner Access:  PIV IVF:  OFF Proph:  lovenox  Code Status: full Family Communication: patient alone Disposition Plan: pending tolerating diet, abdominal pain still resolving   Consultants:  none  Procedures:  CT abd/pelvis  Antibiotics:  cipro 11/8 >>  Flagyl 11/8 >>   HPI/Subjective:  Abdominal pain now 3/10 and tolerated some liquids for breakfast    Objective: Filed Vitals:   03/31/13 0144 03/31/13 0145 03/31/13 0146 03/31/13 0540  BP: 124/78 110/69 116/71 111/63  Pulse: 79 78 81 73  Temp: 98.2 F (36.8 C)   98.2 F (36.8 C)  TempSrc: Oral   Oral  Resp: 18 18 20 18   SpO2: 94% 95% 95% 94%   No intake or output data in the 24 hours ending 03/31/13 1400 There were no vitals filed for this visit.  Exam:  General: CF, NAD  HEENT: NCAT, MMM  Cardiovascular: RRR, no mrg, 2+ pulses, warm extremities  Respiratory: CTAB  ABD: Hyperactive BS, soft, ND/NT  MSK: No LEE   Data Reviewed: Basic Metabolic Panel:  Recent Labs Lab 03/30/13 1816  03/31/13 0525  NA 134* 134*  K 3.5 3.5  CL 98 101  CO2 25 24  GLUCOSE 109* 96  BUN 12 8  CREATININE 0.92 0.75  CALCIUM 9.8 8.8   Liver Function Tests:  Recent Labs Lab 03/30/13 1816  AST 21  ALT 17  ALKPHOS 71  BILITOT 0.5  PROT 7.7  ALBUMIN 3.8    Recent Labs Lab 03/30/13 1816  LIPASE 46   No results found for this basename: AMMONIA,  in the last 168 hours CBC:  Recent Labs Lab 03/30/13 1816 03/31/13 0525  WBC 12.4* 9.8  NEUTROABS 9.3*  --   HGB 15.5* 13.6  HCT 44.1 39.6  MCV 87.3 87.8  PLT 221 175   Cardiac Enzymes: No results found for this basename: CKTOTAL, CKMB, CKMBINDEX, TROPONINI,  in the last 168 hours BNP (last 3 results) No results found for this basename: PROBNP,  in the last 8760 hours CBG: No results found for this basename: GLUCAP,  in the last 168 hours  No results found for this or any previous visit (from the past 240 hour(s)).   Studies: Ct Abdomen Pelvis W Contrast  03/30/2013   CLINICAL DATA:  Left lower quadrant abdominal pain, nausea, history of diverticulosis  EXAM: CT ABDOMEN AND PELVIS WITH CONTRAST  TECHNIQUE: Multidetector CT imaging of the abdomen and pelvis was performed using the standard protocol following bolus administration of intravenous contrast.  CONTRAST:  50mL OMNIPAQUE IOHEXOL 300 MG/ML SOLN, OMNIPAQUE IOHEXOL 300 MG/ML  SOLN  COMPARISON:  None.  FINDINGS: The visualized portions of the lung bases are clear. There are no acute musculoskeletal findings.  There is a small hiatal hernia. The liver is normal. There are numerous calcified gallstones with no inflammatory change around the gallbladder. The spleen and pancreas are normal. The adrenal glands are normal. There are a few tiny renal lesions bilaterally most of which appear of low attenuation and are consistent with cysts. However, there is an exophytic 6 mm lesion off the posterior lateral cortex of the midpole of the right kidney. This lesion is too small to  characterize and furthermore appears mildly dense. There is a exophytic lower pole 15 mm lesion on the right kidney with average attenuation value of 54.  The aorta shows calcification but no dilatation. There is a nonobstructive bowel gas pattern. The appendix is not well characterized. The bladder is normal. There is a small volume of free fluid in the pelvis. Uterus and right adnexa appear normal. There is a left adnexal cystic lesion measuring 4.6 cm. There is diverticulosis involving the descending colon. At the junction of the descending colon and sigmoid colon, there is an enlarged inflamed diverticulum arising off of the sigmoid colon. This diverticulum measures 2.5 cm. There is moderate surrounding inflammatory change and moderate focal wall thickening of the sigmoid colon. There is no free air or evidence of abscess.  IMPRESSION: 1. Acute diverticulitis involving the proximal sigmoid colon 2. 4.5 cm cystic lesion left adnexa likely of ovarian origin. Malignancy not excluded and further nonemergent evaluation of this finding recommended. Consider pelvic ultrasound or pelvic MRI. 3. Small volume free fluid in the pelvis. 4. Two indeterminate right renal lesions. One in the midpole is too small to characterize by CT. Lesion in the lower pole appears hyper attenuating. These lesions could both represent complicated renal cysts but renal mass is not excluded. Consider renal ultrasound or MRI non emergently. 5. Cholelithiasis   Electronically Signed   By: Esperanza Heir M.D.   On: 03/30/2013 19:57    Scheduled Meds: . ciprofloxacin  500 mg Oral BID  . metroNIDAZOLE  500 mg Oral Q8H   Continuous Infusions:   Active Problems:   Hypertension   Hyperlipidemia   Diverticulitis   Vasovagal near syncope    Time spent: 30 min    Leanore Biggers, [facility]  [facility] Pager [phone]. If 7PM-7AM, please contact night-coverage at [email or web address], password [facility] 03/31/2013, 2:00 PM  LOS: 1 day

## 2013-04-01 NOTE — Progress Notes (Signed)
Patient discharged home in stable condition.  No change from morning assessment.  Instructions and scripts provided with verbal understanding.  All questions and concerns answered.

## 2013-04-03 ENCOUNTER — Telehealth: Payer: Self-pay | Admitting: Obstetrics & Gynecology

## 2013-04-03 DIAGNOSIS — R1900 Intra-abdominal and pelvic swelling, mass and lump, unspecified site: Secondary | ICD-10-CM

## 2013-04-03 NOTE — Telephone Encounter (Signed)
Patient was hospitalized over the weekend and was told she has ovarian and kidney cyst. Patient thinks she needs an MRI.

## 2013-04-03 NOTE — Telephone Encounter (Signed)
Last AEX 04-2012 with Dr Hyacinth Meeker, see pre_EPIC chart. ER notes and CT scan reviewed by Dr Hyacinth Meeker and recommends PUS and OV.   Patient notified and scheduled for 04-05-13 at 1pm.  Routing to provider for final review. Patient agreeable to disposition. Will close encounter

## 2013-04-05 ENCOUNTER — Encounter (HOSPITAL_COMMUNITY): Payer: Self-pay | Admitting: Emergency Medicine

## 2013-04-05 ENCOUNTER — Encounter: Payer: Self-pay | Admitting: Obstetrics & Gynecology

## 2013-04-05 ENCOUNTER — Other Ambulatory Visit: Payer: Self-pay

## 2013-04-05 ENCOUNTER — Emergency Department (HOSPITAL_COMMUNITY)
Admission: EM | Admit: 2013-04-05 | Discharge: 2013-04-05 | Disposition: A | Discharge status: Home / Self Care | Payer: Medicare Other | Attending: Emergency Medicine | Admitting: Emergency Medicine

## 2013-04-05 ENCOUNTER — Emergency Department (HOSPITAL_COMMUNITY): Payer: Medicare Other

## 2013-04-05 ENCOUNTER — Other Ambulatory Visit: Payer: Self-pay | Admitting: Obstetrics & Gynecology

## 2013-04-05 DIAGNOSIS — F329 Major depressive disorder, single episode, unspecified: Secondary | ICD-10-CM | POA: Insufficient documentation

## 2013-04-05 DIAGNOSIS — E785 Hyperlipidemia, unspecified: Secondary | ICD-10-CM | POA: Insufficient documentation

## 2013-04-05 DIAGNOSIS — Z79899 Other long term (current) drug therapy: Secondary | ICD-10-CM | POA: Insufficient documentation

## 2013-04-05 DIAGNOSIS — F3289 Other specified depressive episodes: Secondary | ICD-10-CM | POA: Insufficient documentation

## 2013-04-05 DIAGNOSIS — Z7982 Long term (current) use of aspirin: Secondary | ICD-10-CM | POA: Insufficient documentation

## 2013-04-05 DIAGNOSIS — K5732 Diverticulitis of large intestine without perforation or abscess without bleeding: Secondary | ICD-10-CM | POA: Insufficient documentation

## 2013-04-05 DIAGNOSIS — K5792 Diverticulitis of intestine, part unspecified, without perforation or abscess without bleeding: Secondary | ICD-10-CM

## 2013-04-05 DIAGNOSIS — Z8739 Personal history of other diseases of the musculoskeletal system and connective tissue: Secondary | ICD-10-CM | POA: Insufficient documentation

## 2013-04-05 DIAGNOSIS — I10 Essential (primary) hypertension: Secondary | ICD-10-CM | POA: Insufficient documentation

## 2013-04-05 DIAGNOSIS — Z853 Personal history of malignant neoplasm of breast: Secondary | ICD-10-CM | POA: Insufficient documentation

## 2013-04-05 DIAGNOSIS — E7800 Pure hypercholesterolemia, unspecified: Secondary | ICD-10-CM | POA: Insufficient documentation

## 2013-04-05 DIAGNOSIS — Z8669 Personal history of other diseases of the nervous system and sense organs: Secondary | ICD-10-CM | POA: Insufficient documentation

## 2013-04-05 DIAGNOSIS — R110 Nausea: Secondary | ICD-10-CM | POA: Insufficient documentation

## 2013-04-05 DIAGNOSIS — R5381 Other malaise: Secondary | ICD-10-CM | POA: Insufficient documentation

## 2013-04-05 DIAGNOSIS — Z91040 Latex allergy status: Secondary | ICD-10-CM | POA: Insufficient documentation

## 2013-04-05 DIAGNOSIS — G4733 Obstructive sleep apnea (adult) (pediatric): Secondary | ICD-10-CM | POA: Insufficient documentation

## 2013-04-05 DIAGNOSIS — Z880 Allergy status to penicillin: Secondary | ICD-10-CM | POA: Insufficient documentation

## 2013-04-05 DIAGNOSIS — E871 Hypo-osmolality and hyponatremia: Secondary | ICD-10-CM | POA: Insufficient documentation

## 2013-04-05 LAB — URINALYSIS, ROUTINE W REFLEX MICROSCOPIC
Bilirubin Urine: NEGATIVE
Leukocytes, UA: NEGATIVE
Nitrite: NEGATIVE
Specific Gravity, Urine: 1.014 (ref 1.005–1.030)
pH: 7 (ref 5.0–8.0)

## 2013-04-05 LAB — CBC WITH DIFFERENTIAL/PLATELET
Basophils Absolute: 0.1 10*3/uL (ref 0.0–0.1)
Basophils Relative: 1 % (ref 0–1)
Eosinophils Absolute: 0.2 10*3/uL (ref 0.0–0.7)
MCH: 30.2 pg (ref 26.0–34.0)
MCHC: 35.4 g/dL (ref 30.0–36.0)
Neutrophils Relative %: 60 % (ref 43–77)
Platelets: 313 10*3/uL (ref 150–400)
RDW: 13 % (ref 11.5–15.5)

## 2013-04-05 LAB — COMPREHENSIVE METABOLIC PANEL
ALT: 35 U/L (ref 0–35)
Albumin: 3.8 g/dL (ref 3.5–5.2)
Alkaline Phosphatase: 58 U/L (ref 39–117)
BUN: 11 mg/dL (ref 6–23)
Calcium: 9.4 mg/dL (ref 8.4–10.5)
Chloride: 98 mEq/L (ref 96–112)
GFR calc non Af Amer: 80 mL/min — ABNORMAL LOW (ref >90)
Potassium: 3.5 mEq/L (ref 3.5–5.1)
Sodium: 131 mEq/L — ABNORMAL LOW (ref 135–145)
Total Protein: 7.5 g/dL (ref 6.0–8.3)

## 2013-04-05 LAB — LIPASE, BLOOD: Lipase: 54 U/L (ref 11–59)

## 2013-04-05 MED ORDER — SODIUM CHLORIDE 0.9 % IV BOLUS (SEPSIS)
500.0000 mL | Freq: Once | INTRAVENOUS | Status: AC
  Administered 2013-04-05: 500 mL via INTRAVENOUS

## 2013-04-05 MED ORDER — ONDANSETRON HCL 4 MG/2ML IJ SOLN
4.0000 mg | Freq: Once | INTRAMUSCULAR | Status: AC
  Administered 2013-04-05: 4 mg via INTRAVENOUS
  Filled 2013-04-05: qty 2

## 2013-04-05 MED ORDER — ONDANSETRON 8 MG PO TBDP: 8.0000 mg | ORAL_TABLET | Freq: Three times a day (TID) | ORAL | Status: DC | PRN

## 2013-04-05 NOTE — Telephone Encounter (Signed)
Patient currently in ER. Routing to Dr. Hyacinth Meeker for FYI.  Will have to call next week to reschedule PUS.

## 2013-04-05 NOTE — Telephone Encounter (Signed)
Patient called and cancelled her ultrasound for today due to having to go back to the hospital do to complications with treatment for diverticulitis. Did not treat this as a missed appointment. Please call patient back to reschedule at a later date to reschedule.

## 2013-04-05 NOTE — ED Notes (Addendum)
Pt was admitted to the hospital for diverticulitis over the weekend and states ever since Saturday when she was discharged she has been feeling nauseous and has not gotten any better, She is taken Flagyl currently and Cipro. States this morning she had a little loose stool but not diarrhea and sneezed while on the toilet and has a little pain on the lower right side of abd. Denies vomiting.

## 2013-04-05 NOTE — ED Provider Notes (Signed)
CSN: [id number]     Arrival date & time 04/05/13  1011 History   First MD Initiated Contact with Patient 04/05/13 1017     Chief Complaint  Patient presents with  . Nausea  . Abdominal Pain   (Consider location/radiation/quality/duration/timing/severity/associated sxs/prior Treatment) Patient is a 78 y.o. female presenting with abdominal pain. The history is provided by the patient.  Abdominal Pain Associated symptoms: fatigue and nausea   Associated symptoms: no chest pain, no diarrhea, no shortness of breath and no vomiting    patient was recently in the hospital for diverticulitis. She was discharged on Cipro and Flagyl 4 days ago. She states that she's had continued nausea. She has not vomited but has been eating less. She's had some loose stools that became watery today. She states she coughed while in the bathroom and developed right lower abdominal pain. No fevers. She states she feels fatigued. No blood in the stool.  Past Medical History  Diagnosis Date  . Hypertension   . Breast cancer     Right breast, status post XRT  . Hyperlipidemia   . Essential tremor   . Depression   . Diverticulosis   . Post-menopausal bleeding 01/2000    Huge polyps exc  . Sleep apnea, obstructive     mild  . Elevated LDL cholesterol level   . Osteopenia 8/97    Spine  . Syncope    Past Surgical History  Procedure Laterality Date  . Breast lumpectomy Right   . Bilateral cateract surgery     Family History  Problem Relation Age of Onset  . Heart disease Mother   . Alzheimer's disease Father    History  Substance Use Topics  . Smoking status: Never Smoker   . Smokeless tobacco: Not on file  . Alcohol Use: Yes     Comment: Social: 1/2 glass red wine before dinner occasionally   OB History   Grav Para Term Preterm Abortions TAB SAB Ect Mult Living   3 3        3      Review of Systems  Constitutional: Positive for appetite change and fatigue. Negative for activity change.  Eyes:  Negative for pain.  Respiratory: Negative for chest tightness and shortness of breath.   Cardiovascular: Negative for chest pain and leg swelling.  Gastrointestinal: Positive for nausea and abdominal pain. Negative for vomiting and diarrhea.  Genitourinary: Negative for flank pain.  Musculoskeletal: Negative for back pain and neck stiffness.  Skin: Negative for rash.  Neurological: Negative for weakness, numbness and headaches.  Psychiatric/Behavioral: Negative for behavioral problems.    Allergies  Latex and Penicillins  Home Medications   Current Outpatient Rx  Name  Route  Sig  Dispense  Refill  . aspirin 81 MG tablet   Oral   Take 1 tablet (81 mg total) by mouth daily.   30 tablet      . atorvastatin (LIPITOR) 10 MG tablet   Oral   Take 10 mg by mouth daily at 12 noon.         . ciprofloxacin (CIPRO) 500 MG tablet   Oral   Take 1 tablet (500 mg total) by mouth 2 (two) times daily.   20 tablet   0   . clonazePAM (KLONOPIN) 0.5 MG tablet   Oral   Take 1 tablet by mouth 2 (two) times daily.         Marland Kitchen escitalopram (LEXAPRO) 10 MG tablet   Oral   Take 1  tablet by mouth daily.         . indapamide (LOZOL) 1.25 MG tablet   Oral   Take 1.25 mg by mouth every morning.         . metroNIDAZOLE (FLAGYL) 500 MG tablet   Oral   Take 1 tablet (500 mg total) by mouth every 8 (eight) hours.   30 tablet   0   . primidone (MYSOLINE) 50 MG tablet   Oral   Take 150 mg by mouth daily at 12 noon.         . propranolol ER (INDERAL LA) 160 MG SR capsule   Oral   Take by mouth daily.         . raloxifene (EVISTA) 60 MG tablet   Oral   Take 60 mg by mouth daily at 12 noon.         . valsartan (DIOVAN) 320 MG tablet   Oral   Take 320 mg by mouth daily.         . ondansetron (ZOFRAN-ODT) 8 MG disintegrating tablet   Oral   Take 1 tablet (8 mg total) by mouth every 8 (eight) hours as needed for nausea or vomiting.   20 tablet   0    BP 134/76  Pulse  72  Temp(Src) 98.7 F (37.1 C) (Oral)  Resp 20  SpO2 98% Physical Exam  Nursing note and vitals reviewed. Constitutional: She is oriented to person, place, and time. She appears well-developed and well-nourished.  HENT:  Head: Normocephalic and atraumatic.  Neck: Neck supple.  Cardiovascular: Normal rate, regular rhythm and normal heart sounds.   No murmur heard. Pulmonary/Chest: Effort normal and breath sounds normal. No respiratory distress. She has no wheezes. She has no rales.  Abdominal: Soft. Bowel sounds are normal. She exhibits no distension. There is tenderness. There is no rebound and no guarding.  Left lower quadrant tenderness. No hernias palpated. No rebound or guarding.  Musculoskeletal: Normal range of motion.  Neurological: She is alert and oriented to person, place, and time. No cranial nerve deficit.  Skin: Skin is warm and dry.  Psychiatric: She has a normal mood and affect. Her speech is normal.    ED Course  Procedures (including critical care time) Labs Review Labs Reviewed  CBC WITH DIFFERENTIAL - Abnormal; Notable for the following:    RBC 5.29 (*)    Hemoglobin 16.0 (*)    Monocytes Relative 14 (*)    All other components within normal limits  COMPREHENSIVE METABOLIC PANEL - Abnormal; Notable for the following:    Sodium 131 (*)    Glucose, Bld 106 (*)    AST 46 (*)    GFR calc non Af Amer 80 (*)    All other components within normal limits  LIPASE, BLOOD  URINALYSIS, ROUTINE W REFLEX MICROSCOPIC   Imaging Review Dg Abd 2 Views  04/05/2013   CLINICAL DATA:  Abdominal pain.  EXAM: ABDOMEN - 2 VIEW  COMPARISON:  03/30/2013  FINDINGS: Calcified gallstones are noted within the gallbladder. Oral contrast material noted throughout the colon filling numerous diffuse diverticula. Nonobstructive bowel gas pattern. No free air. No organomegaly. With the colonic oral contrast material it is difficult to exclude small stones, but no suspicious calcification  definitively seen. Visualized lung bases are clear.  IMPRESSION: No evidence of bowel obstruction or free air.  Cholelithiasis.  Diffuse colonic diverticulosis.   Electronically Signed   By: Charlett Nose M.D.   On: 04/05/2013 11:53  EKG Interpretation   None       MDM   1. Nausea   2. Diverticulitis   3. Hyponatremia    Patient with nausea and some abdominal pain. Has known diverticulitis is being treated. Lab work is reassuring. X-ray does not show obstruction. Rather benign exam. Patient feels better after IV fluids and Zofran. She's tolerated orals will be discharged home. She's not appear to need another CT at this time    Juliet Rude. Rubin Payor, MD 04/05/13 1521

## 2013-04-10 NOTE — Telephone Encounter (Signed)
Dr. Hyacinth Meeker, I spoke with patient. She has been on treatment for diverticulitis and states that today is her last day on treatment. She complains of continued bloating and "slight pain."  She does not have any follow up appointments scheduled other than knowing she needs PUS. She wanted to know if you want her to wait for bloating to subside prior to U/S or if we should go ahead and schedule.

## 2013-04-10 NOTE — Telephone Encounter (Signed)
Wait.  I would wait another month, honestly.  Can you call her in a month and schedule the u/s?  Thanks.

## 2013-04-10 NOTE — Telephone Encounter (Signed)
Patient needs to reschedule her PUS appointment. Patient is asking to talk to a nurse before rescheduling this appointment.

## 2013-04-11 NOTE — Telephone Encounter (Signed)
Spoke with patient. Message from Dr. Hyacinth Meeker given and patient is agreeable. She will see pcp to discuss any continual bloating or pain. Will schedule  PUS in one month.

## 2013-04-12 ENCOUNTER — Other Ambulatory Visit: Payer: Medicare Other | Admitting: Obstetrics & Gynecology

## 2013-04-12 ENCOUNTER — Other Ambulatory Visit: Payer: Medicare Other

## 2013-05-03 NOTE — Telephone Encounter (Signed)
Routing to Sabrina

## 2013-05-03 NOTE — Telephone Encounter (Signed)
Spoke with patient and scheduled for 05/31/13.

## 2013-05-24 DIAGNOSIS — R079 Chest pain, unspecified: Secondary | ICD-10-CM

## 2013-05-24 HISTORY — DX: Chest pain, unspecified: R07.9

## 2013-05-31 ENCOUNTER — Ambulatory Visit (INDEPENDENT_AMBULATORY_CARE_PROVIDER_SITE_OTHER): Payer: Medicare Other | Admitting: Obstetrics & Gynecology

## 2013-05-31 ENCOUNTER — Ambulatory Visit (INDEPENDENT_AMBULATORY_CARE_PROVIDER_SITE_OTHER): Payer: Medicare Other

## 2013-05-31 VITALS — BP 124/84 | Ht 63.25 in | Wt 139.8 lb

## 2013-05-31 DIAGNOSIS — R1900 Intra-abdominal and pelvic swelling, mass and lump, unspecified site: Secondary | ICD-10-CM

## 2013-05-31 DIAGNOSIS — N83209 Unspecified ovarian cyst, unspecified side: Secondary | ICD-10-CM

## 2013-05-31 NOTE — Progress Notes (Signed)
79 y.o.Widowedfemale here for a pelvic ultrasound after having an ovarian cyst seen on CT which was done for diverticulosis.  CT was obtained 03/30/13 and showed a small hiatal hernia, small renal cysts, left adnexal cyst 4.6cm, diverticulosis of the left descending colon with inflamed diverticulum of the sigmoid colon.  All of this was reviewed with patient today.  She has been treated with antibiotics and is feeling much better.  She is here for ultrasound to better evaluate the ovarian cyst.   No LMP recorded. Patient is postmenopausal.  FINDINGS: UTERUS: 7.0 x 5.4 x 3.5cm EMS: 4.11mm and symmetric ADNEXA:   Left ovary 3.7 x 3.1 x 4.0cm with 4.5 x 3.7cm cyst that is avascular but contains internal echoes and thickened superior aspect of cyst   Right ovary 3.1 x 2.0 x 1.4cm.  Atrohpic CUL DE SAC:  No free fluid  Images reviewed with patient.  Feel this is benign.  As she has just finished diverticulitis treatment, feel should check ca-125 and repeat U/S.  If enlarging, will of course remove.  If stable, pt and I will discuss her comfort level in continuing to monitor.  All questions answered.    Assessment:  Ovarian cyst, recent diverticulsos Plan: Ca-125, repeat PUS 3-4 months.  Has AEX scheduled 3/15.  ~15 minutes spent with patient >50% of time was in face to face discussion of above.

## 2013-06-01 LAB — CA 125: CA 125: 10.8 U/mL (ref 0.0–30.2)

## 2013-06-05 ENCOUNTER — Telehealth: Payer: Self-pay

## 2013-06-05 NOTE — Telephone Encounter (Signed)
Returning a call to Michelle Fuller °

## 2013-06-05 NOTE — Telephone Encounter (Signed)
Message copied by Robley Fries on Tue Jun 05, 2013  3:06 PM ------      Message from: Megan Salon      Created: Tue Jun 05, 2013  2:38 PM       Inform normal ------

## 2013-06-05 NOTE — Telephone Encounter (Signed)
[email or web address]

## 2013-06-07 NOTE — Telephone Encounter (Signed)
Patient notified of lab results

## 2013-06-07 NOTE — Telephone Encounter (Signed)
Patient returning [facility] call. Patient requests callback between 4-5 PM today please.

## 2013-06-07 NOTE — Telephone Encounter (Signed)
Patient aware of lab results.

## 2013-06-10 ENCOUNTER — Encounter: Payer: Self-pay | Admitting: Obstetrics & Gynecology

## 2013-06-10 NOTE — Patient Instructions (Signed)
Please call with any new problem/concern.

## 2013-06-13 ENCOUNTER — Telehealth: Payer: Self-pay | Admitting: Obstetrics & Gynecology

## 2013-06-13 NOTE — Telephone Encounter (Signed)
Telephoned patient to provide benefits information and schedule follow up PUS/ left message for patient to call back.

## 2013-06-13 NOTE — Telephone Encounter (Signed)
Telephoned patient/ advised of insurance quote of $140 for PUS/ scheduled PUS/ advised patient of cancellation policy and fee/ patient agreeable/ssf

## 2013-06-13 NOTE — Telephone Encounter (Signed)
Pt returning call

## 2013-07-23 ENCOUNTER — Telehealth: Payer: Self-pay | Admitting: Obstetrics & Gynecology

## 2013-07-23 NOTE — Telephone Encounter (Signed)
Per patient, her doctor, Dr. Elyse Hsu, says she has cysts on kidneys and will have to do same kind of procedure (PUS) as the patient is scheduled here for 09/06/13. The patient wants to know if Dr. Sabra Heck can scan her kidney cysts at the same time?

## 2013-07-23 NOTE — Telephone Encounter (Signed)
Routing to Dr Miller for review and advice.  °

## 2013-07-23 NOTE — Telephone Encounter (Signed)
She needs two separate ultrasounds.  Let Dr. Elyse Hsu schedule the renal ultrasound now and I will see her in April for the repeat gyn ultrasound.

## 2013-07-24 NOTE — Telephone Encounter (Addendum)
Spoke with pt about having renal US done separately by Dr. Elyse Hsu now and then having PUS done here next month. Pt agreeable and will call his office. Pt inquired about sending last PUS results to Dr. Elyse Hsu. Pt does not have a release signed on her chart. Pt agreeable to having one mailed to her for her to sign, then we will send records to his office. Pt appreciative.

## 2013-08-01 ENCOUNTER — Ambulatory Visit: Payer: Self-pay | Admitting: Obstetrics & Gynecology

## 2013-08-02 ENCOUNTER — Other Ambulatory Visit: Payer: Self-pay | Admitting: Endocrinology

## 2013-08-02 DIAGNOSIS — N289 Disorder of kidney and ureter, unspecified: Secondary | ICD-10-CM

## 2013-08-03 ENCOUNTER — Ambulatory Visit
Admission: RE | Admit: 2013-08-03 | Discharge: 2013-08-03 | Disposition: A | Discharge status: Home / Self Care | Payer: 59 | Source: Ambulatory Visit | Attending: Endocrinology | Admitting: Endocrinology

## 2013-08-03 ENCOUNTER — Ambulatory Visit: Payer: Self-pay | Admitting: Obstetrics & Gynecology

## 2013-08-03 DIAGNOSIS — N289 Disorder of kidney and ureter, unspecified: Secondary | ICD-10-CM

## 2013-08-14 ENCOUNTER — Ambulatory Visit: Payer: Medicare Other | Admitting: Neurology

## 2013-09-04 ENCOUNTER — Encounter: Payer: Self-pay | Admitting: Obstetrics & Gynecology

## 2013-09-04 ENCOUNTER — Ambulatory Visit (INDEPENDENT_AMBULATORY_CARE_PROVIDER_SITE_OTHER): Payer: Medicare Other | Admitting: Obstetrics & Gynecology

## 2013-09-04 VITALS — BP 132/84 | HR 58 | Resp 16 | Ht 64.0 in | Wt 142.6 lb

## 2013-09-04 DIAGNOSIS — Z01419 Encounter for gynecological examination (general) (routine) without abnormal findings: Secondary | ICD-10-CM

## 2013-09-04 DIAGNOSIS — Z0000 Encounter for general adult medical examination without abnormal findings: Secondary | ICD-10-CM

## 2013-09-04 LAB — POCT URINALYSIS DIPSTICK
BILIRUBIN UA: NEGATIVE
GLUCOSE UA: NEGATIVE
Ketones, UA: NEGATIVE
Leukocytes, UA: NEGATIVE
Nitrite, UA: NEGATIVE
PH UA: 5
Protein, UA: NEGATIVE
RBC UA: NEGATIVE
Urobilinogen, UA: NEGATIVE

## 2013-09-04 NOTE — Progress Notes (Signed)
79 y.o. G3P3 WidowedCaucasianF here for annual exam.  Having some issues with diverticulitis.  Has ovarian cyst that I am following.  Has follow-up in two days.    Has been on Evista for several years.  Feel she should stop now and repeat BMD next year.    Patient's last menstrual period was 05/25/1999.          Sexually active: no  The current method of family planning is post menopausal status.    Exercising: yes  tai chi Smoker:  no  Health Maintenance: Pap:  01/25/08-WNL History of abnormal Pap:  yes MMG:  08/13/13-normal Colonoscopy:  6/11-repeat in 5 years BMD:   08/10/11-osteopenia TDaP:  Up to date with PCP Screening Labs: PCP, Hb today: PCP, Urine today: negative   reports that she has never smoked. She has never used smokeless tobacco. She reports that she does not drink alcohol or use illicit drugs.  Past Medical History  Diagnosis Date  . Hypertension   . Breast cancer     Right breast, status post XRT  . Hyperlipidemia   . Essential tremor   . Depression   . Diverticulosis   . Post-menopausal bleeding 01/2000    Huge polyps exc  . Sleep apnea, obstructive     mild  . Elevated LDL cholesterol level   . Osteopenia 8/97    Spine  . Syncope   . Goiter     multinodular  . Colon polyp   . Ovarian cyst     found on CT scan    Past Surgical History  Procedure Laterality Date  . Breast lumpectomy Right   . Bilateral cateract surgery    . Hysteroscopy      with resection  . Colonoscopy w/ polypectomy      Current Outpatient Prescriptions  Medication Sig Dispense Refill  . aspirin 81 MG tablet Take 1 tablet (81 mg total) by mouth daily.  30 tablet    . atorvastatin (LIPITOR) 10 MG tablet Take 10 mg by mouth daily at 12 noon.      . clonazePAM (KLONOPIN) 0.5 MG tablet Take 1 tablet by mouth 2 (two) times daily.      Marland Kitchen escitalopram (LEXAPRO) 10 MG tablet Take 1 tablet by mouth daily.      . Flaxseed, Linseed, (SM FLAX SEED OIL) 1000 MG CAPS Take by mouth.       . indapamide (LOZOL) 1.25 MG tablet Take 1.25 mg by mouth every morning.      . Multiple Vitamins-Minerals (MULTIVITAMIN PO) Take by mouth. Centrum silver one  A day      . primidone (MYSOLINE) 50 MG tablet Take 150 mg by mouth daily at 12 noon.      . propranolol ER (INDERAL LA) 160 MG SR capsule Take by mouth daily.      . raloxifene (EVISTA) 60 MG tablet Take 60 mg by mouth daily at 12 noon.      . valsartan (DIOVAN) 320 MG tablet Take 320 mg by mouth daily.      . ciprofloxacin (CIPRO) 500 MG tablet Take 1 tablet (500 mg total) by mouth 2 (two) times daily.  20 tablet  0  . metroNIDAZOLE (FLAGYL) 500 MG tablet Take 1 tablet (500 mg total) by mouth every 8 (eight) hours.  30 tablet  0  . ondansetron (ZOFRAN-ODT) 8 MG disintegrating tablet Take 1 tablet (8 mg total) by mouth every 8 (eight) hours as needed for nausea or vomiting.  [place]  tablet  0   No current facility-administered medications for this visit.    Family History  Problem Relation Age of Onset  . Heart disease Mother   . Alzheimer's disease Father   . Thyroid disease Mother   . Thyroid disease Maternal Grandmother     ROS:  Pertinent items are noted in HPI.  Otherwise, a comprehensive ROS was negative.  Exam:   BP 132/84  Pulse 58  Resp 16  Ht _0  (1.626 m)  Wt 142 lb 9.6 oz (64.683 kg)  BMI 24.47 kg/m2  LMP 05/25/1999  Weight change: -2lb  Height: _1  (162.6 cm)  Ht Readings from Last 3 Encounters:  09/04/13 _2  (1.626 m)  05/31/13 5' 3.25" (1.607 m)  03/31/13 _3  (1.626 m)    General appearance: alert, cooperative and appears stated age Head: Normocephalic, without obvious abnormality, atraumatic Neck: no adenopathy, supple, symmetrical, trachea midline and thyroid enlarged and nodular Lungs: clear to auscultation bilaterally Breasts: normal appearance, no masses or tenderness Heart: regular rate and rhythm Abdomen: soft, non-tender; bowel sounds normal; no masses,  no organomegaly Extremities:  extremities normal, atraumatic, no cyanosis or edema Skin: Skin color, texture, turgor normal. No rashes or lesions Lymph nodes: Cervical, supraclavicular, and axillary nodes normal. No abnormal inguinal nodes palpated Neurologic: Grossly normal   Pelvic: External genitalia:  no lesions              Urethra:  normal appearing urethra with no masses, tenderness or lesions              Bartholins and Skenes: normal                 Vagina: normal appearing vagina with normal color and discharge, no lesions              Cervix: no lesions              Pap taken: no Bimanual Exam:  Uterus:  normal size, contour, position, consistency, mobility, non-tender              Adnexa: normal adnexa and no mass, fullness, tenderness               Rectovaginal: Confirms               Anus:  normal sphincter tone, no lesions  A:  Well Woman with normal exam PMP, no HRT H/o multinodular goiter.  Followed by Dr. Elyse Hsu H/O breast cancer 4/96 Elevated lipids H/O colonic polyps Osteopenia Tremor  P:   Mammogram yearly pap smear not indicated Stop Evista.  Repeat BMD next year.  Will plan to do MMG with BMD next year. Highly encouraged her to see Dr. Fuller Plan again--GI.  She is getting ready to go on some trips. return annually or prn  An After Visit Summary was printed and given to the patient.

## 2013-09-04 NOTE — Patient Instructions (Signed)

## 2013-09-06 ENCOUNTER — Encounter: Payer: Self-pay | Admitting: Physician Assistant

## 2013-09-06 ENCOUNTER — Ambulatory Visit (INDEPENDENT_AMBULATORY_CARE_PROVIDER_SITE_OTHER): Payer: Medicare Other | Admitting: Obstetrics & Gynecology

## 2013-09-06 ENCOUNTER — Ambulatory Visit (INDEPENDENT_AMBULATORY_CARE_PROVIDER_SITE_OTHER): Payer: Medicare Other

## 2013-09-06 DIAGNOSIS — N839 Noninflammatory disorder of ovary, fallopian tube and broad ligament, unspecified: Secondary | ICD-10-CM

## 2013-09-06 DIAGNOSIS — N838 Other noninflammatory disorders of ovary, fallopian tube and broad ligament: Secondary | ICD-10-CM

## 2013-09-06 DIAGNOSIS — N83209 Unspecified ovarian cyst, unspecified side: Secondary | ICD-10-CM

## 2013-09-06 NOTE — Progress Notes (Signed)
Schedule patient with [facility]  earliest appointment with Nicoletta Ba PA for 4/20 at 10:30.

## 2013-09-06 NOTE — Progress Notes (Signed)
79 y.o.Widowedfemale here for a pelvic ultrasound.  H/O left ovarian cyst noted on CT for evaluation of diverticulitis.    Patient's last menstrual period was 05/25/1999.  FINDINGS: UTERUS: 7.5 x 4.2 x 3.5cm with 75mm fibroid EMS: 1.34mm ADNEXA:   Left ovary 3.5 x 2.5 x 3.9cm with thin walled 3.9 x 3.9 x 4.4cm cyst.  No significant changes.  Superior aspect of cyst appears solid.  Avascular.   Right ovary 1.6 x 1.5 x 1.0cm CUL DE SAC: no free fluid  Images reviewed with pt.  Feel cyst is not growing or shrinking but due to solid area and persistence, should probably be removed.    Procedure discussed with patient.  Hospital stay, recovery and pain management all discussed.  Risks discussed including but not limited to bleeding, 1% risk of receiving a  transfusion, infection, 3-4% risk of bowel/bladder/ureteral/vascular injury discussed as well as possible need for additional surgery if injury does occur discussed.  DVT/PE and rare risk of death discussed.  My actual complications with prior surgeries discussed.  Positioning and incision locations discussed.  Patient aware if pathology abnormal she may need additional treatment.  All questions answered.    Pt is continuing to have LLQ pain that is reminiscent of her diverticulitis issues.  Feels this has never really gone away.  Has not been back to see GI, however.  Advised, I would like her to do this.  Assessment:  Persistent left adnexal mass, most likely benign.    Plan: Laprosocpic BSO.  Feel can do after pt returns from early summer travels. Pt to follow up with GI before leaving on trip.  Appt will be scheduled for her.  ~25 minutes spent with patient >50% of time was in face to face discussion of above.

## 2013-09-10 ENCOUNTER — Ambulatory Visit (INDEPENDENT_AMBULATORY_CARE_PROVIDER_SITE_OTHER): Payer: Medicare Other | Admitting: Physician Assistant

## 2013-09-10 ENCOUNTER — Encounter: Payer: Self-pay | Admitting: Physician Assistant

## 2013-09-10 VITALS — BP 108/78 | HR 71 | Ht 64.0 in | Wt 143.0 lb

## 2013-09-10 DIAGNOSIS — R142 Eructation: Secondary | ICD-10-CM

## 2013-09-10 DIAGNOSIS — K5730 Diverticulosis of large intestine without perforation or abscess without bleeding: Secondary | ICD-10-CM

## 2013-09-10 DIAGNOSIS — Z853 Personal history of malignant neoplasm of breast: Secondary | ICD-10-CM | POA: Insufficient documentation

## 2013-09-10 DIAGNOSIS — R143 Flatulence: Secondary | ICD-10-CM

## 2013-09-10 DIAGNOSIS — K5900 Constipation, unspecified: Secondary | ICD-10-CM

## 2013-09-10 DIAGNOSIS — R140 Abdominal distension (gaseous): Secondary | ICD-10-CM

## 2013-09-10 DIAGNOSIS — R141 Gas pain: Secondary | ICD-10-CM

## 2013-09-10 NOTE — Progress Notes (Signed)
Reviewed and agree with management plan.  Arlis Everly T. Bina Veenstra, MD FACG 

## 2013-09-10 NOTE — Patient Instructions (Signed)
Take Restora, a probiotic, once daily.  You can get this at [employer], or [facility].  Eat 2 figs, or prunes daily. Start Miralax 17 grams in 8 oz of juice or water daily or every other day for constipation.  Call us back in 2 weeks if symptoms have not improved.

## 2013-09-10 NOTE — Progress Notes (Signed)
Subjective:    Patient ID: Michelle Fuller, female    DOB: 1934/08/22, 79 y.o.   MRN: [id number]  HPI  Michelle Fuller is a pleasant 79 year old white female known to Dr. Fuller Plan from prior colonoscopies. Patient has history of hypertension cerebrovascular disease and prior history of breast cancer. She has history of tubovillous adenoma in 2004. She have followup colonoscopy in June of 2011 no recurrent polyps but noted to have moderate diverticulosis from the sigmoid to descending colon. Patient was treated for an episode of diverticulitis in November of 2014 through the emergency room. She did have CT of the abdomen and pelvis confirming the diverticulitis at that time.  She says since then she hasn't ever completely felt back to normal. She's not having any significant pain but complains of generalized abdominal discomfort bloating gassiness and constipation. She says she's tried to increase her fiber but that seems to cause more trouble with abdominal discomfort. She says she has a lot of straining generally no melena or hematochezia. She also feels that  her left abdomen protrudes a bit more than the right side and is concerned about this.    Review of Systems  Constitutional: Negative.   HENT: Negative.   Eyes: Negative.   Respiratory: Negative.   Cardiovascular: Negative.   Gastrointestinal: Positive for constipation and abdominal distention.  Endocrine: Negative.   Genitourinary: Negative.   Musculoskeletal: Negative.   Allergic/Immunologic: Negative.   Neurological: Negative.   Hematological: Negative.   Psychiatric/Behavioral: Negative.    Outpatient Prescriptions Prior to Visit  Medication Sig Dispense Refill  . aspirin 81 MG tablet Take 1 tablet (81 mg total) by mouth daily.  30 tablet    . atorvastatin (LIPITOR) 10 MG tablet Take 10 mg by mouth daily at 12 noon.      . Flaxseed, Linseed, (SM FLAX SEED OIL) 1000 MG CAPS Take by mouth.      . indapamide (LOZOL) 1.25 MG tablet  Take 1.25 mg by mouth every morning.      . Multiple Vitamins-Minerals (MULTIVITAMIN PO) Take by mouth. Centrum silver one  A day      . primidone (MYSOLINE) 50 MG tablet Take 150 mg by mouth daily at 12 noon.      . propranolol ER (INDERAL LA) 160 MG SR capsule Take by mouth daily.      . valsartan (DIOVAN) 320 MG tablet Take 320 mg by mouth daily.      . clonazePAM (KLONOPIN) 0.5 MG tablet Take 1 tablet by mouth 2 (two) times daily.      Marland Kitchen escitalopram (LEXAPRO) 10 MG tablet Take 1 tablet by mouth daily.      . raloxifene (EVISTA) 60 MG tablet Take 60 mg by mouth daily at 12 noon.       No facility-administered medications prior to visit.   Allergies  Allergen Reactions  . Latex Rash  . Flagyl [Metronidazole]     Break out  . Penicillins     REACTION: "broke out"   Patient Active Problem List   Diagnosis Date Noted  . HX: breast cancer 09/10/2013  . Diverticulitis 03/31/2013  . Vasovagal near syncope 03/31/2013  . Visual disturbance, transient 01/16/2013  . Hypertension 01/16/2013  . Hyperlipidemia 01/16/2013  . Cerebrovascular disease 01/16/2013  . Essential tremor 01/16/2013   History  Substance Use Topics  . Smoking status: Never Smoker   . Smokeless tobacco: Never Used  . Alcohol Use: No   family history includes Alzheimer's disease in her  father; Heart disease in her mother; Thyroid disease in her maternal grandmother and mother.     Objective:   Physical Exam  white female in no acute distress, pleasant blood pressure 108/78 pulse 70 one height 5 foot 4 weight 143. HEENT; nontraumatic normocephalic EOMI PERRLA sclera anicteric, Supple ;no JVD, Cardiovascular; regular rate and rhythm with S1-S2 no murmur or gallop, Pulmonary; clear bilaterally, Abdomen soft there is no focal tenderness no guarding or rebound no palpable mass or hepatosplenomegaly bowel sounds are present, Rectal ;exam not done, Extremities ;no clubbing cyanosis or edema skin warm and dry, Psych ;mood  and affect appropriate        Assessment & Plan:  #94  79 year old female with history of diverticular disease and diverticulitis November 2014 with several month history of generalized abdominal discomfort bloating and constipation. I suspect this is more IBS/functional constipation no significant tenderness to support diverticulitis #2 history of adenomatous colon polyps due for followup colonoscopy June 2016  #3 history of breast cancer #4 hypertension #5 hyperlipidemia #6 chronic ovarian cyst-patient says she is to be scheduled to have her ovaries removed at some point in the next couple of months.  Plan; start probiotic daily-she was given samples of  Restora , and  to take a probiotic daily over the next couple of months Add MiraLax 17 g in 8 ounces of water daily or every other day she may adjust the dose as needed. We also discussed adding dried figs or  prunes on a daily basis. Patient is asked to call back in 2 weeks if her symptoms have not improved and then would considerfurther workup

## 2013-09-20 ENCOUNTER — Encounter: Payer: Self-pay | Admitting: Obstetrics & Gynecology

## 2013-09-25 ENCOUNTER — Telehealth: Payer: Self-pay | Admitting: Obstetrics & Gynecology

## 2013-09-25 NOTE — Telephone Encounter (Signed)
Spoke with patient. Advised that per benefits quote received, she will not have any responsibility for surgeons charges.  Patient agreeable.

## 2013-10-01 ENCOUNTER — Telehealth: Payer: Self-pay | Admitting: Obstetrics & Gynecology

## 2013-10-01 NOTE — Telephone Encounter (Signed)
Spoke with patient. Patient called to advised that she would like to make sure that her surgery is scheduled for mid-July or later. [place] will [place] prior to this.

## 2013-10-01 NOTE — Telephone Encounter (Signed)
Pt is returning a call to Sabrina °

## 2013-12-05 ENCOUNTER — Telehealth: Payer: Self-pay | Admitting: Obstetrics & Gynecology

## 2013-12-05 NOTE — Telephone Encounter (Signed)
Pt says she never received a callback to schedule surgery.

## 2013-12-05 NOTE — Telephone Encounter (Signed)
Marisa Sprinkles CMA conatcted patient and reviewed surgery info, instruction sheet and mailed copy. Pre and post op appointments scheduled.  Routing to provider for final review. Patient agreeable to disposition. Will close encounter

## 2013-12-05 NOTE — Telephone Encounter (Signed)
Called patient and notified scheduled for Laparoscopic BSO on 01-07-14 7:30am at [facility] and told patient would mail to her also.

## 2013-12-10 ENCOUNTER — Telehealth: Payer: Self-pay | Admitting: Obstetrics & Gynecology

## 2013-12-10 NOTE — Telephone Encounter (Signed)
Patient is scheduled for preop appt 7/30. She will be out of town for 3 days 29-31. She needs to reschedule the appt. Her surgery is scheduled for 01/07/14

## 2013-12-11 NOTE — Telephone Encounter (Signed)
Patient states she needs to cancel her pre-op because she will be on vacation.  R/s for 12/24/13 at 1600 with Dr. Sabra Heck time and day per Lamont Snowball, RN Routing to provider for final review. Patient agreeable to disposition. Will close encounter

## 2013-12-20 ENCOUNTER — Institutional Professional Consult (permissible substitution): Payer: Medicare Other | Admitting: Obstetrics & Gynecology

## 2013-12-24 ENCOUNTER — Encounter (HOSPITAL_COMMUNITY): Payer: Self-pay | Admitting: Pharmacist

## 2013-12-24 ENCOUNTER — Encounter: Payer: Self-pay | Admitting: Obstetrics & Gynecology

## 2013-12-24 ENCOUNTER — Ambulatory Visit (INDEPENDENT_AMBULATORY_CARE_PROVIDER_SITE_OTHER): Payer: Medicare Other | Admitting: Obstetrics & Gynecology

## 2013-12-24 VITALS — BP 140/80 | HR 64 | Resp 16 | Ht 64.0 in | Wt 145.8 lb

## 2013-12-24 DIAGNOSIS — N83209 Unspecified ovarian cyst, unspecified side: Secondary | ICD-10-CM

## 2013-12-24 MED ORDER — ONDANSETRON HCL 4 MG PO TABS: 4.0000 mg | ORAL_TABLET | Freq: Three times a day (TID) | ORAL | Status: DC | PRN

## 2013-12-24 MED ORDER — HYDROCODONE-ACETAMINOPHEN 5-300 MG PO TABS: 1.0000 | ORAL_TABLET | Freq: Four times a day (QID) | ORAL | Status: DC

## 2013-12-24 NOTE — Progress Notes (Signed)
79 y.o. G3P3 WidowedCaucasian female here for discussion of upcoming procedure.  Laparoscopic BSO planned due to persistnet 4.4cm left ovarian cyst with small, avascular solid component.  Last ultrasound was 09/06/13.  Last ca-125 was 05/31/13.    Procedure discussed with patient.  Hospital stay, recovery and pain management all discussed.  Risks discussed including but not limited to bleeding, rare risk of receiving a  transfusion, infection, 1-2% risk of bowel/bladder/ureteral/vascular injury discussed as well as possible need for additional surgery if injury does occur discussed.  DVT/PE and rare risk of death discussed.  My actual complications with prior surgeries discussed.  Hernia formation discussed.  Positioning and incision locations discussed.  Patient aware if pathology abnormal she may need additional treatment.  All questions answered.     Ob Hx:   Patient's last menstrual period was 05/25/1999.          Sexually active: No.  Birth control: PMP Last pap: 01/25/08 WNL Last MMG: 08/13/13-normal Tobacco: no  Past Surgical History  Procedure Laterality Date  . Breast lumpectomy Right 4/96  . Bilateral cateract surgery  2014  . Hysteroscopy  0/01    Past Medical History  Diagnosis Date  . Hypertension   . Breast cancer     Right breast, status post XRT  . Hyperlipidemia   . Essential tremor   . Depression   . Diverticulosis   . Post-menopausal bleeding 01/2000    Huge polyps exc  . Sleep apnea, obstructive     mild  . Elevated LDL cholesterol level   . Osteopenia 8/97    Spine  . Syncope   . Goiter     multinodular  . Colon polyp   . Ovarian cyst     found on CT scan  . Diverticulitis   . Cataracts, both eyes     Allergies: Latex; Flagyl; and Penicillins  Current Outpatient Prescriptions  Medication Sig Dispense Refill  . atorvastatin (LIPITOR) 10 MG tablet Take 10 mg by mouth daily at 12 noon.      . Calcium Citrate-Vitamin D (CITRACAL PETITES/VITAMIN D PO) Take 2  tablets by mouth 2 (two) times daily.      Marland Kitchen FINACEA 15 % cream       . indapamide (LOZOL) 1.25 MG tablet Take 1.25 mg by mouth every morning.      . Lactobacillus Rhamnosus, GG, (CULTURELLE PO) Take 1 capsule by mouth daily.      . Multiple Vitamins-Minerals (MULTIVITAMIN PO) Take by mouth. Centrum silver one  A day      . primidone (MYSOLINE) 50 MG tablet Take 150 mg by mouth daily at 12 noon.      . propranolol ER (INDERAL LA) 160 MG SR capsule Take by mouth daily.      . valsartan (DIOVAN) 320 MG tablet Take 320 mg by mouth daily.      Marland Kitchen aspirin 81 MG tablet Take 1 tablet (81 mg total) by mouth daily.  30 tablet    . Flaxseed, Linseed, (SM FLAX SEED OIL) 1000 MG CAPS Take by mouth.       No current facility-administered medications for this visit.    ROS: A comprehensive review of systems was negative.  Exam:    BP 140/80  Pulse 64  Resp 16  Ht 5\' 4"  (1.626 m)  Wt 145 lb 12.8 oz (66.134 kg)  BMI 25.01 kg/m2  LMP 05/25/1999  General appearance: alert and cooperative Head: Normocephalic, without obvious abnormality, atraumatic Neck: no adenopathy,  supple, symmetrical, trachea midline and thyroid not enlarged, symmetric, no tenderness/mass/nodules Lungs: clear to auscultation bilaterally Heart: regular rate and rhythm, S1, S2 normal, no murmur, click, rub or gallop Abdomen: soft, non-tender; bowel sounds normal; no masses,  no organomegaly Extremities: extremities normal, atraumatic, no cyanosis or edema Skin: Skin color, texture, turgor normal. No rashes or lesions Lymph nodes: Cervical, supraclavicular, and axillary nodes normal. no inguinal nodes palpated Neurologic: Grossly normal  Pelvic: External genitalia:  no lesions              Urethra: normal appearing urethra with no masses, tenderness or lesions              Bartholins and Skenes: normal                 Vagina: normal appearing vagina with normal color and discharge, no lesions              Cervix: normal  appearance              Pap taken: No.        Bimanual Exam:  Uterus:  uterus is normal size, shape, consistency and nontender                                      Adnexa:    normal adnexa in size, nontender and no masses                                      Rectovaginal: Deferred  A: Persistent left 4.4cm ovarian cyst with small solid component Diverticular disease, much improved Hypertension Hyperlipidemia Essential tremor H/O depression  P:  Laparoscopic BSO planned Rx for Motrin and Percocet given. Medications/Vitamins reviewed.  Pt knows needs to stop aspirin. Hysterectomy brochure given for pre and post op instructions.  ~20 minutes spent with patient >50% of time was in face to face discussion of above.

## 2013-12-24 NOTE — Addendum Note (Signed)
Addended by: Megan Salon on: 12/24/2013 05:06 PM   Modules accepted: Orders

## 2014-01-04 ENCOUNTER — Encounter (HOSPITAL_COMMUNITY)
Admission: RE | Admit: 2014-01-04 | Discharge: 2014-01-04 | Disposition: A | Discharge status: Home / Self Care | Payer: Medicare Other | Source: Ambulatory Visit | Attending: Obstetrics & Gynecology | Admitting: Obstetrics & Gynecology

## 2014-01-04 ENCOUNTER — Encounter (HOSPITAL_COMMUNITY): Payer: Self-pay

## 2014-01-04 ENCOUNTER — Encounter (HOSPITAL_COMMUNITY): Payer: Self-pay | Admitting: Anesthesiology

## 2014-01-04 DIAGNOSIS — F3289 Other specified depressive episodes: Secondary | ICD-10-CM | POA: Diagnosis not present

## 2014-01-04 DIAGNOSIS — G4733 Obstructive sleep apnea (adult) (pediatric): Secondary | ICD-10-CM | POA: Diagnosis not present

## 2014-01-04 DIAGNOSIS — F329 Major depressive disorder, single episode, unspecified: Secondary | ICD-10-CM | POA: Diagnosis not present

## 2014-01-04 DIAGNOSIS — Z853 Personal history of malignant neoplasm of breast: Secondary | ICD-10-CM | POA: Diagnosis not present

## 2014-01-04 DIAGNOSIS — D279 Benign neoplasm of unspecified ovary: Secondary | ICD-10-CM | POA: Diagnosis not present

## 2014-01-04 DIAGNOSIS — I10 Essential (primary) hypertension: Secondary | ICD-10-CM | POA: Diagnosis not present

## 2014-01-04 DIAGNOSIS — N83209 Unspecified ovarian cyst, unspecified side: Secondary | ICD-10-CM | POA: Diagnosis not present

## 2014-01-04 HISTORY — DX: Chest pain, unspecified: R07.9

## 2014-01-04 LAB — BASIC METABOLIC PANEL
Anion gap: 9 (ref 5–15)
BUN: 15 mg/dL (ref 6–23)
CALCIUM: 10.3 mg/dL (ref 8.4–10.5)
CO2: 31 mEq/L (ref 19–32)
CREATININE: 0.77 mg/dL (ref 0.50–1.10)
Chloride: 95 mEq/L — ABNORMAL LOW (ref 96–112)
GFR calc Af Amer: >90 mL/min (ref >90)
GFR calc non Af Amer: 79 mL/min — ABNORMAL LOW (ref >90)
GLUCOSE: 92 mg/dL (ref 70–99)
Potassium: 4.4 mEq/L (ref 3.7–5.3)
Sodium: 135 mEq/L — ABNORMAL LOW (ref 137–147)

## 2014-01-04 LAB — CBC
HCT: 45.3 % (ref 36.0–46.0)
HEMOGLOBIN: 16 g/dL — AB (ref 12.0–15.0)
MCH: 31.3 pg (ref 26.0–34.0)
MCHC: 35.3 g/dL (ref 30.0–36.0)
MCV: 88.6 fL (ref 78.0–100.0)
Platelets: 237 10*3/uL (ref 150–400)
RBC: 5.11 MIL/uL (ref 3.87–5.11)
RDW: 13.1 % (ref 11.5–15.5)
WBC: 5.8 10*3/uL (ref 4.0–10.5)

## 2014-01-04 NOTE — Patient Instructions (Signed)
Your procedure is scheduled on:01/07/14  Enter through the Main Entrance at :6am Pick up desk phone and dial [phone] and inform us of your arrival.  Please call [phone] if you have any problems the morning of surgery.  Remember: Do not eat food or drink liquids, including water, after midnight:Sunday   You may brush your teeth the morning of surgery.  No meds the morning of surgery.  DO NOT wear jewelry, eye make-up, lipstick,body lotion, or dark fingernail polish.  (Polished toes are ok) You may wear deodorant.  Patients discharged on the day of surgery will not be allowed to drive home. Wear loose fitting, comfortable clothes for your ride home.

## 2014-01-06 MED ORDER — CLINDAMYCIN PHOSPHATE 900 MG/50ML IV SOLN
900.0000 mg | Freq: Once | INTRAVENOUS | Status: AC
  Administered 2014-01-07: 900 mg via INTRAVENOUS
  Filled 2014-01-06: qty 50

## 2014-01-07 ENCOUNTER — Ambulatory Visit (HOSPITAL_COMMUNITY): Payer: Medicare Other | Admitting: Anesthesiology

## 2014-01-07 ENCOUNTER — Observation Stay (HOSPITAL_COMMUNITY)
Admission: RE | Admit: 2014-01-07 | Discharge: 2014-01-08 | Disposition: A | Discharge status: Home / Self Care | Payer: Medicare Other | Source: Ambulatory Visit | Attending: Obstetrics & Gynecology | Admitting: Obstetrics & Gynecology

## 2014-01-07 ENCOUNTER — Encounter (HOSPITAL_COMMUNITY): Payer: Medicare Other | Admitting: Anesthesiology

## 2014-01-07 ENCOUNTER — Encounter (HOSPITAL_COMMUNITY)
Admission: RE | Disposition: A | Discharge status: Home / Self Care | Payer: Self-pay | Source: Ambulatory Visit | Attending: Obstetrics & Gynecology

## 2014-01-07 ENCOUNTER — Encounter (HOSPITAL_COMMUNITY): Payer: Self-pay | Admitting: Anesthesiology

## 2014-01-07 DIAGNOSIS — N83209 Unspecified ovarian cyst, unspecified side: Secondary | ICD-10-CM | POA: Diagnosis not present

## 2014-01-07 DIAGNOSIS — D279 Benign neoplasm of unspecified ovary: Secondary | ICD-10-CM | POA: Insufficient documentation

## 2014-01-07 DIAGNOSIS — F3289 Other specified depressive episodes: Secondary | ICD-10-CM | POA: Insufficient documentation

## 2014-01-07 DIAGNOSIS — I10 Essential (primary) hypertension: Secondary | ICD-10-CM | POA: Insufficient documentation

## 2014-01-07 DIAGNOSIS — G4733 Obstructive sleep apnea (adult) (pediatric): Secondary | ICD-10-CM | POA: Diagnosis not present

## 2014-01-07 DIAGNOSIS — F329 Major depressive disorder, single episode, unspecified: Secondary | ICD-10-CM | POA: Insufficient documentation

## 2014-01-07 DIAGNOSIS — R110 Nausea: Secondary | ICD-10-CM | POA: Diagnosis present

## 2014-01-07 DIAGNOSIS — Z853 Personal history of malignant neoplasm of breast: Secondary | ICD-10-CM | POA: Insufficient documentation

## 2014-01-07 HISTORY — PX: LAPAROSCOPIC BILATERAL SALPINGO OOPHERECTOMY: SHX5890

## 2014-01-07 SURGERY — SALPINGO-OOPHORECTOMY, BILATERAL, LAPAROSCOPIC
Anesthesia: General | Site: Abdomen | Laterality: Bilateral

## 2014-01-07 MED ORDER — FENTANYL CITRATE 0.05 MG/ML IJ SOLN
25.0000 ug | INTRAMUSCULAR | Status: DC | PRN
  Administered 2014-01-07: 25 ug via INTRAVENOUS

## 2014-01-07 MED ORDER — NEOSTIGMINE METHYLSULFATE 10 MG/10ML IV SOLN
INTRAVENOUS | Status: AC
  Filled 2014-01-07: qty 1

## 2014-01-07 MED ORDER — NEOSTIGMINE METHYLSULFATE 10 MG/10ML IV SOLN
INTRAVENOUS | Status: DC | PRN
Start: 2014-01-07 — End: 2014-01-07
  Administered 2014-01-07: 2.5 mg via INTRAVENOUS

## 2014-01-07 MED ORDER — ALUM & MAG HYDROXIDE-SIMETH 200-200-20 MG/5ML PO SUSP: 30.0000 mL | ORAL | Status: DC | PRN

## 2014-01-07 MED ORDER — ACETAMINOPHEN 325 MG PO TABS: 650.0000 mg | ORAL_TABLET | ORAL | Status: DC | PRN

## 2014-01-07 MED ORDER — KETOROLAC TROMETHAMINE 15 MG/ML IJ SOLN
15.0000 mg | Freq: Four times a day (QID) | INTRAMUSCULAR | Status: DC
  Filled 2014-01-07 (×4): qty 1

## 2014-01-07 MED ORDER — KETOROLAC TROMETHAMINE 15 MG/ML IJ SOLN
15.0000 mg | Freq: Four times a day (QID) | INTRAMUSCULAR | Status: DC
  Administered 2014-01-07: 15 mg via INTRAVENOUS
  Filled 2014-01-07 (×3): qty 1

## 2014-01-07 MED ORDER — ONDANSETRON HCL 4 MG/2ML IJ SOLN
INTRAMUSCULAR | Status: AC
  Filled 2014-01-07: qty 2

## 2014-01-07 MED ORDER — IBUPROFEN 600 MG PO TABS
600.0000 mg | ORAL_TABLET | Freq: Four times a day (QID) | ORAL | Status: DC | PRN
  Administered 2014-01-08: 600 mg via ORAL
  Filled 2014-01-07: qty 1

## 2014-01-07 MED ORDER — LIDOCAINE HCL (CARDIAC) 20 MG/ML IV SOLN
INTRAVENOUS | Status: AC
  Filled 2014-01-07: qty 5

## 2014-01-07 MED ORDER — FENTANYL CITRATE 0.05 MG/ML IJ SOLN
INTRAMUSCULAR | Status: AC
  Filled 2014-01-07: qty 5

## 2014-01-07 MED ORDER — KETOROLAC TROMETHAMINE 30 MG/ML IJ SOLN
30.0000 mg | Freq: Four times a day (QID) | INTRAMUSCULAR | Status: DC
  Administered 2014-01-07 (×2): 15 mg via INTRAVENOUS
  Filled 2014-01-07: qty 1

## 2014-01-07 MED ORDER — ONDANSETRON HCL 4 MG/2ML IJ SOLN
INTRAMUSCULAR | Status: DC | PRN
  Administered 2014-01-07: 4 mg via INTRAVENOUS

## 2014-01-07 MED ORDER — MENTHOL 3 MG MT LOZG: 1.0000 | LOZENGE | OROMUCOSAL | Status: DC | PRN

## 2014-01-07 MED ORDER — GLYCOPYRROLATE 0.2 MG/ML IJ SOLN
INTRAMUSCULAR | Status: DC | PRN
  Administered 2014-01-07: 0.1 mg via INTRAVENOUS
  Administered 2014-01-07: 0.4 mg via INTRAVENOUS

## 2014-01-07 MED ORDER — SODIUM CHLORIDE 0.9 % IJ SOLN
INTRAMUSCULAR | Status: DC | PRN
Start: 2014-01-07 — End: 2014-01-07
  Administered 2014-01-07: 10 mL

## 2014-01-07 MED ORDER — ONDANSETRON HCL 4 MG PO TABS: 4.0000 mg | ORAL_TABLET | Freq: Three times a day (TID) | ORAL | Status: DC | PRN

## 2014-01-07 MED ORDER — PROPOFOL 10 MG/ML IV EMUL
INTRAVENOUS | Status: AC
  Filled 2014-01-07: qty 20

## 2014-01-07 MED ORDER — SODIUM CHLORIDE 0.9 % IJ SOLN
INTRAMUSCULAR | Status: AC
  Filled 2014-01-07: qty 10

## 2014-01-07 MED ORDER — LIDOCAINE HCL (CARDIAC) 20 MG/ML IV SOLN
INTRAVENOUS | Status: DC | PRN
  Administered 2014-01-07: 50 mg via INTRAVENOUS

## 2014-01-07 MED ORDER — PROPOFOL 10 MG/ML IV BOLUS
INTRAVENOUS | Status: DC | PRN
  Administered 2014-01-07: 40 mg via INTRAVENOUS
  Administered 2014-01-07: 100 mg via INTRAVENOUS

## 2014-01-07 MED ORDER — PROMETHAZINE HCL 25 MG/ML IJ SOLN
6.2500 mg | INTRAMUSCULAR | Status: DC | PRN
  Administered 2014-01-07: 6.25 mg via INTRAVENOUS

## 2014-01-07 MED ORDER — SIMETHICONE 80 MG PO CHEW
80.0000 mg | CHEWABLE_TABLET | Freq: Four times a day (QID) | ORAL | Status: DC | PRN
Start: 2014-01-07 — End: 2014-01-08

## 2014-01-07 MED ORDER — MIDAZOLAM HCL 2 MG/2ML IJ SOLN
INTRAMUSCULAR | Status: AC
  Filled 2014-01-07: qty 2

## 2014-01-07 MED ORDER — HYDROCODONE-ACETAMINOPHEN 5-325 MG PO TABS: 1.0000 | ORAL_TABLET | Freq: Four times a day (QID) | ORAL | Status: DC | PRN

## 2014-01-07 MED ORDER — DEXTROSE-NACL 5-0.45 % IV SOLN
INTRAVENOUS | Status: DC
  Administered 2014-01-07: 17:00:00 via INTRAVENOUS

## 2014-01-07 MED ORDER — KETOROLAC TROMETHAMINE 30 MG/ML IJ SOLN
INTRAMUSCULAR | Status: AC
  Filled 2014-01-07: qty 1

## 2014-01-07 MED ORDER — FENTANYL CITRATE 0.05 MG/ML IJ SOLN: 25.0000 ug | INTRAMUSCULAR | Status: DC | PRN

## 2014-01-07 MED ORDER — LACTATED RINGERS IR SOLN
Status: DC | PRN
  Administered 2014-01-07: 3000 mL

## 2014-01-07 MED ORDER — FENTANYL CITRATE 0.05 MG/ML IJ SOLN
INTRAMUSCULAR | Status: AC
  Administered 2014-01-07: 25 ug via INTRAVENOUS
  Filled 2014-01-07: qty 2

## 2014-01-07 MED ORDER — PANTOPRAZOLE SODIUM 40 MG IV SOLR
40.0000 mg | Freq: Every day | INTRAVENOUS | Status: DC
  Administered 2014-01-07: 40 mg via INTRAVENOUS
  Filled 2014-01-07: qty 40

## 2014-01-07 MED ORDER — MEPERIDINE HCL 25 MG/ML IJ SOLN: 6.2500 mg | INTRAMUSCULAR | Status: DC | PRN

## 2014-01-07 MED ORDER — DEXAMETHASONE SODIUM PHOSPHATE 10 MG/ML IJ SOLN
INTRAMUSCULAR | Status: DC | PRN
  Administered 2014-01-07: 4 mg via INTRAVENOUS

## 2014-01-07 MED ORDER — DEXAMETHASONE SODIUM PHOSPHATE 10 MG/ML IJ SOLN
INTRAMUSCULAR | Status: AC
  Filled 2014-01-07: qty 1

## 2014-01-07 MED ORDER — KETOROLAC TROMETHAMINE 30 MG/ML IJ SOLN: 30.0000 mg | Freq: Four times a day (QID) | INTRAMUSCULAR | Status: DC

## 2014-01-07 MED ORDER — LACTATED RINGERS IV SOLN
INTRAVENOUS | Status: DC
  Administered 2014-01-07: 07:00:00 via INTRAVENOUS

## 2014-01-07 MED ORDER — ONDANSETRON HCL 4 MG/2ML IJ SOLN
INTRAMUSCULAR | Status: AC
  Administered 2014-01-07: 4 mg via INTRAVENOUS
  Filled 2014-01-07: qty 2

## 2014-01-07 MED ORDER — ROCURONIUM BROMIDE 100 MG/10ML IV SOLN
INTRAVENOUS | Status: DC | PRN
  Administered 2014-01-07: 25 mg via INTRAVENOUS
  Administered 2014-01-07: 5 mg via INTRAVENOUS
  Administered 2014-01-07: 10 mg via INTRAVENOUS

## 2014-01-07 MED ORDER — BUPIVACAINE HCL (PF) 0.25 % IJ SOLN
INTRAMUSCULAR | Status: DC | PRN
  Administered 2014-01-07: 10 mL

## 2014-01-07 MED ORDER — ROCURONIUM BROMIDE 100 MG/10ML IV SOLN
INTRAVENOUS | Status: AC
  Filled 2014-01-07: qty 1

## 2014-01-07 MED ORDER — FENTANYL CITRATE 0.05 MG/ML IJ SOLN
INTRAMUSCULAR | Status: DC | PRN
  Administered 2014-01-07 (×3): 50 ug via INTRAVENOUS

## 2014-01-07 MED ORDER — MIDAZOLAM HCL 2 MG/2ML IJ SOLN
INTRAMUSCULAR | Status: DC | PRN
  Administered 2014-01-07: 1 mg via INTRAVENOUS

## 2014-01-07 MED ORDER — BUPIVACAINE HCL (PF) 0.25 % IJ SOLN
INTRAMUSCULAR | Status: AC
  Filled 2014-01-07: qty 30

## 2014-01-07 MED ORDER — PROMETHAZINE HCL 25 MG/ML IJ SOLN
INTRAMUSCULAR | Status: AC
  Administered 2014-01-07: 6.25 mg via INTRAVENOUS
  Filled 2014-01-07: qty 1

## 2014-01-07 MED ORDER — ONDANSETRON HCL 4 MG/2ML IJ SOLN
4.0000 mg | Freq: Once | INTRAMUSCULAR | Status: AC
  Administered 2014-01-07: 4 mg via INTRAVENOUS

## 2014-01-07 SURGICAL SUPPLY — 40 items
ADH SKN CLS APL DERMABOND .7 (GAUZE/BANDAGES/DRESSINGS)
APL SKNCLS STERI-STRIP NONHPOA (GAUZE/BANDAGES/DRESSINGS)
BAG SPEC RTRVL LRG 6X4 10 (ENDOMECHANICALS) ×1
BENZOIN TINCTURE PRP APPL 2/3 (GAUZE/BANDAGES/DRESSINGS) IMPLANT
CABLE HIGH FREQUENCY MONO STRZ (ELECTRODE) ×3 IMPLANT
CATH ROBINSON RED A/P 16FR (CATHETERS) IMPLANT
CHLORAPREP W/TINT 26ML (MISCELLANEOUS) ×3 IMPLANT
CLOSURE WOUND 1/2 X4 (GAUZE/BANDAGES/DRESSINGS)
CLOSURE WOUND 1/4X4 (GAUZE/BANDAGES/DRESSINGS) ×1
CLOTH BEACON ORANGE TIMEOUT ST (SAFETY) ×3 IMPLANT
DERMABOND ADVANCED (GAUZE/BANDAGES/DRESSINGS)
DERMABOND ADVANCED .7 DNX12 (GAUZE/BANDAGES/DRESSINGS) IMPLANT
DRSG COVADERM PLUS 2X2 (GAUZE/BANDAGES/DRESSINGS) ×4 IMPLANT
DRSG OPSITE POSTOP 3X4 (GAUZE/BANDAGES/DRESSINGS) ×2 IMPLANT
FORCEPS CUTTING 33CM 5MM (CUTTING FORCEPS) IMPLANT
GLOVE BIOGEL PI IND STRL 7.0 (GLOVE) ×2 IMPLANT
GLOVE BIOGEL PI INDICATOR 7.0 (GLOVE) ×4
GLOVE ECLIPSE 6.5 STRL STRAW (GLOVE) ×6 IMPLANT
GOWN STRL REUS W/TWL LRG LVL3 (GOWN DISPOSABLE) ×3 IMPLANT
NEEDLE INSUFFLATION 120MM (ENDOMECHANICALS) ×3 IMPLANT
PACK LAPAROSCOPY BASIN (CUSTOM PROCEDURE TRAY) ×3 IMPLANT
POUCH SPECIMEN RETRIEVAL 10MM (ENDOMECHANICALS) ×2 IMPLANT
PROTECTOR NERVE ULNAR (MISCELLANEOUS) ×3 IMPLANT
SEALER TISSUE G2 CVD JAW 35 (ENDOMECHANICALS) IMPLANT
SEALER TISSUE G2 CVD JAW 45CM (ENDOMECHANICALS)
SET IRRIG TUBING LAPAROSCOPIC (IRRIGATION / IRRIGATOR) ×2 IMPLANT
STRIP CLOSURE SKIN 1/2X4 (GAUZE/BANDAGES/DRESSINGS) IMPLANT
STRIP CLOSURE SKIN 1/4X4 (GAUZE/BANDAGES/DRESSINGS) ×2 IMPLANT
SUT VIC AB 2-0 SH 27 (SUTURE) ×3
SUT VIC AB 2-0 SH 27XBRD (SUTURE) IMPLANT
SUT VICRYL 0 UR6 27IN ABS (SUTURE) IMPLANT
SUT VICRYL 4-0 PS2 18IN ABS (SUTURE) ×3 IMPLANT
SYR 30ML LL (SYRINGE) IMPLANT
TOWEL OR 17X24 6PK STRL BLUE (TOWEL DISPOSABLE) ×6 IMPLANT
TRAY FOLEY CATH 14FR (SET/KITS/TRAYS/PACK) ×3 IMPLANT
TROCAR BALLN 12MMX100 BLUNT (TROCAR) IMPLANT
TROCAR XCEL NON-BLD 11X100MML (ENDOMECHANICALS) IMPLANT
TROCAR XCEL NON-BLD 5MMX100MML (ENDOMECHANICALS) IMPLANT
WARMER LAPAROSCOPE (MISCELLANEOUS) ×3 IMPLANT
WATER STERILE IRR 1000ML POUR (IV SOLUTION) ×3 IMPLANT

## 2014-01-07 NOTE — H&P (Signed)
Michelle Fuller is an 79 y.o. female  G1P3 WWF with a 4.4cm left ovarian cyst with small, abascular solid component.  This has been imaged without change and a ca-125 has been normal (05/31/13).  She has had issues with diverticular disease this year and then due to need for travel, surgery scheduled later in the year than originally planned with pt.  However, she is doing much better from a GI standpoint so this is a much better time to proceed.  Pertinent Gynecological History: Menses: post-menopausal Bleeding: none Contraception: post menopausal status DES exposure: denies Blood transfusions: none Sexually transmitted diseases: no past history Previous GYN Procedures: none  Last mammogram: normal Date: 4/15 Last pap: normal Date: 9/09 (we have stopped Pap smears) OB History: G3, P3   Menstrual History: Patient's last menstrual period was 05/25/1999.    Past Medical History  Diagnosis Date  . Hypertension   . Breast cancer     Right breast, status post XRT  . Hyperlipidemia   . Essential tremor   . Depression   . Diverticulosis   . Post-menopausal bleeding 01/2000    Huge polyps exc  . Sleep apnea, obstructive     mild  . Elevated LDL cholesterol level   . Osteopenia 8/97    Spine  . Syncope   . Goiter     multinodular  . Colon polyp   . Ovarian cyst     found on CT scan  . Cataracts, both eyes   . Chest pain 2015    recent episode while in sun- recovered in shade with water    Past Surgical History  Procedure Laterality Date  . Breast lumpectomy Right 4/96  . Bilateral cateract surgery  2014  . Hysteroscopy  0/01    Family History  Problem Relation Age of Onset  . Heart disease Mother   . Alzheimer's disease Father   . Thyroid disease Mother   . Thyroid disease Maternal Grandmother     Social History:  reports that she has never smoked. She has never used smokeless tobacco. She reports that she does not drink alcohol or use illicit drugs.  Allergies:   Allergies  Allergen Reactions  . Latex Itching and Rash  . Flagyl [Metronidazole] Hives    Break out  . Penicillins Hives and Rash    REACTION: "broke out"    Prescriptions prior to admission  Medication Sig Dispense Refill  . aspirin 81 MG tablet Take 1 tablet (81 mg total) by mouth daily.  30 tablet    . atorvastatin (LIPITOR) 10 MG tablet Take 10 mg by mouth daily at 12 noon.      . Calcium Citrate-Vitamin D (CITRACAL PETITES/VITAMIN D PO) Take 2 tablets by mouth 2 (two) times daily.      Marland Kitchen FINACEA 15 % cream       . Flaxseed, Linseed, (SM FLAX SEED OIL) 1000 MG CAPS Take by mouth.      . indapamide (LOZOL) 1.25 MG tablet Take 1.25 mg by mouth every morning.      . Lactobacillus Rhamnosus, GG, (CULTURELLE PO) Take 1 capsule by mouth daily.      . Multiple Vitamins-Minerals (MULTIVITAMIN PO) Take by mouth. Centrum silver one  A day      . primidone (MYSOLINE) 50 MG tablet Take 150 mg by mouth daily at 12 noon.      . propranolol ER (INDERAL LA) 160 MG SR capsule Take by mouth daily.      Marland Kitchen  valsartan (DIOVAN) 320 MG tablet Take 320 mg by mouth daily.      . Hydrocodone-Acetaminophen (VICODIN) 5-300 MG TABS Take 1 tablet by mouth every 6 (six) hours. May take two tablets if needed  15 each  0  . ondansetron (ZOFRAN) 4 MG tablet Take 1 tablet (4 mg total) by mouth every 8 (eight) hours as needed for nausea or vomiting.  10 tablet  0    ROS  Blood pressure 146/77, pulse 72, temperature 97.9 F (36.6 C), temperature source Oral, resp. rate 18, last menstrual period 05/25/1999, SpO2 95.00%. Physical Exam  Constitutional: She is oriented to person, place, and time. She appears well-developed and well-nourished.  Neck: Normal range of motion. Neck supple. No thyromegaly present.  Cardiovascular: Normal rate and regular rhythm.   Respiratory: Effort normal and breath sounds normal.  GI: Soft. Bowel sounds are normal. She exhibits no distension. There is no tenderness.  Lymphadenopathy:     She has no cervical adenopathy.  Neurological: She is alert and oriented to person, place, and time.  Skin: Skin is warm and dry.  Psychiatric: She has a normal mood and affect.    No results found for this or any previous visit (from the past 24 hour(s)).  No results found.  Assessment/Plan: 79 yo MWF with persistent 4.4cm ovarian cyst with small avascular solid component here for laparoscopic BSO.    Hale Bogus [facility] 01/07/2014, 6:35 AM

## 2014-01-07 NOTE — Addendum Note (Signed)
Addendum created 01/07/14 1522 by Ignacia Bayley, CRNA   Modules edited: Notes Section   Notes Section:  File: [id number]

## 2014-01-07 NOTE — Anesthesia Preprocedure Evaluation (Addendum)
Anesthesia Evaluation  Patient identified by MRN, date of birth, ID band Patient awake    Reviewed: Allergy & Precautions, H&P , Patient's Chart, lab work & pertinent test results, reviewed documented beta blocker date and time   History of Anesthesia Complications Negative for: history of anesthetic complications  Airway Mallampati: II TM Distance: >3 FB Neck ROM: full    Dental   Pulmonary sleep apnea ,  breath sounds clear to auscultation        Cardiovascular Exercise Tolerance: Good hypertension, Rhythm:regular Rate:Normal     Neuro/Psych PSYCHIATRIC DISORDERS Depression    GI/Hepatic   Endo/Other    Renal/GU      Musculoskeletal   Abdominal   Peds  Hematology   Anesthesia Other Findings   Reproductive/Obstetrics                        Anesthesia Physical Anesthesia Plan  ASA: II  Anesthesia Plan: General ETT   Post-op Pain Management:    Induction:   Airway Management Planned:   Additional Equipment:   Intra-op Plan:   Post-operative Plan:   Informed Consent: I have reviewed the patients History and Physical, chart, labs and discussed the procedure including the risks, benefits and alternatives for the proposed anesthesia with the patient or authorized representative who has indicated his/her understanding and acceptance.   Dental Advisory Given  Plan Discussed with: CRNA and Surgeon  Anesthesia Plan Comments:         Anesthesia Quick Evaluation IV phenylephrine gtt

## 2014-01-07 NOTE — Op Note (Signed)
01/07/2014  9:26 AM  PATIENT:  Michelle Fuller  79 y.o. female  PRE-OPERATIVE DIAGNOSIS:  4 cm ovarian mass with small solid component, h/o diverticulosis, nl ca-125  POST-OPERATIVE DIAGNOSIS:  4 cm ovarian mass  PROCEDURE:  Procedure(s): LAPAROSCOPIC BILATERAL SALPINGO OOPHORECTOMY  SURGEON:  Garrie Elenes SUZANNE  ASSISTANTS: Josefa Half   ANESTHESIA:   general  ESTIMATED BLOOD LOSS:25cc  BLOOD ADMINISTERED:none   FLUIDS: 1000cc LR  UOP: 200cc UOP  SPECIMEN:  Bilateral tubes and ovaries, right tube and ovary was tagged with a suture  DISPOSITION OF SPECIMEN:  PATHOLOGY  FINDINGS: Enlarged, smooth left ovary with solid component, mobile without adhesion  DESCRIPTION OF OPERATION: Patient is taken to the operating room. She is placed in the supine position. She is a running IV in place. Informed consent was present on the chart. SCDs on her lower extremities and functioning properly. General endotracheal anesthesia was administered by the anesthesia staff without difficulty. Once adequate anesthesia was confirmed the legs are placed in the low lithotomy position in [place].  Her arms were tucked by the side. Chlor prep was then used to prep the abdomen and Betadine was used to prep the inner thighs, perineum and vagina. Once 3 minutes had past the patient was draped in a normal standard fashion. The legs were lifted to the high lithotomy position. The cervix was visualized by placing a bivalve speculum in the vaginal. The anterior lip of the cervix was grasped with a single toothed tenaculum. A Hulka clamp was placed in the endocervical canal as a means of manipulating the uterus during the procedure. The tenaculum was removed. There is also good manipulation of the uterus. The speculum was removed as well. A Foley catheter was placed to straight drain. Legs were lowered to the low lithotomy position and attention was turned the abdomen.  Attention was turned to the  abdomen. The umbilicus was anesthetized with .025% Marcaine. The skin was incised with a #11 blade. The abdomen was elevated and a 10 millimeter port with non-bladed trocar were passed directly to the abdomen. Intraperitoneal placement was confirmed. Port site locations for the RLQ and LLQ ports were chosen. The skin was transilluminated and the skin was anesthetized with ropivacaine mixture. Epigastric vessels were identified.  67mm skin incisions were made and 77mm nondisposable trocar ports were passed directly into the abdomen. Upper abdomen and pelvis was surveyed. Pt was placed in Trendelenburg position. No abnormal findings were noted except for the enlarged left ovary. Ureters were identified.  Attention was turned to the left side. With uterus on stretch the left IP ligament was serially clamped cauterized and incised. The mesosalpinx on this side was serially clamped, cauterized, and incised.  The utero-ovarian pedicle was clamped, cauterized, and incised.  There was a small amount of bleeding at this pedicle which was made hemostatic.  The left tube and ovary was placed in the pelvis.   Attention was then turned to the left side.  Uterus was placed on stretch to the left.  The IP ligament on the right was serially clamped, cauterized, and incised.  Then the mesosalpinx was clamped, cauterized, and incised.  Finally the utero-ovarian pedicle was serially clamped, cauterized, and incised.  No bleeding was noted. Using and endocatch bag, the specimens were placed in it and brought to the umbilicus.  The cyst on the right was opened in the bag and drained.  There was no intraperitoneal spillage.  The specimens were then removed and the left labeled  with a stitch.   All pedicles were inspected. No bleeding was noted. Pelvic was irrigated.  The ureters were seen again peristalsing well below the level of cautery of the IP ligaments.   Ports were removed. Patient was taken out of Trendelenburg.   Pneumoperitoneum was relieved.  The midline incision was closed at the fascial layer with 0 Vicryl with figure of eight suture. Incisions were closed with 3.0 Vicryl and then Dermabond.  Skin was cleansed of prep.  The Foley catheter was removed as well at the vaginal instrument.  Sponge, lap, needle, and instrument counts were correct x2. Patient tolerated the procedure very well, was extubated, and taken to the recovery room in stable condition.  COUNTS:  YES  PLAN OF CARE: Transfer to PACU

## 2014-01-07 NOTE — Anesthesia Postprocedure Evaluation (Signed)
  Anesthesia Post-op Note  Patient: Michelle Fuller  Procedure(s) Performed: Procedure(s): LAPAROSCOPIC BILATERAL SALPINGO OOPHORECTOMY (Bilateral) Patient is awake and responsive. Pain and nausea are reasonably well controlled. Vital signs are stable and clinically acceptable. Oxygen saturation is clinically acceptable. There are no apparent anesthetic complications at this time. Patient is ready for discharge.

## 2014-01-07 NOTE — Anesthesia Procedure Notes (Signed)
Procedure Name: Intubation Date/Time: 01/07/2014 7:48 AM Performed by: Flossie Dibble Pre-anesthesia Checklist: Suction available, Emergency Drugs available, Timeout performed, Patient being monitored and Patient identified Patient Re-evaluated:Patient Re-evaluated prior to inductionOxygen Delivery Method: Circle system utilized Preoxygenation: Pre-oxygenation with 100% oxygen Intubation Type: IV induction Ventilation: Oral airway inserted - appropriate to patient size and Mask ventilation without difficulty Laryngoscope Size: Mac and 3 Grade View: Grade II Tube size: 7.0 mm Number of attempts: 1 Airway Equipment and Method: Stylet Placement Confirmation: ETT inserted through vocal cords under direct vision,  positive ETCO2 and breath sounds checked- equal and bilateral Secured at: 21 cm Tube secured with: Tape Dental Injury: Teeth and Oropharynx as per pre-operative assessment

## 2014-01-07 NOTE — Transfer of Care (Signed)
Immediate Anesthesia Transfer of Care Note  Patient: Michelle Fuller  Procedure(s) Performed: Procedure(s): LAPAROSCOPIC BILATERAL SALPINGO OOPHORECTOMY (Bilateral)  Patient Location: PACU  Anesthesia Type:General  Level of Consciousness: awake, alert  and oriented  Airway & Oxygen Therapy: Patient Spontanous Breathing and Patient connected to nasal cannula oxygen  Post-op Assessment: Report given to PACU RN and Post -op Vital signs reviewed and stable  Post vital signs: Reviewed and stable  Complications: No apparent anesthesia complications

## 2014-01-07 NOTE — Anesthesia Postprocedure Evaluation (Signed)
  Anesthesia Post-op Note  Patient: Michelle Fuller  Procedure(s) Performed: Procedure(s): LAPAROSCOPIC BILATERAL SALPINGO OOPHORECTOMY (Bilateral)  Patient Location: [facility]  Anesthesia Type:General  Level of Consciousness: awake  Airway and Oxygen Therapy: Patient Spontanous Breathing  Post-op Pain: moderate  Post-op Assessment: Patient's Cardiovascular Status Stable and Respiratory Function Stable  Post-op Vital Signs: stable  Last Vitals:  Filed Vitals:   01/07/14 1345  BP: 139/55  Pulse: 82  Temp: 35.6 C  Resp:     Complications: No apparent anesthesia complications

## 2014-01-07 NOTE — Progress Notes (Signed)
Pt. c/o not getting enough air in when she is breathing.  Vital signs are stable.  Lung sounds are clear.  Comfort measures given.  Dr. Lyndle Herrlich made aware.  Dr. Glennon Mac wants oxygen nasal cannula discontinued and to monitor pts. oxygen saturation on room air.

## 2014-01-08 ENCOUNTER — Encounter (HOSPITAL_COMMUNITY): Payer: Self-pay | Admitting: Obstetrics & Gynecology

## 2014-01-08 DIAGNOSIS — N83209 Unspecified ovarian cyst, unspecified side: Secondary | ICD-10-CM | POA: Diagnosis not present

## 2014-01-08 LAB — COMPREHENSIVE METABOLIC PANEL
ALK PHOS: 59 U/L (ref 39–117)
ALT: 17 U/L (ref 0–35)
ANION GAP: 11 (ref 5–15)
AST: 41 U/L — ABNORMAL HIGH (ref 0–37)
Albumin: 3.2 g/dL — ABNORMAL LOW (ref 3.5–5.2)
BILIRUBIN TOTAL: 0.4 mg/dL (ref 0.3–1.2)
BUN: 14 mg/dL (ref 6–23)
CHLORIDE: 103 meq/L (ref 96–112)
CO2: 23 mEq/L (ref 19–32)
Calcium: 8.9 mg/dL (ref 8.4–10.5)
Creatinine, Ser: 0.91 mg/dL (ref 0.50–1.10)
GFR calc Af Amer: 69 mL/min — ABNORMAL LOW (ref >90)
GFR calc non Af Amer: 59 mL/min — ABNORMAL LOW (ref >90)
Glucose, Bld: 95 mg/dL (ref 70–99)
Potassium: 4.6 mEq/L (ref 3.7–5.3)
Sodium: 137 mEq/L (ref 137–147)
Total Protein: 6.2 g/dL (ref 6.0–8.3)

## 2014-01-08 MED ORDER — PRIMIDONE 50 MG PO TABS
50.0000 mg | ORAL_TABLET | ORAL | Status: DC
  Filled 2014-01-08: qty 1

## 2014-01-08 MED ORDER — ACETAMINOPHEN 500 MG PO TABS
1000.0000 mg | ORAL_TABLET | ORAL | Status: DC | PRN
  Administered 2014-01-08: 1000 mg via ORAL
  Filled 2014-01-08: qty 2

## 2014-01-08 MED ORDER — PRIMIDONE 50 MG PO TABS: 150.0000 mg | ORAL_TABLET | Freq: Every day | ORAL | Status: AC

## 2014-01-08 MED ORDER — PROPRANOLOL HCL ER 160 MG PO CP24
160.0000 mg | ORAL_CAPSULE | ORAL | Status: DC
  Administered 2014-01-08: 160 mg via ORAL
  Filled 2014-01-08: qty 1

## 2014-01-08 MED ORDER — PRIMIDONE 50 MG PO TABS
150.0000 mg | ORAL_TABLET | Freq: Every day | ORAL | Status: DC
  Administered 2014-01-08: 150 mg via ORAL
  Filled 2014-01-08: qty 3

## 2014-01-08 NOTE — Progress Notes (Signed)
1 Day Post-Op Procedure(s) (LRB): LAPAROSCOPIC BILATERAL SALPINGO OOPHORECTOMY (Bilateral)  Subjective: Pt doing really well.  Walking well.  Voiding well.  Pain well controlled.  Had regular diet last night.  No nausea.  No dizziness.  D/W pt decrease in renal function (GFR) since pre-op which is likely due to Toradol.  This has been stopped and motrin will be discontinued.  No vaginal bleeding.  Ambulating well.  Pt was seen last night.  Note not entered.  Due to family's concern about hypotension post operatively and prior experiences with early discharge, pt was kept overnight.  Blood pressures have steadily increased overnight and feel she should gradually restart BP medications.  Will begin Indural LA tody.    Objective: I have reviewed patient's vital signs, intake and output, medications and labs.  General: alert and cooperative Resp: clear to auscultation bilaterally Cardio: regular rate and rhythm, S1, S2 normal, no murmur, click, rub or gallop GI: soft, non-tender; bowel sounds normal; no masses,  no organomegaly and incision: clean, dry and intact Extremities: extremities normal, atraumatic, no cyanosis or edema Vaginal Bleeding: none  Assessment: s/p Procedure(s): LAPAROSCOPIC BILATERAL SALPINGO OOPHORECTOMY (Bilateral): progressing well  Plan: Begin BP medications this am.  If does well, plan D/C home.  LOS: 1 day    Hale Bogus [facility] 01/08/2014, 7:27 AM

## 2014-01-08 NOTE — Discharge Instructions (Signed)
Post-surgical Instructions, Outpatient Surgery  You may expect to feel dizzy, weak, and drowsy for as long as 24 hours after receiving the medicine that made you sleep (anesthetic). For the first 24 hours after your surgery:    Do not drive a car, ride a bicycle, participate in physical activities, or take public transportation until you are done taking narcotic pain medicines or as directed by Dr. Sabra Heck.   Do not drink alcohol or take tranquilizers.   Do not take medicine that has not been prescribed by your physicians.   Do not sign important papers or make important decisions while on narcotic pain medicines.   Have a responsible person with you.   CARE OF INCISION  If you have a bandage, you may remove it in one day.  If there are steri-strips or dermabond, just let this loosen on its own.   You may shower on the first day after your surgery.  Do not sit in a tub bath for one week.  Avoid heavy lifting (more than 10 pounds/4.5 kilograms), pushing, or pulling.   Avoid activities that may risk injury to your incisions.   Take off the incision at your belly button on Thursday morning.  PAIN MANAGEMENT  Tylenol 500mg  tablets (extra strength) every 6 hours OR take regular Tylenol alternating two and three tablets every six hours. Vicodin 5/300mg .  For more severe pain, take one or two tablets every four to six hours as needed for pain control.  YOU CAN CUT THIS IN 1/3 or 1/2 AT FIRST.  (Remember that narcotic pain medications increase your risk of constipation.  If this becomes a problem, you may take an over the counter stool softener like Colace 100mg  up to four times a day.)  DO'S AND DON'T'S  Do not take a tub bath for one week.  You may shower on the first day after your surgery  Do not do any heavy lifting for one to two weeks.  This increases the chance of bleeding.  Do move around as you feel able.  Stairs are fine.  You may begin to exercise again as you feel able.  Do  not lift any weights for two weeks.  Do not put anything in the vagina for two weeks--no tampons, intercourse, or douching.    REGULAR MEDIATIONS/VITAMINS:  DO NOT restart your Lozol or Diovan until your systolic BP is >683.  Start one at a time.  I will communicate with Dr. Elyse Hsu as well and make sure he doesn't have additional recommendations.  Wait until you are having normal bowel movements to restart all of your Vitamins.  Restart your aspirin in two to three days.    PLEASE CALL OR SEEK MEDICAL CARE IF:  You have persistent nausea and vomiting.   You have trouble eating or drinking.   You have an oral temperature above 100.5.   You have constipation that is not helped by adjusting diet or increasing fluid intake. Pain medicines are a common cause of constipation.   You have heavy vaginal bleeding  You have redness or drainage from your incision(s) or there is increasing pain or tenderness near or in the surgical site.

## 2014-01-08 NOTE — Discharge Summary (Signed)
Physician Discharge Summary  Patient ID: Michelle Fuller MRN: [id number] DOB/AGE: 77-Apr-1936 77 y.o.  Admit date: 01/07/2014 Discharge date: 01/08/2014  Admission Diagnoses: Left ovarian mass  Discharge Diagnoses:  Active Problems:   Nausea   Discharged Condition: good  Hospital Course: Patient admitted through same day surgery.  She was taken to OR where bilateral salpingoophrectomy was performed.  Surgical findings included enlarged left ovary with solid component consistent with fibroma.  Surgery was uneventful.  EBL 25cc.  Foley catheter was removed before leaving OR.  Patient transferred to PACU where she was stable, however she had dizziness and post operative nausea.  She has a history with two prior surgeries (both other providers) that were outpatient and required visits to the emergency room due to hypotension.  Decision made to place her in 23 hour observation so slow restarting of her antihypertensives could be done and for continued monitoring.  Her nausea resolved with a single 6.25 mg dose of phenergan.  Pt was able to eat regular diet and ambulate.  Due to family and patient discomfortand with discharge home, patient was kept overnight.  She was very stable overnight.  Post op CMP showed some decrease in renal function.  Toradol was stopped.  As patient's pain control was good, pt transitioned to oral tylenol only.  This worked well for her.  In the AM of POD#1, she was without complaint.  Indural was started and BPs were stable.  At this point, patient was voiding, walking, having excellent pain control, had no nausea, and no vaginal bleeding.  She was ready for D/C.  Consults: None  Significant Diagnostic Studies: labs: GFR 69  Treatments: surgery: laparoscopic BSO  Discharge Exam: Blood pressure 141/67, pulse 72, temperature 98.1 F (36.7 C), temperature source Oral, resp. rate 18, height 5\' 4"  (1.626 m), weight 145 lb (65.772 kg), last menstrual period 05/25/1999, SpO2  93.00%. General appearance: alert and cooperative Resp: clear to auscultation bilaterally Cardio: regular rate and rhythm, S1, S2 normal, no murmur, click, rub or gallop GI: soft, non-tender; bowel sounds normal; no masses,  no organomegaly Extremities: extremities normal, atraumatic, no cyanosis or edema Incision/Wound:c/d/i  Disposition: 01-Home or Self Care     Medication List    STOP taking these medications       ondansetron 4 MG tablet  Commonly known as:  ZOFRAN      TAKE these medications       aspirin 81 MG tablet  Take 1 tablet (81 mg total) by mouth daily.     atorvastatin 10 MG tablet  Commonly known as:  LIPITOR  Take 10 mg by mouth daily at 12 noon.     CITRACAL PETITES/VITAMIN D PO  Take 2 tablets by mouth 2 (two) times daily.     CULTURELLE PO  Take 1 capsule by mouth daily.     FINACEA 15 % cream  Generic drug:  Azelaic Acid     Hydrocodone-Acetaminophen 5-300 MG Tabs  Commonly known as:  VICODIN  Take 1 tablet by mouth every 6 (six) hours. May take two tablets if needed     indapamide 1.25 MG tablet  Commonly known as:  LOZOL  Take 1.25 mg by mouth every morning.     MULTIVITAMIN PO  Take by mouth. Centrum silver one  A day     primidone 50 MG tablet  Commonly known as:  MYSOLINE  Take 3 tablets (150 mg total) by mouth daily with breakfast.     primidone 50  MG tablet  Commonly known as:  MYSOLINE  Take 150 mg by mouth daily at 12 noon.     propranolol ER 160 MG SR capsule  Commonly known as:  INDERAL LA  Take by mouth daily.     SM FLAX SEED OIL 1000 MG Caps  Take by mouth.     valsartan 320 MG tablet  Commonly known as:  DIOVAN  Take 320 mg by mouth daily.         SignedLyman Speller 01/08/2014, 10:37 AM

## 2014-01-21 ENCOUNTER — Encounter: Payer: Self-pay | Admitting: Obstetrics & Gynecology

## 2014-01-21 ENCOUNTER — Ambulatory Visit (INDEPENDENT_AMBULATORY_CARE_PROVIDER_SITE_OTHER): Payer: Medicare Other | Admitting: Obstetrics & Gynecology

## 2014-01-21 VITALS — BP 132/78 | HR 60 | Ht 64.0 in | Wt 145.0 lb

## 2014-01-21 DIAGNOSIS — Z9889 Other specified postprocedural states: Secondary | ICD-10-CM

## 2014-01-21 NOTE — Progress Notes (Signed)
Post Operative Visit  Procedure:Laparpscopic Bilateral Salpingo Oopherectomy Days Post-op: 14 days  Subjective: Doing really well.  Never took anything for pain except Tylenol.  Back on all medications.  Reviewed pathology with patient.  Voiding without difficulty.  Hasn't started back on her vitamins.  Having normal bowel movements so advised she can restart.    Pt is going to [place].  Will be flying there.  She has a small carry-on.    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

## 2014-03-25 ENCOUNTER — Encounter: Payer: Self-pay | Admitting: Obstetrics & Gynecology

## 2014-04-10 ENCOUNTER — Encounter: Payer: Self-pay | Admitting: Neurology

## 2014-04-16 ENCOUNTER — Encounter: Payer: Self-pay | Admitting: Neurology

## 2014-07-16 ENCOUNTER — Telehealth: Payer: Self-pay

## 2014-07-16 NOTE — Telephone Encounter (Signed)
lmtcb-to inform patient that her MMG and BMD are scheduled for 08/16/14 at 10:00 at [facility].//kn

## 2014-07-16 NOTE — Telephone Encounter (Signed)
Patient notified of time and date.//kn

## 2014-07-18 ENCOUNTER — Telehealth: Payer: Self-pay | Admitting: Obstetrics & Gynecology

## 2014-07-18 NOTE — Telephone Encounter (Signed)
I have attempted to reach patient at [phone]. No answer. Need to inquire about when patient will be back in town and if there is a day and time that will work better for her.

## 2014-07-18 NOTE — Telephone Encounter (Signed)
Patient is calling regarding recent MMG and BMD scheduled at [facility]. Patient did receive the msg from [facility] with 08/16/14 appointment date.. Patient will be out of town and is asking for a new date. I told patient Claiborne Billings is out of the office today and she asked if another nurse would be able to take care of this for her.

## 2014-07-19 NOTE — Telephone Encounter (Signed)
Spoke with patient. Patient requests appointment for MMG and BMD be moved to 4/6. Advised will speak with [facility] to rearrange the appointment. Patient is agreeable. Spoke with Judeen Hammans at [facility] who states patient has appointment already scheduled for 4/6 at [facility] with patient to advised of appointment date and time. Patient is agreeable.  Routing to provider for final review. Patient agreeable to disposition. Will close encounter

## 2014-08-16 IMAGING — CR DG ABDOMEN 2V
2 series · 2 of 2 positions shown · non-contrast
Comparison: 03/30/2013

CLINICAL DATA: Abdominal pain.

EXAM:
ABDOMEN - 2 VIEW

[w abdomen upright]
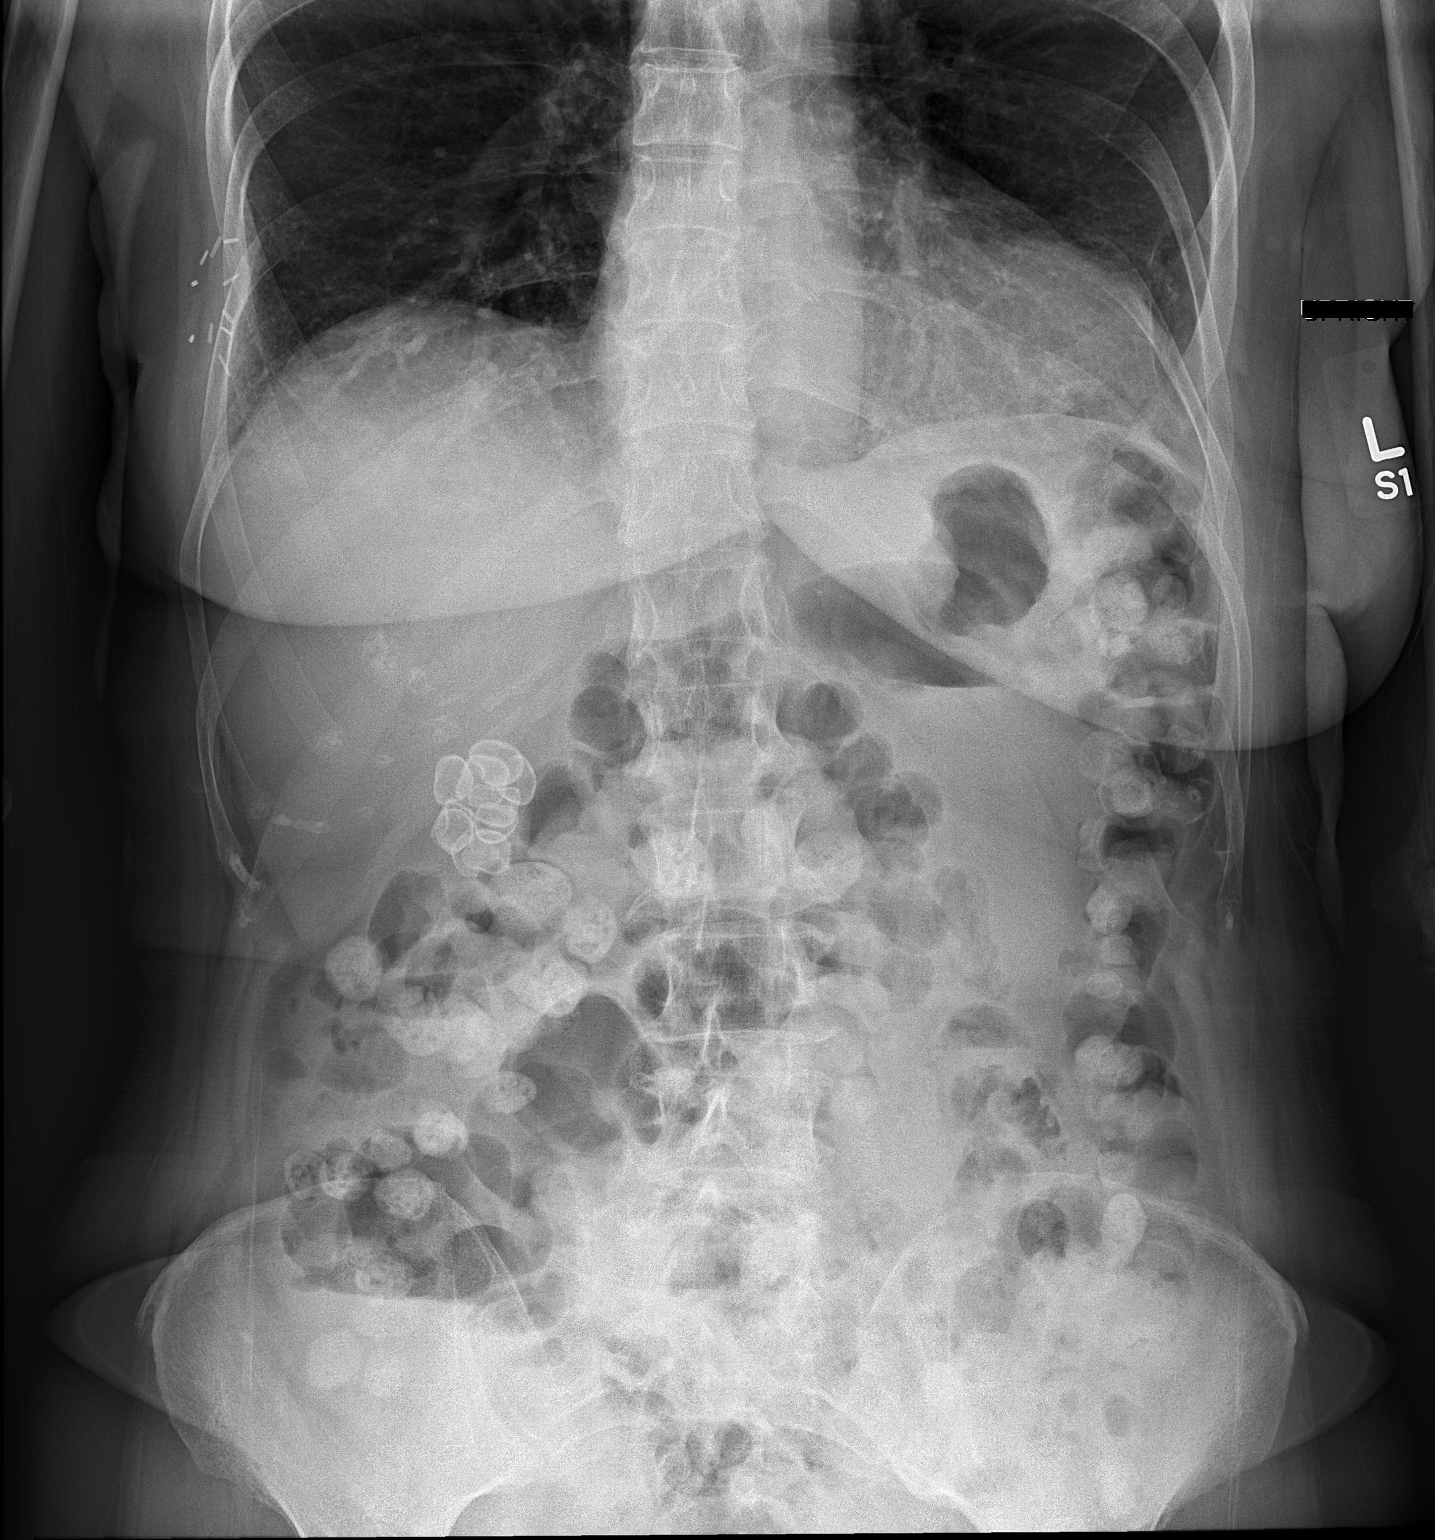

[t abdomen supine]
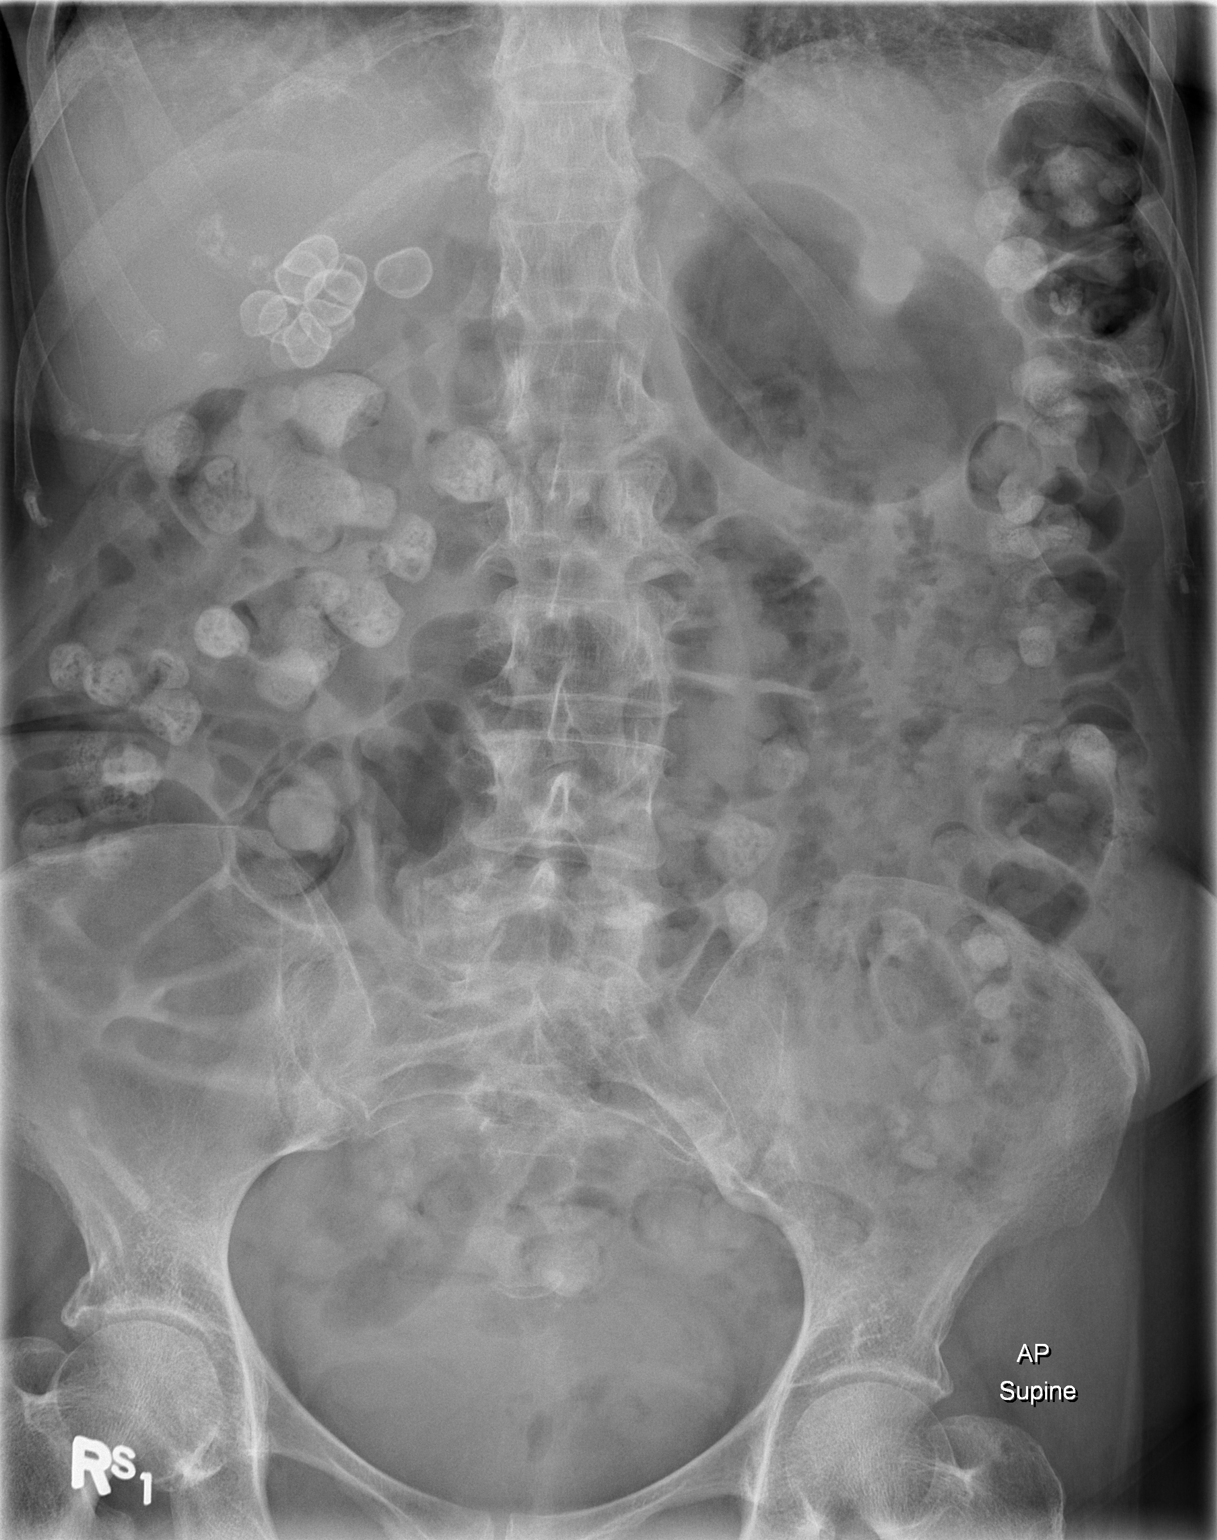

[2 of 2 positions shown; findings below may reference images not displayed]

FINDINGS: Calcified gallstones are noted within the gallbladder. Oral contrast
material noted throughout the colon filling numerous diffuse
diverticula. Nonobstructive bowel gas pattern. No free air. No
organomegaly. With the colonic oral contrast material it is
difficult to exclude small stones, but no suspicious calcification
definitively seen. Visualized lung bases are clear.
IMPRESSION: No evidence of bowel obstruction or free air.

Cholelithiasis.

Diffuse colonic diverticulosis.

## 2014-09-02 ENCOUNTER — Other Ambulatory Visit: Payer: Self-pay | Admitting: Endocrinology

## 2014-09-02 DIAGNOSIS — N281 Cyst of kidney, acquired: Secondary | ICD-10-CM

## 2014-09-04 ENCOUNTER — Ambulatory Visit
Admission: RE | Admit: 2014-09-04 | Discharge: 2014-09-04 | Disposition: A | Discharge status: Home / Self Care | Payer: PRIVATE HEALTH INSURANCE | Source: Ambulatory Visit | Attending: Endocrinology | Admitting: Endocrinology

## 2014-09-04 DIAGNOSIS — N281 Cyst of kidney, acquired: Secondary | ICD-10-CM

## 2014-09-06 ENCOUNTER — Telehealth: Payer: Self-pay

## 2014-09-06 NOTE — Telephone Encounter (Signed)
Patient notified of BMD results. Has AEX scheduled for 09/10/14 and would like to discuss it then. Holding copy of report to discuss and then will ba scanned to epic.//kn

## 2014-09-10 ENCOUNTER — Ambulatory Visit (INDEPENDENT_AMBULATORY_CARE_PROVIDER_SITE_OTHER): Payer: Medicare Other | Admitting: Obstetrics & Gynecology

## 2014-09-10 ENCOUNTER — Encounter: Payer: Self-pay | Admitting: Obstetrics & Gynecology

## 2014-09-10 VITALS — BP 160/82 | HR 64 | Resp 16 | Ht 64.0 in | Wt 148.6 lb

## 2014-09-10 DIAGNOSIS — Z0000 Encounter for general adult medical examination without abnormal findings: Secondary | ICD-10-CM

## 2014-09-10 DIAGNOSIS — Z124 Encounter for screening for malignant neoplasm of cervix: Secondary | ICD-10-CM

## 2014-09-10 DIAGNOSIS — Z01419 Encounter for gynecological examination (general) (routine) without abnormal findings: Secondary | ICD-10-CM

## 2014-09-10 LAB — POCT URINALYSIS DIPSTICK
Bilirubin, UA: NEGATIVE
Blood, UA: NEGATIVE
Glucose, UA: NEGATIVE
Ketones, UA: NEGATIVE
Leukocytes, UA: NEGATIVE
Nitrite, UA: NEGATIVE
PROTEIN UA: NEGATIVE
UROBILINOGEN UA: NEGATIVE
pH, UA: 7

## 2014-09-10 NOTE — Progress Notes (Signed)
80 y.o. G3P3 WidowedCaucasianF here for annual exam.  Doing well.  No vaginal bleeding.  Last visit with Dr. Elyse Hsu was in march.  Had follow up renal ultrasound that was stable.  Angiomyolipoma noted on left.  No additional follow up recommended.    D/W pt BMD resutls.  Osteoporosis in forearms and spine.  Has been on Evista in the past.  Declines additional treatment at this time.    Patient's last menstrual period was 05/25/1999.          Sexually active: No.  The current method of family planning is post menopausal status.    Exercising: Yes.    tai chi 2 x weekly, treadmill 30 mins/2 x weekly Smoker:  no  Health Maintenance: Pap:  01/25/08 WNL History of abnormal Pap:  Yes years ago MMG:  08/28/14 3D-normal Colonoscopy:  6/11-repeat in 5 years.  Declines doing another one.   BMD:   08/28/14-osteoporosis in wrist TDaP:  PCP Screening Labs: PCP, Hb today: PCP, Urine today: PH-7.0   reports that she has never smoked. She has never used smokeless tobacco. She reports that she does not drink alcohol or use illicit drugs.  Past Medical History  Diagnosis Date  . Hypertension   . Breast cancer     Right breast, status post XRT  . Hyperlipidemia   . Essential tremor   . Depression   . Diverticulosis   . Post-menopausal bleeding 01/2000    Huge polyps exc  . Sleep apnea, obstructive     mild  . Elevated LDL cholesterol level   . Osteopenia 8/97    Spine  . Syncope   . Goiter     multinodular  . Colon polyp   . Ovarian cyst     found on CT scan  . Cataracts, both eyes   . Chest pain 2015    recent episode while in sun- recovered in shade with water  . Diverticulitis     Past Surgical History  Procedure Laterality Date  . Breast lumpectomy Right 4/96  . Bilateral cateract surgery  2014  . Hysteroscopy  0/01  . Laparoscopic bilateral salpingo oopherectomy Bilateral 01/07/2014    Procedure: LAPAROSCOPIC BILATERAL SALPINGO OOPHORECTOMY;  Surgeon: Lyman Speller, MD;   Location: [facility];  Service: Gynecology;  Laterality: Bilateral;    Current Outpatient Prescriptions  Medication Sig Dispense Refill  . aspirin 81 MG tablet Take 1 tablet (81 mg total) by mouth daily. 30 tablet   . atorvastatin (LIPITOR) 10 MG tablet Take 10 mg by mouth daily at 12 noon.    . Calcium Citrate-Vitamin D (CITRACAL PETITES/VITAMIN D PO) Take 2 tablets by mouth 2 (two) times daily.    . clonazePAM (KLONOPIN) 0.5 MG tablet     . FINACEA 15 % cream     . Flaxseed, Linseed, (SM FLAX SEED OIL) 1000 MG CAPS Take by mouth.    . indapamide (LOZOL) 1.25 MG tablet Take 1.25 mg by mouth every morning.    . Lactobacillus Rhamnosus, GG, (CULTURELLE PO) Take 1 capsule by mouth daily.    . Multiple Vitamins-Minerals (MULTIVITAMIN PO) Take by mouth. Centrum silver one  A day    . primidone (MYSOLINE) 50 MG tablet Take 3 tablets (150 mg total) by mouth daily with breakfast.    . propranolol ER (INDERAL LA) 160 MG SR capsule Take by mouth daily.    . valsartan (DIOVAN) 320 MG tablet Take 320 mg by mouth daily.    . ciprofloxacin (  CIPRO) 250 MG tablet Finished antibiotics  3   No current facility-administered medications for this visit.    Family History  Problem Relation Age of Onset  . Heart disease Mother   . Alzheimer's disease Father   . Thyroid disease Mother   . Thyroid disease Maternal Grandmother     ROS:  Pertinent items are noted in HPI.  Otherwise, a comprehensive ROS was negative.  Exam:   BP 160/82 mmHg  Pulse 64  Resp 16  Ht _0  (1.626 m)  Wt 148 lb 9.6 oz (67.405 kg)  BMI 25.49 kg/m2  LMP 05/25/1999  Weight change: +6#   Height: _1  (162.6 cm)  Ht Readings from Last 3 Encounters:  09/10/14 _2  (1.626 m)  01/21/14 _3  (1.626 m)  01/07/14 _4  (1.626 m)    General appearance: alert, cooperative and appears stated age Head: Normocephalic, without obvious abnormality, atraumatic Neck: no adenopathy, supple, symmetrical, trachea midline and thyroid normal  to inspection and palpation Lungs: clear to auscultation bilaterally Breasts: normal appearance, no masses or tenderness Heart: regular rate and rhythm Abdomen: soft, non-tender; bowel sounds normal; no masses,  no organomegaly Extremities: extremities normal, atraumatic, no cyanosis or edema Skin: Skin color, texture, turgor normal. No rashes or lesions Lymph nodes: Cervical, supraclavicular, and axillary nodes normal. No abnormal inguinal nodes palpated Neurologic: Grossly normal   Pelvic: External genitalia:  no lesions              Urethra:  normal appearing urethra with no masses, tenderness or lesions              Bartholins and Skenes: normal                 Vagina: normal appearing vagina with normal color and discharge, no lesions              Cervix: no lesions              Pap taken: Yes.   Bimanual Exam:  Uterus:  normal size, contour, position, consistency, mobility, non-tender              Adnexa: normal adnexa and no mass, fullness, tenderness               Rectovaginal: Confirms               Anus:  normal sphincter tone, no lesions  Chaperone was present for exam.  A:  Well Woman with normal exam PMP, no HRT H/o multinodular goiter. Followed by Dr. Elyse Hsu H/O breast cancer 4/96 Elevated lipids Hypertension H/O colonic polyps Osteopenia.  Decines additional treatment. Tremor H/O ovarian cyst, s/p lap BSO 8/15 H/O recurrent diverticulitis.  Sees Dr. Fuller Plan.  P: Mammogram yearly pap smear obtained this year  return annually or prn

## 2014-09-11 LAB — IPS PAP SMEAR ONLY

## 2014-09-27 ENCOUNTER — Encounter: Payer: Self-pay | Admitting: Gastroenterology

## 2015-01-22 ENCOUNTER — Encounter: Payer: Self-pay | Admitting: Neurology

## 2015-01-22 ENCOUNTER — Ambulatory Visit (INDEPENDENT_AMBULATORY_CARE_PROVIDER_SITE_OTHER): Payer: Medicare Other | Admitting: Neurology

## 2015-01-22 VITALS — BP 124/86 | HR 78 | Resp 16 | Ht 64.0 in | Wt 150.0 lb

## 2015-01-22 DIAGNOSIS — G250 Essential tremor: Secondary | ICD-10-CM | POA: Diagnosis not present

## 2015-01-22 DIAGNOSIS — G4719 Other hypersomnia: Secondary | ICD-10-CM

## 2015-01-22 DIAGNOSIS — R0683 Snoring: Secondary | ICD-10-CM

## 2015-01-22 DIAGNOSIS — R351 Nocturia: Secondary | ICD-10-CM | POA: Diagnosis not present

## 2015-01-22 DIAGNOSIS — G2581 Restless legs syndrome: Secondary | ICD-10-CM | POA: Diagnosis not present

## 2015-01-22 DIAGNOSIS — R002 Palpitations: Secondary | ICD-10-CM

## 2015-01-22 NOTE — Progress Notes (Signed)
Subjective:    Patient ID: Michelle Fuller is a 80 y.o. female.  HPI     Star Age, MD, PhD [facility] [address], [address], [place] [address]  Dear Dr. Elyse Hsu,   I saw your patient, Michelle Fuller, upon your kind request in my neurologic clinic today for initial consultation of her sleep disorder, in particular concern for underlying obstructive sleep apnea. The patient is unaccompanied today. As you know, Ms. Michelle Fuller is a 80 year old right-handed lady with an underlying medical history of hypertension, vitamin D deficiency, breast cancer, status post right-sided lumpectomy in April 1996, and XRT, hyperlipidemia, tremor (seen by Dr. Jannifer Franklin in 9/14 for ET), depression, diverticulosis, osteopenia, goiter, syncope, hyperlipidemia, who reports snoring, non-restorative sleep, nocturia, and EDS.  I reviewed your office note from 11/13/2014, which you kindly included. She had blood work on 08/13/2014 through your office: CMP was unremarkable with the exception of borderline chloride at 96, total cholesterol 182, triglycerides 158, LDL 102, free T4 1 0.27, TSH 2.37, vitamin D 39.5. She reports a bedtime of around 10 PM and she does not typically have difficulty falling asleep. However, she has multiple nighttime awakenings. She has nocturia once or twice on an average night. She may turn on the TV at night and sometimes in the middle of the night which is activating. She has a rise time of around 7. She is usually awake around 6. She does not feel well rested when she wakes up. Her Epworth sleepiness score is 11 out of 24 and her fatigue score is 31 out of 63. She has no family history of obstructive sleep apnea. She has occasional restless leg symptoms. She lives alone. She has 3 grown children. Her oldest son has tremors. She denies morning headaches. She is tired during the day and while she does not plan any naps often falls asleep in the  afternoons when sedentary. She drinks 1 glass of green tea per day. She has reduced her caffeine intake secondary to palpitations. A couple of times she woke up with palpitations and a sense of a bad dream in the early morning hours. She quit drinking alcohol altogether in 2015 after her bout of diverticulitis. She does not smoke. She takes clonazepam 0.5 mg often in the middle of the night around 1 AM.   Her Past Medical History Is Significant For: Past Medical History  Diagnosis Date  . Hypertension   . Breast cancer     Right breast, status post XRT  . Hyperlipidemia   . Essential tremor   . Depression   . Diverticulosis   . Post-menopausal bleeding 01/2000    Huge polyps exc  . Sleep apnea, obstructive     mild  . Elevated LDL cholesterol level   . Osteopenia 8/97    Spine  . Syncope   . Goiter     multinodular  . Colon polyp   . Ovarian cyst     found on CT scan  . Cataracts, both eyes   . Chest pain 2015    recent episode while in sun- recovered in shade with water  . Diverticulitis   . Vitamin D deficiency     Her Past Surgical History Is Significant For: Past Surgical History  Procedure Laterality Date  . Breast lumpectomy Right 4/96  . Bilateral cateract surgery  2014  . Hysteroscopy  0/01  . Laparoscopic bilateral salpingo oopherectomy Bilateral 01/07/2014    Procedure: LAPAROSCOPIC BILATERAL SALPINGO OOPHORECTOMY;  Surgeon: Lyman Speller, MD;  Location: [facility];  Service: Gynecology;  Laterality: Bilateral;    Her Family History Is Significant For: Family History  Problem Relation Age of Onset  . Heart disease Mother   . Thyroid disease Mother   . Heart attack Mother   . Alzheimer's disease Father   . Thyroid disease Maternal Grandmother   . Stroke Maternal Grandmother     Her Social History Is Significant For: Social History   Social History  . Marital Status: Widowed    Spouse Name: N/A  . Number of Children: 3  . Years of Education: BA    Occupational History  . Retired Pharmacist, hospital    Social History Main Topics  . Smoking status: Never Smoker   . Smokeless tobacco: Never Used  . Alcohol Use: No  . Drug Use: No  . Sexual Activity: No   Other Topics Concern  . None   Social History Narrative   Widowed.  Lives alone.  Ambulates without assistance.   Drinks about 1 cup of green tea a day     Her Allergies Are:  Allergies  Allergen Reactions  . Latex Itching and Rash  . Flagyl [Metronidazole] Hives    Break out  . Penicillins Hives and Rash    REACTION: "broke out"  :   Her Current Medications Are:  Outpatient Encounter Prescriptions as of 01/22/2015  Medication Sig  . aspirin 81 MG tablet Take 1 tablet (81 mg total) by mouth daily.  Marland Kitchen atorvastatin (LIPITOR) 10 MG tablet Take 10 mg by mouth daily at 12 noon.  . Calcium Citrate-Vitamin D (CITRACAL PETITES/VITAMIN D PO) Take 2 tablets by mouth 2 (two) times daily.  . ciprofloxacin (CIPRO) 250 MG tablet Finished antibiotics  . clonazePAM (KLONOPIN) 0.5 MG tablet   . FINACEA 15 % cream   . Flaxseed, Linseed, (SM FLAX SEED OIL) 1000 MG CAPS Take by mouth.  . indapamide (LOZOL) 1.25 MG tablet Take 1.25 mg by mouth every morning.  . Lactobacillus Rhamnosus, GG, (CULTURELLE PO) Take 1 capsule by mouth daily.  . Multiple Vitamins-Minerals (MULTIVITAMIN PO) Take by mouth. Centrum silver one  A day  . primidone (MYSOLINE) 50 MG tablet Take 3 tablets (150 mg total) by mouth daily with breakfast.  . propranolol ER (INDERAL LA) 160 MG SR capsule Take by mouth daily.  . valsartan (DIOVAN) 320 MG tablet Take 320 mg by mouth daily.   No facility-administered encounter medications on file as of 01/22/2015.  :  Review of Systems:  Out of a complete 14 point review of systems, all are reviewed and negative with the exception of these symptoms as listed below:   Review of Systems  Constitutional: Positive for fatigue.  Neurological: Positive for tremors.       No trouble  falling asleep, trouble staying asleep, snoring, couple of times woke up feeling panicked and heart was racing, feels an overwhelming urge to go to sleep after breakfast, daytime tiredness, patient falls asleep when sitting still.    Psychiatric/Behavioral:       Not enough sleep, decreased energy, disinterest in activities. Patient is being followed for depression, triggers come from family issues.     Objective:  Neurologic Exam  Physical Exam Physical Examination:   Filed Vitals:   01/22/15 1019  BP: 124/86  Pulse: 78  Resp: 16    General Examination: The patient is a very pleasant 80 y.o. female in no acute distress. She appears well-developed and well-nourished and  very well groomed.   HEENT: Normocephalic, atraumatic, pupils are equal, round and reactive to light and accommodation. Funduscopic exam is normal with sharp disc margins noted. Extraocular tracking is good without limitation to gaze excursion or nystagmus noted. Normal smooth pursuit is noted. Hearing is grossly intact. Tympanic membranes are clear bilaterally. Face is symmetric with normal facial animation and normal facial sensation. Speech is clear with no dysarthria noted. There is no hypophonia. There is a consistent head and mild voice tremor. Neck is supple with full range of passive and active motion. There are no carotid bruits on auscultation. Oropharynx exam reveals: mild mouth dryness, adequate dental hygiene and moderate airway crowding, due to redundant soft palate, large uvula, small airway entry. Mallampati is class II. Tongue protrudes centrally and palate elevates symmetrically. Tonsils are small bilaterally. Neck circumference is 15-1/4 inches.  Chest: Clear to auscultation without wheezing, rhonchi or crackles noted.  Heart: S1+S2+0, regular and normal without murmurs, rubs or gallops noted.   Abdomen: Soft, non-tender and non-distended with normal bowel sounds appreciated on  auscultation.  Extremities: There is no pitting edema in the distal lower extremities bilaterally. Pedal pulses are intact.  Skin: Warm and dry without trophic changes noted. There are no varicose veins.  Musculoskeletal: exam reveals no obvious joint deformities, tenderness or joint swelling or erythema, but she wears an elastic sleeve on the RUE.   Neurologically:  Mental status: The patient is awake, alert and oriented in all 4 spheres. Her immediate and remote memory, attention, language skills and fund of knowledge are appropriate. There is no evidence of aphasia, agnosia, apraxia or anomia. Speech is clear with normal prosody and enunciation. Thought process is linear. Mood is normal and affect is normal.  Cranial nerves II - XII are as described above under HEENT exam. In addition: shoulder shrug is normal with equal shoulder height noted. Motor exam: Normal bulk, strength and tone is noted. There is no drift, or rebound. She has a mild bilateral upper extremity postural and action tremor. She has a tremulous handwriting which is overall fairly legible. Romberg is negative. Reflexes are 2+ throughout. Babinski: Toes are flexor bilaterally. Fine motor skills and coordination: intact with normal finger taps, normal hand movements, normal rapid alternating patting, normal foot taps and normal foot agility.  Cerebellar testing: No dysmetria or intention tremor on finger to nose testing. Heel to shin is unremarkable bilaterally. There is no truncal or gait ataxia.  Sensory exam: intact to light touch, pinprick, vibration, temperature sense in the upper and lower extremities.  Gait, station and balance: She stands easily. No veering to one side is noted. No leaning to one side is noted. Posture is age-appropriate and stance is narrow based. Gait shows normal stride length and normal pace. No problems turning are noted. She turns en bloc. Tandem walk is slightly difficult for her.   Assessment and  Plan:   In summary, LAKAISHA DANISH is a very pleasant 80 y.o.-year old female with an underlying medical history of hypertension, vitamin D deficiency, breast cancer, status post right-sided lumpectomy in April 1996, and XRT, hyperlipidemia, tremor (seen by Dr. Jannifer Franklin in 9/14 for ET), depression, diverticulosis, osteopenia, goiter, syncope, hyperlipidemia, who reports snoring, non-restorative sleep, nocturia, and EDS. Her history and physical exam are indeed concerning for underlying obstructive sleep apnea (OSA). I had a long chat with the patient about my findings and the diagnosis of OSA, its prognosis and treatment options. We talked about medical treatments, surgical interventions and non-pharmacological  approaches. I explained in particular the risks and ramifications of untreated moderate to severe OSA, especially with respect to developing cardiovascular disease down the Road, including congestive heart failure, difficult to treat hypertension, cardiac arrhythmias, or stroke. Even type 2 diabetes has, in part, been linked to untreated OSA. Symptoms of untreated OSA include daytime sleepiness, memory problems, mood irritability and mood disorder such as depression and anxiety, lack of energy, as well as recurrent headaches, especially morning headaches. We talked about trying to maintain a healthy lifestyle in general, as well as the importance of weight control. I encouraged the patient to eat healthy, exercise daily and keep well hydrated, to keep a scheduled bedtime and wake time routine, to not skip any meals and eat healthy snacks in between meals. I advised the patient not to drive when feeling sleepy. I recommended the following at this time: sleep study with potential positive airway pressure titration. (We will score hypopneas at 4% and split the sleep study into diagnostic and treatment portion, if the estimated. 2 hour AHI is >20/h).   I explained the sleep test procedure to the patient  and also outlined possible surgical and non-surgical treatment options of OSA, including the use of a custom-made dental device (which would require a referral to a specialist dentist or oral surgeon), upper airway surgical options, such as pillar implants, radiofrequency surgery, tongue base surgery, and UPPP (which would involve a referral to an ENT surgeon). Rarely, jaw surgery such as mandibular advancement may be considered.  I also explained the CPAP treatment option to the patient, who indicated that she would be willing to try CPAP if the need arises. I explained the importance of being compliant with PAP treatment, not only for insurance purposes but primarily to improve Her symptoms, and for the patient's long term health benefit, including to reduce Her cardiovascular risks. I answered all her questions today and the patient was in agreement. I would like to see her back after the sleep study is completed and encouraged her to call with any interim questions, concerns, problems or updates.   Thank you very much for allowing me to participate in the care of this nice patient. If I can be of any further assistance to you please do not hesitate to call me at [phone].  Sincerely,   Star Age, MD, PhD

## 2015-01-22 NOTE — Patient Instructions (Signed)

## 2015-02-12 ENCOUNTER — Ambulatory Visit (INDEPENDENT_AMBULATORY_CARE_PROVIDER_SITE_OTHER): Payer: Medicare Other | Admitting: Neurology

## 2015-02-12 DIAGNOSIS — G4734 Idiopathic sleep related nonobstructive alveolar hypoventilation: Secondary | ICD-10-CM

## 2015-02-12 DIAGNOSIS — G4720 Circadian rhythm sleep disorder, unspecified type: Secondary | ICD-10-CM

## 2015-02-12 DIAGNOSIS — G4761 Periodic limb movement disorder: Secondary | ICD-10-CM

## 2015-02-12 DIAGNOSIS — G4733 Obstructive sleep apnea (adult) (pediatric): Secondary | ICD-10-CM

## 2015-02-12 DIAGNOSIS — G479 Sleep disorder, unspecified: Secondary | ICD-10-CM

## 2015-02-13 NOTE — Sleep Study (Signed)
scanned

## 2015-02-17 ENCOUNTER — Telehealth: Payer: Self-pay | Admitting: Neurology

## 2015-02-17 NOTE — Telephone Encounter (Signed)
Patient referred by Dr. Elyse Hsu, seen by me on 01/22/15:  Please call and notify the patient that the recent sleep study did not show any significant obstructive sleep apnea, but she did not sleep well and had a lot of leg movements/twitching in sleep, which seemed to disrupt sleep. Please inform patient that I would like to go over the details of the study during a follow up appointment and if not already previously scheduled, arrange a followup appointment (please utilize a followu-up slot). Also, route or fax report to PCP and referring MD, if other than PCP.  Once you have spoken to patient, you can close this encounter.   Thanks,  Star Age, MD, PhD [facility])

## 2015-02-18 NOTE — Telephone Encounter (Signed)
I spoke to patient and she is aware of results. We were able to make an appt for her in October. I will fax report to PCP.

## 2015-03-11 ENCOUNTER — Encounter: Payer: Self-pay | Admitting: Neurology

## 2015-03-11 ENCOUNTER — Ambulatory Visit (INDEPENDENT_AMBULATORY_CARE_PROVIDER_SITE_OTHER): Payer: Medicare Other | Admitting: Neurology

## 2015-03-11 VITALS — BP 122/68 | HR 78 | Resp 16 | Ht 64.0 in | Wt 151.0 lb

## 2015-03-11 DIAGNOSIS — G479 Sleep disorder, unspecified: Secondary | ICD-10-CM

## 2015-03-11 DIAGNOSIS — G4761 Periodic limb movement disorder: Secondary | ICD-10-CM | POA: Diagnosis not present

## 2015-03-11 DIAGNOSIS — G250 Essential tremor: Secondary | ICD-10-CM | POA: Diagnosis not present

## 2015-03-11 DIAGNOSIS — G4734 Idiopathic sleep related nonobstructive alveolar hypoventilation: Secondary | ICD-10-CM

## 2015-03-11 NOTE — Patient Instructions (Signed)
Your recent sleep study did not show any significant obstructive sleep apnea. Your oxygen level at baseline during sleep was somewhat lower around 90%. Please talk to Dr. Eden Emms about potentially seeing a lung specialist for perhaps an underlying lung disease. You did not have any critical desaturations. Your sleep study showed a lot of leg twitching. You do indicate that you had leg pain at the time. If you develop significant restless leg symptoms I would be happy to see you back and we can think about trying something to calm down leg movements and restless leg symptoms. At this moment, I suggested an as needed follow-up. Please try to stay active physically and mentally and try to keep good sleep hygiene, which means: Keep a regular sleep and wake schedule, try not to exercise or have a meal within 2 hours of your bedtime, try to keep your bedroom conducive for sleep, that is, cool and dark, without light distractors such as an illuminated alarm clock, and refrain from watching TV right before sleep or in the middle of the night and do not keep the TV or radio on during the night. Also, try not to use or play on electronic devices at bedtime, such as your cell phone, tablet PC or laptop. If you like to read at bedtime on an electronic device, try to dim the background light as much as possible. Do not eat in the middle of the night.

## 2015-03-11 NOTE — Progress Notes (Signed)
Subjective:    Patient ID: Michelle Fuller is a 80 y.o. female.  HPI     Interim history:  Michelle Fuller is a 80 year old right-handed lady with an underlying medical history of hypertension, vitamin D deficiency, breast cancer, status post right-sided lumpectomy in April 1996, and XRT, hyperlipidemia, tremor (seen by Dr. Jannifer Franklin in 9/14 for ET), depression, diverticulosis, osteopenia, goiter, syncope, hyperlipidemia, who presents for follow-up consultation of her sleep disorder, after her recent sleep study. The patient is unaccompanied today. I first met her on 01/22/2015 at the request of her primary care physician, at which time she reported snoring, nocturia, excessive daytime somnolence and not waking up rested. I invited her back for sleep study. She had a baseline sleep study on 02/12/2015 and underwent over her test results with her in detail today. Sleep efficiency was markedly reduced at 56.2% for which a prolonged sleep latency of 154 minutes, and wake after sleep onset was 13 minutes. Of note, the patient took clonazepam around 11:05 PM and lights out was at 10:49 PM. She had a normal arousal index. She had an increased percentage of stage II sleep, absence of slow-wave sleep, and a decreased percentage of REM sleep at 7.5% with a prolonged REM latency of 208.5 minutes. She had severe PLMS with an index of 122.5 per hour, resulting in 7.6 arousals per hour. She had mild to moderate snoring. Total AHI was 3.9 per hour, rising to 41.3 per hour during REM sleep and 8.9 per hour in the supine position, average oxygen saturation was 90% only, nadir was 86%, time below 88% saturation was 11 minutes.   Today, 03/11/2015: She reports feeling stable. She does recall that she did not have a good night at the time of a sleep study. She remembers that she had radiating back pain and some groin pain on the left side which started a couple of days before the sleep study and that she was in discomfort  during the sleep study. She does have intermittent mild restless leg symptoms but nothing that bothers her to any sinister degree. She takes clonazepam off and on at night to help relax. She has had intermittent depressive symptoms but decided not to start an antidepressant as suggested by her primary care physician in one time. She reports having had a recent vacation with her family and slept very well. Sometimes he watches TV in bed. She has no history of chronic lung disease, smoking, bronchitis, recurrent pneumonia, but had XRT.  Previously:  01/22/2015: She reports snoring, non-restorative sleep, nocturia, and EDS.   I reviewed your office note from 11/13/2014, which you kindly included. She had blood work on 08/13/2014 through your office: CMP was unremarkable with the exception of borderline chloride at 96, total cholesterol 182, triglycerides 158, LDL 102, free T4 1 0.27, TSH 2.37, vitamin D 39.5. She reports a bedtime of around 10 PM and she does not typically have difficulty falling asleep. However, she has multiple nighttime awakenings. She has nocturia once or twice on an average night. She may turn on the TV at night and sometimes in the middle of the night which is activating. She has a rise time of around 7. She is usually awake around 6. She does not feel well rested when she wakes up. Her Epworth sleepiness score is 11 out of 24 and her fatigue score is 31 out of 63. She has no family history of obstructive sleep apnea. She has occasional restless leg symptoms. She lives alone.  She has 3 grown children. Her oldest son has tremors. She denies morning headaches. She is tired during the day and while she does not plan any naps often falls asleep in the afternoons when sedentary. She drinks 1 glass of green tea per day. She has reduced her caffeine intake secondary to palpitations. A couple of times she woke up with palpitations and a sense of a bad dream in the early morning hours. She quit  drinking alcohol altogether in 2015 after her bout of diverticulitis. She does not smoke. She takes clonazepam 0.5 mg often in the middle of the night around 1 AM.  Her Past Medical History Is Significant For: Past Medical History  Diagnosis Date  . Hypertension   . Breast cancer ([place])     Right breast, status post XRT  . Hyperlipidemia   . Essential tremor   . Depression   . Diverticulosis   . Post-menopausal bleeding 01/2000    Huge polyps exc  . Sleep apnea, obstructive     mild  . Elevated LDL cholesterol level   . Osteopenia 8/97    Spine  . Syncope   . Goiter     multinodular  . Colon polyp   . Ovarian cyst     found on CT scan  . Cataracts, both eyes   . Chest pain 2015    recent episode while in sun- recovered in shade with water  . Diverticulitis   . Vitamin D deficiency     Her Past Surgical History Is Significant For: Past Surgical History  Procedure Laterality Date  . Breast lumpectomy Right 4/96  . Bilateral cateract surgery  2014  . Hysteroscopy  0/01  . Laparoscopic bilateral salpingo oopherectomy Bilateral 01/07/2014    Procedure: LAPAROSCOPIC BILATERAL SALPINGO OOPHORECTOMY;  Surgeon: Lyman Speller, MD;  Location: [facility];  Service: Gynecology;  Laterality: Bilateral;    Her Family History Is Significant For: Family History  Problem Relation Age of Onset  . Heart disease Mother   . Thyroid disease Mother   . Heart attack Mother   . Alzheimer's disease Father   . Thyroid disease Maternal Grandmother   . Stroke Maternal Grandmother     Her Social History Is Significant For: Social History   Social History  . Marital Status: Widowed    Spouse Name: N/A  . Number of Children: 3  . Years of Education: BA   Occupational History  . Retired Pharmacist, hospital    Social History Main Topics  . Smoking status: Never Smoker   . Smokeless tobacco: Never Used  . Alcohol Use: No  . Drug Use: No  . Sexual Activity: No   Other Topics Concern  . None    Social History Narrative   Widowed.  Lives alone.  Ambulates without assistance.   Drinks about 1 cup of green tea a day     Her Allergies Are:  Allergies  Allergen Reactions  . Latex Itching and Rash  . Flagyl [Metronidazole] Hives    Break out  . Penicillins Hives and Rash    REACTION: "broke out"  :   Her Current Medications Are:  Outpatient Encounter Prescriptions as of 03/11/2015  Medication Sig  . aspirin 81 MG tablet Take 1 tablet (81 mg total) by mouth daily.  Marland Kitchen atorvastatin (LIPITOR) 10 MG tablet Take 10 mg by mouth daily at 12 noon.  . Calcium Citrate-Vitamin D (CITRACAL PETITES/VITAMIN D PO) Take 2 tablets by mouth 2 (two) times daily.  Marland Kitchen  ciprofloxacin (CIPRO) 250 MG tablet Finished antibiotics  . clonazePAM (KLONOPIN) 0.5 MG tablet   . FINACEA 15 % cream   . Flaxseed, Linseed, (SM FLAX SEED OIL) 1000 MG CAPS Take by mouth.  . indapamide (LOZOL) 1.25 MG tablet Take 1.25 mg by mouth every morning.  . Lactobacillus Rhamnosus, GG, (CULTURELLE PO) Take 1 capsule by mouth daily.  . Multiple Vitamins-Minerals (MULTIVITAMIN PO) Take by mouth. Centrum silver one  A day  . primidone (MYSOLINE) 50 MG tablet Take 3 tablets (150 mg total) by mouth daily with breakfast.  . propranolol ER (INDERAL LA) 160 MG SR capsule Take by mouth daily.  . valsartan (DIOVAN) 320 MG tablet Take 320 mg by mouth daily.   No facility-administered encounter medications on file as of 03/11/2015.  :  Review of Systems:  Out of a complete 14 point review of systems, all are reviewed and negative with the exception of these symptoms as listed below:   Review of Systems  Neurological:       Patient is here to discuss sleep study, no new concerns reported.     Objective:  Neurologic Exam  Physical Exam Physical Examination:   Filed Vitals:   03/11/15 1604  BP: 122/68  Pulse: 78  Resp: 16    General Examination: The patient is a very pleasant 80 y.o. female in no acute distress. She  appears well-developed and well-nourished and very well groomed. She is in good spirits today.   HEENT: Normocephalic, atraumatic, pupils are equal, round and reactive to light and accommodation. Extraocular tracking is good without limitation to gaze excursion or nystagmus noted. Normal smooth pursuit is noted. Hearing is grossly intact. Face is symmetric with normal facial animation and normal facial sensation. Speech is clear with no dysarthria noted. There is no hypophonia. There is a consistent head and mild voice tremor. Neck is supple with full range of passive and active motion. There are no carotid bruits on auscultation. Oropharynx exam reveals: mild mouth dryness, adequate dental hygiene and moderate airway crowding, due to redundant soft palate, large uvula, small airway entry. Mallampati is class II. Tongue protrudes centrally and palate elevates symmetrically. Tonsils are small bilaterally.   Chest: Clear to auscultation without wheezing, rhonchi or crackles noted.  Heart: S1+S2+0, regular and normal without murmurs, rubs or gallops noted.   Abdomen: Soft, non-tender and non-distended with normal bowel sounds appreciated on auscultation.  Extremities: There is no pitting edema in the distal lower extremities bilaterally. Pedal pulses are intact.  Skin: Warm and dry without trophic changes noted. There are no varicose veins.  Musculoskeletal: exam reveals no obvious joint deformities, tenderness or joint swelling or erythema, but she wears an elastic sleeve on the RUE.   Neurologically:  Mental status: The patient is awake, alert and oriented in all 4 spheres. Her immediate and remote memory, attention, language skills and fund of knowledge are appropriate. There is no evidence of aphasia, agnosia, apraxia or anomia. Speech is clear with normal prosody and enunciation. Thought process is linear. Mood is normal and affect is normal.  Cranial nerves II - XII are as described above under  HEENT exam. In addition: shoulder shrug is normal with equal shoulder height noted. Motor exam: Normal bulk, strength and tone is noted. There is no drift, or rebound. She has a mild bilateral upper extremity postural and action tremor. Romberg is negative. Reflexes are 2+ throughout. Fine motor skills and coordination: intact with normal finger taps, normal hand movements, normal rapid  alternating patting, normal foot taps and normal foot agility.  Cerebellar testing: No dysmetria or intention tremor on finger to nose testing. Heel to shin is unremarkable bilaterally. There is no truncal or gait ataxia.  Sensory exam: intact to light touch in the upper and lower extremities.  Gait, station and balance: She stands easily. No veering to one side is noted. No leaning to one side is noted. Posture is age-appropriate and stance is narrow based. Gait shows normal stride length and normal pace. No problems turning are noted. She turns en bloc. Tandem walk is slightly difficult for her, unchanged.   Assessment and Plan:   In summary, Michelle Fuller is a very pleasant 80 year old female with an underlying medical history of hypertension, vitamin D deficiency, breast cancer, status post right-sided lumpectomy in April 1996, and XRT, hyperlipidemia, tremor (seen by Dr. Jannifer Franklin in 9/14 for ET), depression, diverticulosis, osteopenia, goiter, syncope, hyperlipidemia, who presents for follow-up consultation of her sleep disturbance, after her recent baseline sleep study in September 2016. We talked about the test results in detail today. She had a difficult time falling asleep at the time. She reports that she had some radiating back pain at the time which improved with rest. This had started a couple of days before her sleep study and she does recall that she had discomfort in her left leg throughout the night. This may explain the significant leg twitching during sleep. She does have intermittent mild restless leg  symptoms but nothing sustained and bothersome at this time. We will monitor. She did not have any significant degree of obstructive sleep apnea and had evidence of mild sleep disordered breathing during supine REM sleep. For this, she is advised to try to sleep off her back. She is encouraged to try to lose some weight to help with her snoring. She had lower baseline oxygen saturation was while asleep, no critical desaturations. She has no history of smoking or chronic lung disease. We can monitor. She can run this by her primary care physician as well, as far as may be seen a pulmonologist down the [place]. We talked about sleep hygiene as well today. She has had intermittent depression, but she feels this was not enough to use an antidepressant which was suggested to her by her primary care physician at one time. In fact, she has a bottle of an antidepressant of which the name she does not recall at home. She uses clonazepam as needed, she does not overuse it. At this juncture, I suggested I see her back on an as-needed basis. Her exam is stable. I answered all her questions today and she was in agreement.  I spent 25 minutes in total face-to-face time with the patient, more than 50% of which was spent in counseling and coordination of care, reviewing test results, reviewing medication and discussing or reviewing the diagnosis of PLMD, RLS, sleep disturbance, the prognosis and treatment options.

## 2015-04-20 ENCOUNTER — Encounter (HOSPITAL_COMMUNITY): Payer: Self-pay | Admitting: Emergency Medicine

## 2015-04-20 ENCOUNTER — Encounter (HOSPITAL_COMMUNITY): Payer: Self-pay

## 2015-04-20 ENCOUNTER — Observation Stay (EMERGENCY_DEPARTMENT_HOSPITAL)
Admission: EM | Admit: 2015-04-20 | Discharge: 2015-04-21 | Disposition: A | Discharge status: Home / Self Care | Payer: Medicare Other | Source: Home / Self Care | Attending: Emergency Medicine | Admitting: Emergency Medicine

## 2015-04-20 ENCOUNTER — Emergency Department (HOSPITAL_COMMUNITY)
Admission: EM | Admit: 2015-04-20 | Discharge: 2015-04-20 | Disposition: A | Discharge status: Home / Self Care | Payer: Medicare Other | Attending: Emergency Medicine | Admitting: Emergency Medicine

## 2015-04-20 ENCOUNTER — Emergency Department (HOSPITAL_COMMUNITY): Payer: Medicare Other

## 2015-04-20 DIAGNOSIS — I441 Atrioventricular block, second degree: Secondary | ICD-10-CM | POA: Diagnosis not present

## 2015-04-20 DIAGNOSIS — I10 Essential (primary) hypertension: Secondary | ICD-10-CM | POA: Diagnosis not present

## 2015-04-20 DIAGNOSIS — E559 Vitamin D deficiency, unspecified: Secondary | ICD-10-CM | POA: Insufficient documentation

## 2015-04-20 DIAGNOSIS — R079 Chest pain, unspecified: Secondary | ICD-10-CM | POA: Diagnosis not present

## 2015-04-20 DIAGNOSIS — R0602 Shortness of breath: Secondary | ICD-10-CM | POA: Diagnosis not present

## 2015-04-20 DIAGNOSIS — R072 Precordial pain: Secondary | ICD-10-CM | POA: Diagnosis not present

## 2015-04-20 DIAGNOSIS — K219 Gastro-esophageal reflux disease without esophagitis: Secondary | ICD-10-CM | POA: Insufficient documentation

## 2015-04-20 DIAGNOSIS — G250 Essential tremor: Secondary | ICD-10-CM | POA: Insufficient documentation

## 2015-04-20 DIAGNOSIS — Z79899 Other long term (current) drug therapy: Secondary | ICD-10-CM | POA: Insufficient documentation

## 2015-04-20 DIAGNOSIS — Z923 Personal history of irradiation: Secondary | ICD-10-CM | POA: Insufficient documentation

## 2015-04-20 DIAGNOSIS — E785 Hyperlipidemia, unspecified: Secondary | ICD-10-CM | POA: Insufficient documentation

## 2015-04-20 DIAGNOSIS — G4733 Obstructive sleep apnea (adult) (pediatric): Secondary | ICD-10-CM | POA: Diagnosis not present

## 2015-04-20 DIAGNOSIS — F419 Anxiety disorder, unspecified: Secondary | ICD-10-CM | POA: Insufficient documentation

## 2015-04-20 DIAGNOSIS — Z853 Personal history of malignant neoplasm of breast: Secondary | ICD-10-CM

## 2015-04-20 DIAGNOSIS — Z7982 Long term (current) use of aspirin: Secondary | ICD-10-CM | POA: Diagnosis not present

## 2015-04-20 DIAGNOSIS — Z8249 Family history of ischemic heart disease and other diseases of the circulatory system: Secondary | ICD-10-CM | POA: Insufficient documentation

## 2015-04-20 DIAGNOSIS — M8580 Other specified disorders of bone density and structure, unspecified site: Secondary | ICD-10-CM | POA: Insufficient documentation

## 2015-04-20 LAB — BASIC METABOLIC PANEL
Anion gap: 8 (ref 5–15)
BUN: 16 mg/dL (ref 6–20)
CALCIUM: 9.6 mg/dL (ref 8.9–10.3)
CO2: 28 mmol/L (ref 22–32)
CREATININE: 0.75 mg/dL (ref 0.44–1.00)
Chloride: 100 mmol/L — ABNORMAL LOW (ref 101–111)
GFR calc non Af Amer: >60 mL/min (ref >60)
Glucose, Bld: 100 mg/dL — ABNORMAL HIGH (ref 65–99)
Potassium: 3.8 mmol/L (ref 3.5–5.1)
SODIUM: 136 mmol/L (ref 135–145)

## 2015-04-20 LAB — CBC
HCT: 44.6 % (ref 36.0–46.0)
Hemoglobin: 15.3 g/dL — ABNORMAL HIGH (ref 12.0–15.0)
MCH: 30.2 pg (ref 26.0–34.0)
MCHC: 34.3 g/dL (ref 30.0–36.0)
MCV: 88.1 fL (ref 78.0–100.0)
Platelets: 244 10*3/uL (ref 150–400)
RBC: 5.06 MIL/uL (ref 3.87–5.11)
RDW: 13.3 % (ref 11.5–15.5)
WBC: 6.5 10*3/uL (ref 4.0–10.5)

## 2015-04-20 LAB — TROPONIN I
Troponin I: <0.03 ng/mL (ref <0.031)
Troponin I: <0.03 ng/mL (ref <0.031)
Troponin I: <0.03 ng/mL (ref <0.031)

## 2015-04-20 LAB — D-DIMER, QUANTITATIVE (NOT AT ARMC): D DIMER QUANT: 0.35 ug{FEU}/mL (ref 0.00–0.50)

## 2015-04-20 LAB — I-STAT TROPONIN, ED: Troponin i, poc: 0 ng/mL (ref 0.00–0.08)

## 2015-04-20 MED ORDER — ONDANSETRON HCL 4 MG/2ML IJ SOLN: 4.0000 mg | Freq: Four times a day (QID) | INTRAMUSCULAR | Status: DC | PRN

## 2015-04-20 MED ORDER — INDAPAMIDE 1.25 MG PO TABS
1.2500 mg | ORAL_TABLET | Freq: Every morning | ORAL | Status: DC
  Administered 2015-04-21: 1.25 mg via ORAL
  Filled 2015-04-20: qty 1

## 2015-04-20 MED ORDER — ASPIRIN 81 MG PO CHEW
324.0000 mg | CHEWABLE_TABLET | Freq: Once | ORAL | Status: AC
  Administered 2015-04-20: 324 mg via ORAL
  Filled 2015-04-20: qty 4

## 2015-04-20 MED ORDER — AZELAIC ACID 15 % EX GEL: 1.0000 "application " | Freq: Every day | CUTANEOUS | Status: DC

## 2015-04-20 MED ORDER — IRBESARTAN 150 MG PO TABS
300.0000 mg | ORAL_TABLET | Freq: Every day | ORAL | Status: DC
  Administered 2015-04-21: 300 mg via ORAL
  Filled 2015-04-20: qty 2

## 2015-04-20 MED ORDER — ASPIRIN EC 81 MG PO TBEC
81.0000 mg | DELAYED_RELEASE_TABLET | Freq: Every day | ORAL | Status: DC
  Administered 2015-04-21: 81 mg via ORAL
  Filled 2015-04-20: qty 1

## 2015-04-20 MED ORDER — ADULT MULTIVITAMIN W/MINERALS CH
1.0000 | ORAL_TABLET | Freq: Every morning | ORAL | Status: DC
  Administered 2015-04-21: 1 via ORAL
  Filled 2015-04-20: qty 1

## 2015-04-20 MED ORDER — PROPRANOLOL HCL ER 160 MG PO CP24
160.0000 mg | ORAL_CAPSULE | Freq: Every morning | ORAL | Status: DC
  Filled 2015-04-20: qty 1

## 2015-04-20 MED ORDER — MORPHINE SULFATE (PF) 2 MG/ML IV SOLN: 1.0000 mg | INTRAVENOUS | Status: DC | PRN

## 2015-04-20 MED ORDER — SM FLAX SEED OIL 1000 MG PO CAPS: 1000.0000 mg | ORAL_CAPSULE | Freq: Every morning | ORAL | Status: DC

## 2015-04-20 MED ORDER — ENOXAPARIN SODIUM 40 MG/0.4ML ~~LOC~~ SOLN
40.0000 mg | SUBCUTANEOUS | Status: DC
  Administered 2015-04-20: 40 mg via SUBCUTANEOUS
  Filled 2015-04-20: qty 0.4

## 2015-04-20 MED ORDER — PANTOPRAZOLE SODIUM 40 MG PO TBEC
40.0000 mg | DELAYED_RELEASE_TABLET | Freq: Every day | ORAL | Status: DC
  Administered 2015-04-20 – 2015-04-21 (×2): 40 mg via ORAL
  Filled 2015-04-20 (×2): qty 1

## 2015-04-20 MED ORDER — PRIMIDONE 50 MG PO TABS
150.0000 mg | ORAL_TABLET | Freq: Every day | ORAL | Status: DC
  Administered 2015-04-21: 150 mg via ORAL
  Filled 2015-04-20 (×2): qty 3

## 2015-04-20 MED ORDER — GI COCKTAIL ~~LOC~~: 30.0000 mL | Freq: Four times a day (QID) | ORAL | Status: DC | PRN

## 2015-04-20 MED ORDER — CALCIUM CARBONATE-VITAMIN D 500-200 MG-UNIT PO TABS
1.0000 | ORAL_TABLET | Freq: Two times a day (BID) | ORAL | Status: DC
  Administered 2015-04-21: 1 via ORAL
  Filled 2015-04-20: qty 1

## 2015-04-20 MED ORDER — ATORVASTATIN CALCIUM 10 MG PO TABS
10.0000 mg | ORAL_TABLET | Freq: Every morning | ORAL | Status: DC
Start: 2015-04-21 — End: 2015-04-21
  Administered 2015-04-21: 10 mg via ORAL
  Filled 2015-04-20: qty 1

## 2015-04-20 MED ORDER — CLONAZEPAM 0.5 MG PO TABS: 0.5000 mg | ORAL_TABLET | Freq: Two times a day (BID) | ORAL | Status: DC | PRN

## 2015-04-20 MED ORDER — CALCIUM CITRATE-VITAMIN D 200-250 MG-UNIT PO TABS: ORAL_TABLET | Freq: Two times a day (BID) | ORAL | Status: DC

## 2015-04-20 MED ORDER — ACETAMINOPHEN 325 MG PO TABS: 650.0000 mg | ORAL_TABLET | ORAL | Status: DC | PRN

## 2015-04-20 NOTE — ED Provider Notes (Signed)
CSN: [id number]     Arrival date & time 04/20/15  0820 History   First MD Initiated Contact with Patient 04/20/15 [phone]     Chief Complaint  Patient presents with  . Chest Pain     (Consider location/radiation/quality/duration/timing/severity/associated sxs/prior Treatment) HPI  80 year old female presents with chest pain that started this morning. She was getting out of the shower and bending over to put on her bra on she developed acute mid chest pain. States it felt like an aching type of pain that radiated down her left arm. There was no acute shortness of breath but she has felt shortness of breath with minimal exertion over the last several days. She has notices blood doing things such as going up the stairs or walking her dog, when typically she would not be short of breath with these activities. Has not had any leg swelling. Denies any diaphoresis or vomiting today. The chest pain resolved on its own after about 10 minutes when she sat down to rest. Has also noticed vertigo when awaking the last few days, none today.  Past Medical History  Diagnosis Date  . Hypertension   . Breast cancer ([place])     Right breast, status post XRT  . Hyperlipidemia   . Essential tremor   . Depression   . Diverticulosis   . Post-menopausal bleeding 01/2000    Huge polyps exc  . Sleep apnea, obstructive     mild  . Elevated LDL cholesterol level   . Osteopenia 8/97    Spine  . Syncope   . Goiter     multinodular  . Colon polyp   . Ovarian cyst     found on CT scan  . Cataracts, both eyes   . Chest pain 2015    recent episode while in sun- recovered in shade with water  . Diverticulitis   . Vitamin D deficiency    Past Surgical History  Procedure Laterality Date  . Breast lumpectomy Right 4/96  . Bilateral cateract surgery  2014  . Hysteroscopy  0/01  . Laparoscopic bilateral salpingo oopherectomy Bilateral 01/07/2014    Procedure: LAPAROSCOPIC BILATERAL SALPINGO OOPHORECTOMY;  Surgeon:  Lyman Speller, MD;  Location: [facility];  Service: Gynecology;  Laterality: Bilateral;   Family History  Problem Relation Age of Onset  . Heart disease Mother   . Thyroid disease Mother   . Heart attack Mother   . Alzheimer's disease Father   . Thyroid disease Maternal Grandmother   . Stroke Maternal Grandmother    Social History  Substance Use Topics  . Smoking status: Never Smoker   . Smokeless tobacco: Never Used  . Alcohol Use: No   OB History    Gravida Para Term Preterm AB TAB SAB Ectopic Multiple Living   3 3        3      Review of Systems  Constitutional: Negative for diaphoresis.  Respiratory: Positive for shortness of breath.   Cardiovascular: Positive for chest pain. Negative for leg swelling.  Gastrointestinal: Negative for vomiting.  All other systems reviewed and are negative.     Allergies  Latex; Flagyl; and Penicillins  Home Medications   Prior to Admission medications   Medication Sig Start Date End Date Taking? Authorizing Provider  aspirin 81 MG tablet Take 1 tablet (81 mg total) by mouth daily. 01/16/13  Yes Charlynne Cousins, MD  atorvastatin (LIPITOR) 10 MG tablet Take 10 mg by mouth every morning.    Yes  Historical Provider, MD  Calcium Citrate-Vitamin D (CITRACAL PETITES/VITAMIN D PO) Take 2 tablets by mouth 2 (two) times daily.   Yes Historical Provider, MD  clonazePAM (KLONOPIN) 0.5 MG tablet Take 0.5 mg by mouth 2 (two) times daily as needed for anxiety.  09/09/14  Yes Historical Provider, MD  FINACEA 15 % cream Apply 1 application topically at bedtime.  12/04/13  Yes Historical Provider, MD  Flaxseed, Linseed, (SM FLAX SEED OIL) 1000 MG CAPS Take 1,000 mg by mouth every morning.    Yes Historical Provider, MD  indapamide (LOZOL) 1.25 MG tablet Take 1.25 mg by mouth every morning.    Yes Historical Provider, MD  Lactobacillus Rhamnosus, GG, (CULTURELLE PO) Take 1 capsule by mouth every morning.    Yes Historical Provider, MD  Multiple  Vitamin (MULTIVITAMIN WITH MINERALS) TABS tablet Take 1 tablet by mouth every morning.   Yes Historical Provider, MD  primidone (MYSOLINE) 50 MG tablet Take 3 tablets (150 mg total) by mouth daily with breakfast. 01/08/14  Yes Megan Salon, MD  propranolol ER (INDERAL LA) 160 MG SR capsule Take 160 mg by mouth every morning.    Yes Historical Provider, MD  valsartan (DIOVAN) 320 MG tablet Take 320 mg by mouth every morning.    Yes Historical Provider, MD   BP 185/91 mmHg  Pulse 78  Temp(Src) 98.2 F (36.8 C) (Oral)  Resp 16  SpO2 100%  LMP 05/25/1999 Physical Exam  Constitutional: She is oriented to person, place, and time. She appears well-developed and well-nourished. No distress.  HENT:  Head: Normocephalic and atraumatic.  Right Ear: External ear normal.  Left Ear: External ear normal.  Nose: Nose normal.  Eyes: Right eye exhibits no discharge. Left eye exhibits no discharge.  Cardiovascular: Normal rate, regular rhythm and normal heart sounds.   Pulmonary/Chest: Effort normal and breath sounds normal. She exhibits no tenderness.  Abdominal: Soft. She exhibits no distension. There is no tenderness.  Musculoskeletal: She exhibits no edema.  Neurological: She is alert and oriented to person, place, and time.  Skin: Skin is warm and dry. She is not diaphoretic.  Nursing note and vitals reviewed.   ED Course  Procedures (including critical care time) Labs Review Labs Reviewed  BASIC METABOLIC PANEL - Abnormal; Notable for the following:    Chloride 100 (*)    Glucose, Bld 100 (*)    All other components within normal limits  CBC - Abnormal; Notable for the following:    Hemoglobin 15.3 (*)    All other components within normal limits  D-DIMER, QUANTITATIVE (NOT AT [facility])  Randolm Idol, ED    Imaging Review Dg Chest 2 View  04/20/2015  CLINICAL DATA:  Shortness of breath and fatigue 3-4 days with mid chest pain radiating to left arm today. EXAM: CHEST  2 VIEW  COMPARISON:  05/30/2010 FINDINGS: Patient slightly rotated to the left. Lungs are adequately inflated without consolidation or effusion. Borderline stable cardiomegaly. Postsurgical change of the right breast and axilla. There are mild degenerative changes of the spine. IMPRESSION: No active cardiopulmonary disease. Electronically Signed   By: Marin Olp M.D.   On: 04/20/2015 09:13   I have personally reviewed and evaluated these images and lab results as part of my medical decision-making.   EKG Interpretation   Date/Time:  Sunday April 20 2015 08:26:01 EST Ventricular Rate:  80 PR Interval:  188 QRS Duration: 86 QT Interval:  385 QTC Calculation: 444 R Axis:   -36 Text Interpretation:  Sinus rhythm Probable left atrial enlargement Left  axis deviation Low voltage, precordial leads Baseline wander in lead(s) V1  no significant change since 2015 Confirmed by Aldrich Lloyd  MD, Aubrey Blackard ([id number])  on 04/20/2015 8:36:29 AM      MDM   Final diagnoses:  None  Chest pain  Patient has dyspnea with minimal exertion and now chest pain. Pain now resolved. EKG without acute changes. Low risk for PE, ddimer negative. Patient's initial troponin is negative. Given progressive dyspnea, concern for anginal cause. Admit to hospitalist for ACS r/o  Of note, patient was accidentally discharged from the system and returned into the computer as a different visit. This is not true, she only has one visit to the ER.     Sherwood Gambler, MD 04/20/15 [phone]

## 2015-04-20 NOTE — ED Notes (Signed)
Pt states that last week, she had nose bleed and vertigo.  Called her doctor but then told them to "forget about it" and thought it was probably just worry over the holidays.  Pt states that she was getting out of the shower this morning and had lt sided CP that radiated down her left arm.  This pain has since subsided.  Hx of htn.  Hypertensive in triage.

## 2015-04-20 NOTE — ED Notes (Signed)
Pt eating meal, temp later

## 2015-04-20 NOTE — ED Notes (Signed)
She has arrived earlier today and has a chart from earlier today.  She was never d'c'd., however, she was inadvertently d/c'd. From Epic and a subsequent chart created.  These charts are to be merged tomorrow.  We are proceeding with admission with this chart.  She presented to E.D. With c/o right-sided chest pain which lasted about ten minutes this morning.  She further reports some mild exertional dyspnea and unusual fatigue x 2-3 weeks.  She is healthy in appearance.  She had neg. Ekg/labs on prior visit and is to be admitted, with which she agrees.

## 2015-04-20 NOTE — H&P (Signed)
[facility] History and Physical  Michelle Fuller [id number] DOB: [birth year] DOA: 04/20/2015  Referring physician: EDP PCP: Limmie Patricia, MD   Chief Complaint: Chest pain  HPI: Michelle Fuller is a 80 y.o. female with past medical history of hypertension and hyperlipidemia came in to the hospital complaining about chest pain. Patient said for the past several days she is feeling fatigued and tired easily. She gets on the treadmill infrequently but last time she did that about a week ago she was short winded, she also noticed that she get SOB when she goes upstairs. Today she developed chest pain when she was getting out of the shower, was substernal, 3 out of 10, dull, radiates to the left shoulder, no association with nausea or diaphoresis. Patient came into the hospital for further evaluation. In the ED first set of cardiac enzymes negative, 12-lead EKG is normal. Heart score is 4 for age and risk factors.   Review of Systems:  Constitutional: negative for anorexia, fevers and sweats Eyes: negative for irritation, redness and visual disturbance Ears, nose, mouth, throat, and face: negative for earaches, epistaxis, nasal congestion and sore throat Respiratory: negative for cough, dyspnea on exertion, sputum and wheezing CardiChest pain, SOB on exertion per history of present illness. Gastrointestinal: negative for abdominal pain, constipation, diarrhea, melena, nausea and vomiting Genitourinary:negative for dysuria, frequency and hematuria Hematologic/lymphatic: negative for bleeding, easy bruising and lymphadenopathy Musculoskeletal:negative for arthralgias, muscle weakness and stiff joints Neurological: negative for coordination problems, gait problems, headaches and weakness Endocrine: negative for diabetic symptoms including polydipsia, polyuria and weight loss Allergic/Immunologic: negative for anaphylaxis, hay fever and urticaria  Past Medical History    Diagnosis Date  . Hypertension   . Breast cancer ([place])     Right breast, status post XRT  . Hyperlipidemia   . Essential tremor   . Depression   . Diverticulosis   . Post-menopausal bleeding 01/2000    Huge polyps exc  . Sleep apnea, obstructive     mild  . Elevated LDL cholesterol level   . Osteopenia 8/97    Spine  . Syncope   . Goiter     multinodular  . Colon polyp   . Ovarian cyst     found on CT scan  . Cataracts, both eyes   . Chest pain 2015    recent episode while in sun- recovered in shade with water  . Diverticulitis   . Vitamin D deficiency    Past Surgical History  Procedure Laterality Date  . Breast lumpectomy Right 4/96  . Bilateral cateract surgery  2014  . Hysteroscopy  0/01  . Laparoscopic bilateral salpingo oopherectomy Bilateral 01/07/2014    Procedure: LAPAROSCOPIC BILATERAL SALPINGO OOPHORECTOMY;  Surgeon: Lyman Speller, MD;  Location: [facility];  Service: Gynecology;  Laterality: Bilateral;   Social History:   reports that she has never smoked. She has never used smokeless tobacco. She reports that she does not drink alcohol or use illicit drugs.  Allergies  Allergen Reactions  . Latex Itching and Rash  . Flagyl [Metronidazole] Hives and Nausea And Vomiting  . Penicillins Hives and Rash    Family History  Problem Relation Age of Onset  . Heart disease Mother   . Thyroid disease Mother   . Heart attack Mother   . Alzheimer's disease Father   . Thyroid disease Maternal Grandmother   . Stroke Maternal Grandmother      Prior to Admission medications   Medication Sig Start Date End  Date Taking? Authorizing Provider  aspirin 81 MG tablet Take 1 tablet (81 mg total) by mouth daily. 01/16/13  Yes Charlynne Cousins, MD  atorvastatin (LIPITOR) 10 MG tablet Take 10 mg by mouth every morning.    Yes Historical Provider, MD  Calcium Citrate-Vitamin D (CITRACAL PETITES/VITAMIN D PO) Take 2 tablets by mouth 2 (two) times daily.   Yes Historical  Provider, MD  clonazePAM (KLONOPIN) 0.5 MG tablet Take 0.5 mg by mouth 2 (two) times daily as needed for anxiety.  09/09/14  Yes Historical Provider, MD  FINACEA 15 % cream Apply 1 application topically at bedtime.  12/04/13  Yes Historical Provider, MD  Flaxseed, Linseed, (SM FLAX SEED OIL) 1000 MG CAPS Take 1,000 mg by mouth every morning.    Yes Historical Provider, MD  indapamide (LOZOL) 1.25 MG tablet Take 1.25 mg by mouth every morning.    Yes Historical Provider, MD  Lactobacillus Rhamnosus, GG, (CULTURELLE PO) Take 1 capsule by mouth every morning.    Yes Historical Provider, MD  Multiple Vitamin (MULTIVITAMIN WITH MINERALS) TABS tablet Take 1 tablet by mouth every morning.   Yes Historical Provider, MD  primidone (MYSOLINE) 50 MG tablet Take 3 tablets (150 mg total) by mouth daily with breakfast. 01/08/14  Yes Megan Salon, MD  propranolol ER (INDERAL LA) 160 MG SR capsule Take 160 mg by mouth every morning.    Yes Historical Provider, MD  valsartan (DIOVAN) 320 MG tablet Take 320 mg by mouth every morning.    Yes Historical Provider, MD   Physical Exam: Filed Vitals:   04/20/15 1128 04/20/15 1209  BP: 177/85 186/96  Pulse: 72 69  Temp: 97.7 F (36.5 C)   Resp: 25 23   Constitutional: Oriented to person, place, and time. Well-developed and well-nourished. Cooperative.  Head: Normocephalic and atraumatic.  Nose: Nose normal.  Mouth/Throat: Uvula is midline, oropharynx is clear and moist and mucous membranes are normal.  Eyes: Conjunctivae and EOM are normal. Pupils are equal, round, and reactive to light.  Neck: Trachea normal and normal range of motion. Neck supple.  Cardiovascular: Normal rate, regular rhythm, S1 normal, S2 normal, normal heart sounds and intact distal pulses.   Pulmonary/Chest: Effort normal and breath sounds normal.  Abdominal: Soft. Bowel sounds are normal. There is no hepatosplenomegaly. There is no tenderness.  Musculoskeletal: Normal range of motion.    Neurological: Alert and oriented to person, place, and time. Has normal strength. No cranial nerve deficit or sensory deficit.  Skin: Skin is warm, dry and intact.  Psychiatric: Has a normal mood and affect. Speech is normal and behavior is normal.   Labs on Admission:  Basic Metabolic Panel:  Recent Labs Lab 04/20/15 0855  NA 136  K 3.8  CL 100*  CO2 28  GLUCOSE 100*  BUN 16  CREATININE 0.75  CALCIUM 9.6   Liver Function Tests: No results for input(s): AST, ALT, ALKPHOS, BILITOT, PROT, ALBUMIN in the last 168 hours. No results for input(s): LIPASE, AMYLASE in the last 168 hours. No results for input(s): AMMONIA in the last 168 hours. CBC:  Recent Labs Lab 04/20/15 0855  WBC 6.5  HGB 15.3*  HCT 44.6  MCV 88.1  PLT 244   Cardiac Enzymes: No results for input(s): CKTOTAL, CKMB, CKMBINDEX, TROPONINI in the last 168 hours.  BNP (last 3 results) No results for input(s): BNP in the last 8760 hours.  ProBNP (last 3 results) No results for input(s): PROBNP in the last 8760 hours.  CBG: No results for input(s): GLUCAP in the last 168 hours.  Radiological Exams on Admission: Dg Chest 2 View  04/20/2015  CLINICAL DATA:  Shortness of breath and fatigue 3-4 days with mid chest pain radiating to left arm today. EXAM: CHEST  2 VIEW COMPARISON:  05/30/2010 FINDINGS: Patient slightly rotated to the left. Lungs are adequately inflated without consolidation or effusion. Borderline stable cardiomegaly. Postsurgical change of the right breast and axilla. There are mild degenerative changes of the spine. IMPRESSION: No active cardiopulmonary disease. Electronically Signed   By: Marin Olp M.D.   On: 04/20/2015 09:13    EKG: Independently reviewed.   Assessment/Plan Principal Problem:   Chest pain Active Problems:   HX: breast cancer   GERD (gastroesophageal reflux disease)    Chest pain Atypical chest pain with being substernal, but not squeezing in nature. Currently  chest pain-free, this resolve on its own. HEART score of 4 for age and risk factors, will observe overnight to rule out ACS. Cycle 3 sets of cardiac enzymes, recheck EKG in a.m. D-dimer is negative.  GERD Patient said she gets 2 weeks of acid reducing medicine every 3 months. Even though, the pain she got today did not sound like GERD pain. Started on GI cocktail and Protonix.  History of breast cancer Stable, follows with PCP.   Code Status: full code  Family Communication: plan discussed with the patient in the presence of her daughter-in-law at bedside.  Disposition Plan: telemetry  Time spent: 70 minutes  Jaydan Chretien A, MD [facility] Pager [phone]

## 2015-04-20 NOTE — ED Notes (Signed)
Pt. Not in rm. 15 yet.

## 2015-04-21 ENCOUNTER — Encounter (HOSPITAL_COMMUNITY): Payer: Self-pay | Admitting: Physician Assistant

## 2015-04-21 ENCOUNTER — Other Ambulatory Visit: Payer: Self-pay | Admitting: Physician Assistant

## 2015-04-21 ENCOUNTER — Telehealth: Payer: Self-pay | Admitting: Physician Assistant

## 2015-04-21 DIAGNOSIS — R072 Precordial pain: Secondary | ICD-10-CM

## 2015-04-21 DIAGNOSIS — I959 Hypotension, unspecified: Secondary | ICD-10-CM | POA: Diagnosis not present

## 2015-04-21 DIAGNOSIS — R0602 Shortness of breath: Secondary | ICD-10-CM | POA: Diagnosis not present

## 2015-04-21 DIAGNOSIS — R079 Chest pain, unspecified: Secondary | ICD-10-CM | POA: Diagnosis not present

## 2015-04-21 DIAGNOSIS — I441 Atrioventricular block, second degree: Secondary | ICD-10-CM

## 2015-04-21 DIAGNOSIS — K219 Gastro-esophageal reflux disease without esophagitis: Secondary | ICD-10-CM | POA: Diagnosis not present

## 2015-04-21 LAB — T4, FREE: Free T4: 0.84 ng/dL (ref 0.61–1.12)

## 2015-04-21 LAB — TSH: TSH: 1.232 u[IU]/mL (ref 0.350–4.500)

## 2015-04-21 MED ORDER — PROPRANOLOL HCL ER 60 MG PO CP24
60.0000 mg | ORAL_CAPSULE | Freq: Every day | ORAL | Status: DC
  Administered 2015-04-21: 60 mg via ORAL
  Filled 2015-04-21: qty 1

## 2015-04-21 MED ORDER — PROPRANOLOL HCL ER 60 MG PO CP24: 60.0000 mg | ORAL_CAPSULE | Freq: Every day | ORAL | Status: DC

## 2015-04-21 NOTE — Telephone Encounter (Addendum)
Dr. Meda Coffee saw patient this afternoon at [facility] and called me to discuss further recommendations. She has ordered the patient have a Lexiscan nuclear stress test (for chest pain) and also a 48 hour Holter monitor (to evaluate heart block). I have confirmed that Dr. Meda Coffee is aware the patient had 2nd degree heart block during this recent hospital admission and Dr. Meda Coffee affirms she would like to proceed with a [place] study. She feels it is safe to proceed with this test as she believes this heart block was a transient event due to higher dose beta blocker (which is being reduced). Please schedule the Lexiscan nuclear stress test a few days out - i.e. at least 4 days from now - to allow time for the lower dose beta blocker to settle. She can get Holter around this time as well.  Also - please schedule f/u in 2 weeks, ok for APP appointment.  Please call patient with this info.  Dayna Dunn PA-C

## 2015-04-21 NOTE — Discharge Summary (Signed)
Physician Discharge Summary  Michelle Fuller [id number] DOB: 04-03-1935 DOA: 04/20/2015  PCP: Limmie Patricia, MD  Admit date: 04/20/2015 Discharge date: 04/21/2015  Time spent: 40 minutes  Recommendations for Outpatient Follow-up:  1. Follow up with Cardiology for stress testing and Holter monitoring.   Discharge Diagnoses:  Principal Problem:   Chest pain Active Problems:   HX: breast cancer   GERD (gastroesophageal reflux disease)   Mobitz type II atrioventricular block   Discharge Condition: Stable  Diet recommendation: Heart healthy  Filed Weights   04/20/15 1431  Weight: 68.04 kg (150 lb)    History of present illness:  Michelle Fuller is a 79 y.o. female with past medical history of hypertension and hyperlipidemia came in to the hospital complaining about chest pain. Patient said for the past several days she is feeling fatigued and tired easily. She gets on the treadmill infrequently but last time she did that about a week ago she was short winded, she also noticed that she get SOB when she goes upstairs. Today she developed chest pain when she was getting out of the shower, was substernal, 3 out of 10, dull, radiates to the left shoulder, no association with nausea or diaphoresis. Patient came into the hospital for further evaluation. In the ED first set of cardiac enzymes negative, 12-lead EKG is normal. Heart score is 4 for age and risk factors.   Hospital Course:   Chest pain Atypical chest pain with being substernal, but not squeezing in nature. Currently chest pain-free, this resolve on its own. HEART score of 4 for age and risk factors, will observe overnight to rule out ACS. D-dimer is negative.  Has 3 negative sets of cardiac enzymes as well as 12 EKG without acute ischemic changes. Seen by cardiology recommended Myoview stress test with outpatient, will be scheduled per cardiology.  Mobitz type II second-degree AV block Intermittent Mobitz  type II second-degree AV block, had it for short periods of time last night and the morning of admission. Patient seen by cardiology, she is back to sinus rhythm. Patient was asymptomatic when she had that, they recommended to decrease propranolol from 160 down to 60 mg. Patient will have Holter monitor as outpatient.   GERD Patient said she gets 2 weeks of acid reducing medicine every 3 months. Even though, the pain she got today did not sound like GERD pain. Started on GI cocktail and Protonix.  History of breast cancer Stable, follows with PCP.   Procedures:  None   Consultations:  Cardiology   Discharge Exam: Filed Vitals:   04/21/15 0219 04/21/15 0548  BP: 132/69 127/64  Pulse: 72 75  Temp: 97.7 F (36.5 C) 98.1 F (36.7 C)  Resp: 18 18   General: Alert and awake, oriented x3, not in any acute distress. HEENT: anicteric sclera, pupils reactive to light and accommodation, EOMI CVS: S1-S2 clear, no murmur rubs or gallops Chest: clear to auscultation bilaterally, no wheezing, rales or rhonchi Abdomen: soft nontender, nondistended, normal bowel sounds, no organomegaly Extremities: no cyanosis, clubbing or edema noted bilaterally Neuro: Cranial nerves II-XII intact, no focal neurological deficits  Discharge Instructions   Discharge Instructions    Diet - low sodium heart healthy    Complete by:  As directed      Increase activity slowly    Complete by:  As directed           Current Discharge Medication List    CONTINUE these medications which have CHANGED  Details  propranolol ER (INDERAL LA) 60 MG 24 hr capsule Take 1 capsule (60 mg total) by mouth daily. Qty: 30 capsule, Refills: 0      CONTINUE these medications which have NOT CHANGED   Details  aspirin 81 MG tablet Take 1 tablet (81 mg total) by mouth daily. Qty: 30 tablet    atorvastatin (LIPITOR) 10 MG tablet Take 10 mg by mouth every morning.     Calcium Citrate-Vitamin D (CITRACAL  PETITES/VITAMIN D PO) Take 2 tablets by mouth 2 (two) times daily.    clonazePAM (KLONOPIN) 0.5 MG tablet Take 0.5 mg by mouth 2 (two) times daily as needed for anxiety.     FINACEA 15 % cream Apply 1 application topically at bedtime.     Flaxseed, Linseed, (SM FLAX SEED OIL) 1000 MG CAPS Take 1,000 mg by mouth every morning.     indapamide (LOZOL) 1.25 MG tablet Take 1.25 mg by mouth every morning.     Lactobacillus Rhamnosus, GG, (CULTURELLE PO) Take 1 capsule by mouth every morning.     Multiple Vitamin (MULTIVITAMIN WITH MINERALS) TABS tablet Take 1 tablet by mouth every morning.    primidone (MYSOLINE) 50 MG tablet Take 3 tablets (150 mg total) by mouth daily with breakfast.    valsartan (DIOVAN) 320 MG tablet Take 320 mg by mouth every morning.        Allergies  Allergen Reactions  . Latex Itching and Rash  . Flagyl [Metronidazole] Hives and Nausea And Vomiting  . Penicillins Hives and Rash   Follow-up Information    Follow up with ALTHEIMER,MICHAEL D, MD In 1 week.   Specialty:  Endocrinology   Contact information:   [place] [address] [phone]       Follow up with Dorothy Spark, MD.   Specialty:  Cardiology   Why:  Office will call you for your followup appointments and testing (stress test, heart monitor). Call office if you have not heard back in 3 days.   Contact information:   [address] [place]  [address] [phone]        The results of significant diagnostics from this hospitalization (including imaging, microbiology, ancillary and laboratory) are listed below for reference.    Significant Diagnostic Studies: Dg Chest 2 View  04/20/2015  CLINICAL DATA:  Shortness of breath and fatigue 3-4 days with mid chest pain radiating to left arm today. EXAM: CHEST  2 VIEW COMPARISON:  05/30/2010 FINDINGS: Patient slightly rotated to the left. Lungs are adequately inflated without consolidation or  effusion. Borderline stable cardiomegaly. Postsurgical change of the right breast and axilla. There are mild degenerative changes of the spine. IMPRESSION: No active cardiopulmonary disease. Electronically Signed   By: Marin Olp M.D.   On: 04/20/2015 09:13    Microbiology: No results found for this or any previous visit (from the past 240 hour(s)).   Labs: Basic Metabolic Panel:  Recent Labs Lab 04/20/15 0855  NA 136  K 3.8  CL 100*  CO2 28  GLUCOSE 100*  BUN 16  CREATININE 0.75  CALCIUM 9.6   Liver Function Tests: No results for input(s): AST, ALT, ALKPHOS, BILITOT, PROT, ALBUMIN in the last 168 hours. No results for input(s): LIPASE, AMYLASE in the last 168 hours. No results for input(s): AMMONIA in the last 168 hours. CBC:  Recent Labs Lab 04/20/15 0855  WBC 6.5  HGB 15.3*  HCT 44.6  MCV 88.1  PLT 244  Cardiac Enzymes:  Recent Labs Lab 04/20/15 1455 04/20/15 1814 04/20/15 2110  TROPONINI <0.03 <0.03 <0.03   BNP: BNP (last 3 results) No results for input(s): BNP in the last 8760 hours.  ProBNP (last 3 results) No results for input(s): PROBNP in the last 8760 hours.  CBG: No results for input(s): GLUCAP in the last 168 hours.     Signed:  Cabrini Ruggieri A  [facility] 04/21/2015, 2:06 PM

## 2015-04-21 NOTE — Consult Note (Addendum)
Cardiology Consultation Note  Patient ID: Michelle Fuller, MRN: [id number], DOB/AGE: [birth year] 80 y.o. Admit date: 04/20/2015   Date of Consult: 04/21/2015 Primary Physician: Michelle Patricia, MD Primary Cardiologist: New to Michelle Fuller  Chief Complaint: chest pain Reason for Consultation: 2nd degree heart block  HPI: Michelle Fuller is a 80 y/o F with history of HTN, HLD, breast CA s/p XRT, diverticulosis who presented to [facility] for evaluation of chest pain. She has no prior cardiac history - had an echo in 12/2012 showing mild LVH, EF 55-60%, grade 1 DD, trivial AI/MR, mildly dilated LA and carotid duplex 1-39% BICA without significant plaque. These were done during an admission for transient visual disturbance with old stroke seen on CT head but MRI unremarkable. She was evaluated earlier this year for excessive daytime fatigue at which time sleep study did not show sleep apnea but did show frequent leg movements disrupting sleep.    She was in her USOH yesterday AM. She took a shower and began getting ready for the day when she developed central chest discomfort, mild, about 3/10. She felt the need to sit down. She felt some radiation into her left shoulder. She denies any other associated symptoms including SOB, diaphoresis, nausea, vomiting or palpitations. It was not made worse by anything in particular. It spontaneously resolved after 10 minutes. She decided to come to the hospital to be checked out. She also has reported feeling fatigued easily. She recalls retrospectively that she's had occasional jaw pain but isn't able to provide more details because she hadn't really paid much attention to it. She leads a sedentary life due to back pain issues - hired out the cleaning of her house. She does Tai Chi 2x a week. She recalls two days last week where she got up to go use the bathroom and while sitting on the commode she felt like the floor was "swirling." Both espisodes lasted about 5 minutes and  resolved spontaneously. This reminded her of prior inner ear issues. She denies a history of syncope or near-syncope. She has been on propranolol due to history of HTN and nervousness. Mother died at 59 of MI which the patient says doctors attributed to mistreatment of thyroid disease in her earlier years.  Thus far, workup notable for neg troponin x 4, neg d-dimer, unremarkable CBC/BMET. Telemetry has revealed several episodes of 2nd degree heart block with brief runs of 2:1 block - occuring yesterday evening and from about 7am onward this morning. This was not able to be captured on EKG, only through rhythm strips. She has not had any sustained bradycardia with these events. She also has occasional PVCs.   Past Medical History  Diagnosis Date  . Hypertension   . Breast cancer ([place])     Right breast, status post XRT  . Hyperlipidemia   . Essential tremor   . Depression   . Diverticulosis   . Post-menopausal bleeding 01/2000    Huge polyps exc  . Elevated LDL cholesterol level   . Osteopenia 8/97    Spine  . Syncope   . Goiter     multinodular  . Colon polyp   . Ovarian cyst     found on CT scan  . Cataracts, both eyes   . Chest pain 2015    recent episode while in sun- recovered in shade with water  . Diverticulitis   . Vitamin D deficiency       Surgical History:  Past Surgical History  Procedure Laterality  Date  . Breast lumpectomy Right 4/96  . Bilateral cateract surgery  2014  . Hysteroscopy  0/01  . Laparoscopic bilateral salpingo oopherectomy Bilateral 01/07/2014    Procedure: LAPAROSCOPIC BILATERAL SALPINGO OOPHORECTOMY;  Surgeon: Michelle Speller, MD;  Location: [facility];  Service: Gynecology;  Laterality: Bilateral;     Home Meds: Prior to Admission medications   Medication Sig Start Date End Date Taking? Authorizing Provider  aspirin 81 MG tablet Take 1 tablet (81 mg total) by mouth daily. 01/16/13  Yes Michelle Cousins, MD  atorvastatin (LIPITOR) 10 MG  tablet Take 10 mg by mouth every morning.    Yes Historical Provider, MD  Calcium Citrate-Vitamin D (CITRACAL PETITES/VITAMIN D PO) Take 2 tablets by mouth 2 (two) times daily.   Yes Historical Provider, MD  clonazePAM (KLONOPIN) 0.5 MG tablet Take 0.5 mg by mouth 2 (two) times daily as needed for anxiety.  09/09/14  Yes Historical Provider, MD  FINACEA 15 % cream Apply 1 application topically at bedtime.  12/04/13  Yes Historical Provider, MD  Flaxseed, Linseed, (SM FLAX SEED OIL) 1000 MG CAPS Take 1,000 mg by mouth every morning.    Yes Historical Provider, MD  indapamide (LOZOL) 1.25 MG tablet Take 1.25 mg by mouth every morning.    Yes Historical Provider, MD  Lactobacillus Rhamnosus, GG, (CULTURELLE PO) Take 1 capsule by mouth every morning.    Yes Historical Provider, MD  Multiple Vitamin (MULTIVITAMIN WITH MINERALS) TABS tablet Take 1 tablet by mouth every morning.   Yes Historical Provider, MD  primidone (MYSOLINE) 50 MG tablet Take 3 tablets (150 mg total) by mouth daily with breakfast. 01/08/14  Yes Michelle Salon, MD  propranolol ER (INDERAL LA) 160 MG SR capsule Take 160 mg by mouth every morning.    Yes Historical Provider, MD  valsartan (DIOVAN) 320 MG tablet Take 320 mg by mouth every morning.    Yes Historical Provider, MD   Inpatient Medications:   . aspirin EC  81 mg Oral Daily  . atorvastatin  10 mg Oral q morning - 10a  . calcium-vitamin D  1 tablet Oral BID  . enoxaparin (LOVENOX) injection  40 mg Subcutaneous Q24H  . indapamide  1.25 mg Oral q morning - 10a  . irbesartan  300 mg Oral Daily  . multivitamin with minerals  1 tablet Oral q morning - 10a  . pantoprazole  40 mg Oral Daily  . primidone  150 mg Oral Q breakfast  . propranolol ER  160 mg Oral q morning - 10a   Allergies:   Allergies  Allergen Reactions  . Latex Itching and Rash  . Flagyl [Metronidazole] Hives and Nausea And Vomiting  . Penicillins Hives and Rash   Social History   Social History  .  Marital Status: Widowed    Spouse Name: N/A  . Number of Children: 3  . Years of Education: BA   Occupational History  . Retired Pharmacist, hospital    Social History Main Topics  . Smoking status: Never Smoker   . Smokeless tobacco: Never Used  . Alcohol Use: No  . Drug Use: No  . Sexual Activity: No   Other Topics Concern  . Not on file   Social History Narrative   Widowed.  Lives alone.  Ambulates without assistance.   Drinks about 1 cup of green tea a day      Family History  Problem Relation Age of Onset  . Heart disease Mother  Died at 72 of MI - says doctors thought it was due to mistreatment of thyroid dz in youth  . Thyroid disease Mother   . Heart attack Mother   . Alzheimer's disease Father   . Thyroid disease Maternal Grandmother   . Stroke Maternal Grandmother      Review of Systems: All other systems reviewed and are otherwise negative except as noted above.  Labs:  Recent Labs  04/20/15 1455 04/20/15 1814 04/20/15 2110  TROPONINI <0.03 <0.03 <0.03   Lab Results  Component Value Date   WBC 6.5 04/20/2015   HGB 15.3* 04/20/2015   HCT 44.6 04/20/2015   MCV 88.1 04/20/2015   PLT 244 04/20/2015    Recent Labs Lab 04/20/15 0855  NA 136  K 3.8  CL 100*  CO2 28  BUN 16  CREATININE 0.75  CALCIUM 9.6  GLUCOSE 100*   Lab Results  Component Value Date   CHOL 154 01/16/2013   HDL 36* 01/16/2013   LDLCALC 79 01/16/2013   TRIG 197* 01/16/2013   Lab Results  Component Value Date   DDIMER 0.35 04/20/2015    Radiology/Studies:  Dg Chest 2 View  04/20/2015  CLINICAL DATA:  Shortness of breath and fatigue 3-4 days with mid chest pain radiating to left arm today. EXAM: CHEST  2 VIEW COMPARISON:  05/30/2010 FINDINGS: Patient slightly rotated to the left. Lungs are adequately inflated without consolidation or effusion. Borderline stable cardiomegaly. Postsurgical change of the right breast and axilla. There are mild degenerative changes of the spine.  IMPRESSION: No active cardiopulmonary disease. Electronically Signed   By: Marin Olp M.D.   On: 04/20/2015 09:13    Wt Readings from Last 3 Encounters:  04/20/15 150 lb (68.04 kg)  03/11/15 151 lb (68.493 kg)  01/22/15 150 lb (68.04 kg)   EKG: 1) NSR 80bpm, no acute ST-T changes 2) NSR 69bpm, no acute ST-T changes  Physical Exam: Blood pressure 127/64, pulse 75, temperature 98.1 F (36.7 C), temperature source Oral, resp. rate 18, height $RemoveBe'5\' 4"'vnOgMGozw$  (1.626 m), weight 150 lb (68.04 kg), last menstrual period 05/25/1999, SpO2 98 %. General: Well developed, well nourished WF, in no acute distress. Head: Normocephalic, atraumatic, sclera non-icteric, no xanthomas, nares are without discharge.  Neck: Negative for carotid bruits. JVD not elevated. Lungs: Clear bilaterally to auscultation without wheezes, rales, or rhonchi. Breathing is unlabored. Heart: RRR with S1 S2. No murmurs, rubs, or gallops appreciated. Abdomen: Soft, non-tender, non-distended with normoactive bowel sounds. No hepatomegaly. No rebound/guarding. No obvious abdominal masses. Msk:  Strength and tone appear normal for age. Extremities: No clubbing or cyanosis. No edema.  Distal pedal pulses are 2+ and equal bilaterally. Neuro: Alert and oriented X 3. No facial asymmetry. No focal deficit. Moves all extremities spontaneously. Psych:  Responds to questions appropriately with a normal affect.     Assessment and Plan:   1. Chest pain - some mixed atypical/typical features and no objective evidence of ischemia thus far. Risk factors include HTN, HLD. Will review case with MD to decide on further testing. She would not be a candidate for Lexiscan due to #2, and I am not certain she can walk on a treadmill with chronic back issues.  2. 2nd degree heart block with brief episodes of 2:1 block - not clearly symptomatic while in the hospital, but she does recall two episodes last week of the "floor swirling." Will stop beta blocker -  she was on fairly high dose of propranolol. Check TSH  and free T4. Sleep study recently ruled out sleep apnea. Will review strips with MD and discuss plan - this appears to be type 2 second degree heart block but at times it is difficult to tell due to artifact on telemetry.  3. HTN - will likely need to substitute a different medicine for her BB, such as amlodipine but this can be decided as outpatient.  Signed, Michelle Pitter PA-C 04/21/2015, 9:58 AM Pager: [phone]  The patient was seen, examined and discussed with Melina Copa, PA-C and I agree with the above.   I would suggest an outpatient Lexiscan nuclear stress testing for the evaluation of chest pain. The patient states that she was on propranolol for anxiety, but lately feels rather down and very tired, also has measured low blood pressures on multiple occasions when she felt tired.  We will decrease the dose of propranolol ER to 60 mg daily and schedule an outpatient 48 hour Holter monitor in a week. We will schedule a follow up clinic visit in 2 weeks to reassess results and to  Evaluate the blood pressure after decreasing propranolol.  She can be discharged home today.  Dorothy Spark 04/21/2015

## 2015-04-21 NOTE — Progress Notes (Signed)
At [place], [place], from [facility] telemetry monitor calls to notify us that the pt changed from [place] at 2258 last night into Second Degree Type 2 and it was not noted until now when the pt has just changed back into SR.  Pt has been without complaints, no pain, VSS,  All three troponis were negative. Minky has loaded the strips into the chart and I have informed Katharine Look, the day nurse who acknowledges and states she will notify the MD.

## 2015-04-23 ENCOUNTER — Telehealth (HOSPITAL_COMMUNITY): Payer: Self-pay | Admitting: *Deleted

## 2015-04-23 NOTE — Telephone Encounter (Signed)
Patient given detailed instructions per Myocardial Perfusion Study Information Sheet for the test on  04/28/15 at 730. Patient notified to arrive 15 minutes early and that it is imperative to arrive on time for appointment to keep from having the test rescheduled.  If you need to cancel or reschedule your appointment, please call the office within 24 hours of your appointment. Failure to do so may result in a cancellation of your appointment, and a $50 no show fee. Patient verbalized understanding.Hubbard Robinson, RN

## 2015-04-24 ENCOUNTER — Other Ambulatory Visit: Payer: Self-pay | Admitting: Physician Assistant

## 2015-04-24 DIAGNOSIS — I441 Atrioventricular block, second degree: Secondary | ICD-10-CM

## 2015-04-28 ENCOUNTER — Ambulatory Visit (HOSPITAL_COMMUNITY): Payer: Medicare Other | Attending: Cardiology

## 2015-04-28 ENCOUNTER — Ambulatory Visit (INDEPENDENT_AMBULATORY_CARE_PROVIDER_SITE_OTHER): Payer: Medicare Other

## 2015-04-28 VITALS — Ht 64.0 in | Wt 150.0 lb

## 2015-04-28 DIAGNOSIS — I441 Atrioventricular block, second degree: Secondary | ICD-10-CM

## 2015-04-28 DIAGNOSIS — R5383 Other fatigue: Secondary | ICD-10-CM | POA: Diagnosis not present

## 2015-04-28 DIAGNOSIS — R072 Precordial pain: Secondary | ICD-10-CM | POA: Diagnosis not present

## 2015-04-28 DIAGNOSIS — R002 Palpitations: Secondary | ICD-10-CM | POA: Insufficient documentation

## 2015-04-28 DIAGNOSIS — R0602 Shortness of breath: Secondary | ICD-10-CM | POA: Insufficient documentation

## 2015-04-28 DIAGNOSIS — R0609 Other forms of dyspnea: Secondary | ICD-10-CM | POA: Diagnosis not present

## 2015-04-28 DIAGNOSIS — Z8249 Family history of ischemic heart disease and other diseases of the circulatory system: Secondary | ICD-10-CM | POA: Insufficient documentation

## 2015-04-28 DIAGNOSIS — I10 Essential (primary) hypertension: Secondary | ICD-10-CM | POA: Diagnosis not present

## 2015-04-28 DIAGNOSIS — I779 Disorder of arteries and arterioles, unspecified: Secondary | ICD-10-CM | POA: Insufficient documentation

## 2015-04-28 LAB — MYOCARDIAL PERFUSION IMAGING
CHL CUP NUCLEAR SDS: 2
CHL CUP RESTING HR STRESS: 69 {beats}/min
CSEPPHR: 83 {beats}/min
LHR: 0.33
LV dias vol: 81 mL
LVSYSVOL: 27 mL
SRS: 4
SSS: 6
TID: 1.22

## 2015-04-28 MED ORDER — TECHNETIUM TC 99M SESTAMIBI GENERIC - CARDIOLITE
10.6000 | Freq: Once | INTRAVENOUS | Status: AC | PRN
  Administered 2015-04-28: 11 via INTRAVENOUS

## 2015-04-28 MED ORDER — AMINOPHYLLINE 25 MG/ML IV SOLN
75.0000 mg | Freq: Once | INTRAVENOUS | Status: AC
  Administered 2015-04-28: 75 mg via INTRAVENOUS

## 2015-04-28 MED ORDER — TECHNETIUM TC 99M SESTAMIBI GENERIC - CARDIOLITE
32.8000 | Freq: Once | INTRAVENOUS | Status: AC | PRN
  Administered 2015-04-28: 32.8 via INTRAVENOUS

## 2015-04-28 MED ORDER — REGADENOSON 0.4 MG/5ML IV SOLN
0.4000 mg | Freq: Once | INTRAVENOUS | Status: AC
  Administered 2015-04-28: 0.4 mg via INTRAVENOUS

## 2015-05-06 ENCOUNTER — Telehealth: Payer: Self-pay | Admitting: *Deleted

## 2015-05-06 NOTE — Telephone Encounter (Signed)
Clarification needed for this pt in taking propanolol or not:  Notes Recorded by Dorothy Spark, MD on 05/02/2015 at 12:39 PM No pauses or AV block, stay away from propranolol (discontinued at the hospital), no other therapy needed.  Notes Recorded by Hetty Blend, RN on 05/02/2015 at 2:00 PM Notified of holter monitor results. Advised that she should have discontinued the Propranolol. She states that they didn't stop Propranolol but reduced to 60 mg daily. Reviewed d/c summary from 11/28 which states to change dose of Propranolol to 60 mg daily. Will make Dr. Meda Coffee aware.  Will route this message back to Dr Meda Coffee to review and advise on whether the pt should continue taking propanolol 60 mg po daily, or should she d/c this med.

## 2015-05-07 NOTE — Telephone Encounter (Signed)
She should be taking reduced dose of propranolol 60 mg po daily

## 2015-05-07 NOTE — Telephone Encounter (Signed)
Notified the pt that per Dr Meda Coffee, she should continue on her current regimen of reduced dose of propanolol 60 mg po daily.  Pt verbalized understanding and agrees with this plan

## 2015-05-22 ENCOUNTER — Encounter: Payer: Self-pay | Admitting: Cardiology

## 2015-05-22 ENCOUNTER — Ambulatory Visit (INDEPENDENT_AMBULATORY_CARE_PROVIDER_SITE_OTHER): Payer: Medicare Other | Admitting: Cardiology

## 2015-05-22 VITALS — BP 130/88 | HR 82 | Ht 64.0 in | Wt 152.0 lb

## 2015-05-22 DIAGNOSIS — R072 Precordial pain: Secondary | ICD-10-CM

## 2015-05-22 DIAGNOSIS — I10 Essential (primary) hypertension: Secondary | ICD-10-CM

## 2015-05-22 DIAGNOSIS — I4430 Unspecified atrioventricular block: Secondary | ICD-10-CM | POA: Diagnosis not present

## 2015-05-22 NOTE — Progress Notes (Signed)
Patient ID: Michelle Fuller, female   DOB: 22-Oct-1934, 80 y.o.   MRN: [id number]      Cardiology Office Note  Date:  05/22/2015   ID:  Michelle Fuller, DOB [birth year], MRN [id number]  PCP:  Limmie Patricia, MD  Cardiologist: Dorothy Spark, MD   Chief Complaint  Patient presents with  . Follow-up    POST HOSPITAL PT C/O RIGHT LEG SWELLING AND PAIN   History of Present Illness: Michelle Fuller is a 80 y.o. female who presents for post hospitalization follow up. She is a 80 y/o F with history of HTN, HLD, breast CA s/p XRT, diverticulosis who presented to [facility] for evaluation of chest pain. She has no prior cardiac history - had an echo in 12/2012 showing mild LVH, EF 55-60%, grade 1 DD, trivial AI/MR, mildly dilated LA and carotid duplex 1-39% BICA without significant plaque. These were done during an admission for transient visual disturbance with old stroke seen on CT head but MRI unremarkable. She was evaluated earlier this year for excessive daytime fatigue at which time sleep study did not show sleep apnea but did show frequent leg movements disrupting sleep.   She was in her USOH yesterday AM. She took a shower and began getting ready for the day when she developed central chest discomfort, mild, about 3/10. She felt the need to sit down. She felt some radiation into her left shoulder. She denies any other associated symptoms including SOB, diaphoresis, nausea, vomiting or palpitations. It was not made worse by anything in particular. It spontaneously resolved after 10 minutes. She decided to come to the hospital to be checked out. She also has reported feeling fatigued easily. She recalls retrospectively that she's had occasional jaw pain but isn't able to provide more details because she hadn't really paid much attention to it. She leads a sedentary life due to back pain issues - hired out the cleaning of her house. She does Tai Chi 2x a week. She recalls two days last week  where she got up to go use the bathroom and while sitting on the commode she felt like the floor was "swirling." Both espisodes lasted about 5 minutes and resolved spontaneously. This reminded her of prior inner ear issues. She denies a history of syncope or near-syncope. She has been on propranolol due to history of HTN and nervousness. Mother died at 45 of MI which the patient says doctors attributed to mistreatment of thyroid disease in her earlier years.  Thus far, workup notable for neg troponin x 4, neg d-dimer, unremarkable CBC/BMET. Telemetry has revealed several episodes of 2nd degree heart block with brief runs of 2:1 block - occuring yesterday evening and from about Michelle Fuller. This was not able to be captured on EKG, only through rhythm strips. She has not had any sustained bradycardia with these events. She also has occasional PVCs.  She underwent an outpatient stress test that was normal. Propranolol was decreased from 180 to 60 mg daily, repeat 48 Holter showed no AVB or pauses. Today she denies any chest pain, DOE, any dizziness or falls but complains of balance problems.   Past Medical History  Diagnosis Date  . Hypertension   . Breast cancer ([place])     Right breast, status post XRT  . Hyperlipidemia   . Essential tremor   . Depression   . Diverticulosis   . Post-menopausal bleeding 01/2000    Huge polyps exc  . Elevated LDL cholesterol level   .  Osteopenia 8/97    Spine  . Syncope   . Goiter     multinodular  . Colon polyp   . Ovarian cyst     found on CT scan  . Cataracts, both eyes   . Chest pain 2015    recent episode while in sun- recovered in shade with water  . Diverticulitis   . Vitamin D deficiency     Past Surgical History  Procedure Laterality Date  . Breast lumpectomy Right 4/96  . Bilateral cateract surgery  2014  . Hysteroscopy  0/01  . Laparoscopic bilateral salpingo oopherectomy Bilateral 01/07/2014    Procedure: LAPAROSCOPIC  BILATERAL SALPINGO OOPHORECTOMY;  Surgeon: Lyman Speller, MD;  Location: [facility];  Service: Gynecology;  Laterality: Bilateral;     Current Outpatient Prescriptions  Medication Sig Dispense Refill  . aspirin 81 MG tablet Take 1 tablet (81 mg total) by mouth daily. 30 tablet   . atorvastatin (LIPITOR) 10 MG tablet Take 10 mg by mouth every Fuller.     . Calcium Citrate-Vitamin D (CITRACAL PETITES/VITAMIN D PO) Take 2 tablets by mouth 2 (two) times daily.    . clonazePAM (KLONOPIN) 0.5 MG tablet Take 0.5 mg by mouth 2 (two) times daily as needed for anxiety.     Marland Kitchen FINACEA 15 % cream Apply 1 application topically at bedtime.     . Flaxseed, Linseed, (SM FLAX SEED OIL) 1000 MG CAPS Take 1,000 mg by mouth every Fuller.     . indapamide (LOZOL) 1.25 MG tablet Take 1.25 mg by mouth every Fuller.     . Lactobacillus Rhamnosus, GG, (CULTURELLE PO) Take 1 capsule by mouth every Fuller.     . Multiple Vitamin (MULTIVITAMIN WITH MINERALS) TABS tablet Take 1 tablet by mouth every Fuller.    . primidone (MYSOLINE) 50 MG tablet Take 3 tablets (150 mg total) by mouth daily with breakfast.    . propranolol ER (INDERAL LA) 60 MG 24 hr capsule Take 1 capsule (60 mg total) by mouth daily. 30 capsule 0  . valsartan (DIOVAN) 320 MG tablet Take 320 mg by mouth every Fuller.      No current facility-administered medications for this visit.    Allergies:   Latex; Flagyl; and Penicillins    Social History:  The patient  reports that she has never smoked. She has never used smokeless tobacco. She reports that she does not drink alcohol or use illicit drugs.   Family History:  The patient's family history includes Alzheimer's disease in her father; Heart attack in her mother; Heart disease in her mother; Stroke in her maternal grandmother; Thyroid disease in her maternal grandmother and mother.    ROS:  Please see the history of present illness.   Otherwise, review of systems are positive for none.    All other systems are reviewed and negative.    PHYSICAL EXAM: VS:  BP 130/88 mmHg  Pulse 82  Ht '5\' 4"'$  (1.626 m)  Wt 152 lb (68.947 kg)  BMI 26.08 kg/m2  SpO2 96%  LMP 05/25/1999 , BMI Body mass index is 26.08 kg/(m^2). GEN: Well nourished, well developed, in no acute distress HEENT: normal Neck: no JVD, carotid bruits, or masses Cardiac: RRR; no murmurs, rubs, or gallops,no edema  Respiratory:  clear to auscultation bilaterally, normal work of breathing GI: soft, nontender, nondistended, + BS MS: no deformity or atrophy Skin: warm and dry, no rash Neuro:  Strength and sensation are intact Psych: euthymic mood, full affect  Recent Labs: 04/20/2015: BUN 16; Creatinine, Ser 0.75; Hemoglobin 15.3*; Platelets 244; Potassium 3.8; Sodium 136 04/21/2015: TSH 1.232   Lipid Panel    Component Value Date/Time   CHOL 154 01/16/2013 0356   TRIG 197* 01/16/2013 0356   HDL 36* 01/16/2013 0356   CHOLHDL 4.3 01/16/2013 0356   VLDL 39 01/16/2013 0356   LDLCALC 79 01/16/2013 0356   Wt Readings from Last 3 Encounters:  05/22/15 152 lb (68.947 kg)  04/28/15 150 lb (68.04 kg)  04/20/15 150 lb (68.04 kg)    Lexiscan nuclear stress test: 04/28/15   Nuclear stress EF: 66%.  There was no ST segment deviation noted during stress.  This is a low risk study.  The left ventricular ejection fraction is hyperdynamic (>65%).  1. Fixed small, mild basal anterolateral perfusion defect. Given normal wall motion, this likely represents attenuation. No ischemia.  2. Normal wall motion and normal EF. 3. Low risk study.    ASSESSMENT AND PLAN:  1. Chest pain - some mixed atypical/typical features and no objective evidence of ischemia thus far. Risk factors include HTN, HLD.  Negative stress test on 04/28/2015.  2. 2nd degree heart block with brief episodes of 2:1 block - not clearly symptomatic while in the hospital, but she does recall two episodes last week of the "floor swirling." on  decreased propranolol dose 180--> 60 daily no AVB on 48 Holter on 04/28/15.  3. HTN - controlled.  Follow up in 6 months.  Signed, Dorothy Spark, MD  05/22/2015 9:38 AM    [address], [place], [place]  [address] Phone: [phone]; Fax: [phone]

## 2015-05-22 NOTE — Patient Instructions (Signed)
Your physician recommends that you continue on your current medications as directed. Please refer to the Current Medication list given to you today.     Your physician wants you to follow-up in: 6 MONTHS WITH DR NELSON You will receive a reminder letter in the mail two months in advance. If you don't receive a letter, please call our office to schedule the follow-up appointment.  

## 2015-06-04 ENCOUNTER — Encounter: Payer: Self-pay | Admitting: Gastroenterology

## 2015-09-26 ENCOUNTER — Encounter: Payer: Self-pay | Admitting: Cardiology

## 2015-11-13 ENCOUNTER — Ambulatory Visit: Payer: Medicare Other | Admitting: Cardiology

## 2015-11-20 ENCOUNTER — Ambulatory Visit: Payer: Medicare Other | Admitting: Cardiology

## 2015-12-12 ENCOUNTER — Ambulatory Visit (INDEPENDENT_AMBULATORY_CARE_PROVIDER_SITE_OTHER): Payer: Medicare Other | Admitting: Obstetrics & Gynecology

## 2015-12-12 ENCOUNTER — Encounter: Payer: Self-pay | Admitting: Obstetrics & Gynecology

## 2015-12-12 VITALS — BP 128/72 | HR 80 | Resp 14 | Ht 64.5 in | Wt 149.0 lb

## 2015-12-12 DIAGNOSIS — Z01419 Encounter for gynecological examination (general) (routine) without abnormal findings: Secondary | ICD-10-CM | POA: Diagnosis not present

## 2015-12-12 DIAGNOSIS — Z0000 Encounter for general adult medical examination without abnormal findings: Secondary | ICD-10-CM

## 2015-12-12 LAB — POCT URINALYSIS DIPSTICK
Bilirubin, UA: NEGATIVE
Blood, UA: NEGATIVE
Glucose, UA: NEGATIVE
KETONES UA: NEGATIVE
LEUKOCYTES UA: NEGATIVE
NITRITE UA: NEGATIVE
PH UA: 6
PROTEIN UA: NEGATIVE
UROBILINOGEN UA: NEGATIVE

## 2015-12-12 NOTE — Progress Notes (Signed)
81 y.o. G3P3 Widowed Caucasian F here for annual exam.  Doing well.  No vaginal bleeding.  Was in the hospital due to chest pain and hypotension.  Also saw Dr. Rexene Alberts for her tremor.  Will not have regular follow up with him.  Biggest issue for pt is diverticulosis.  Knows colonoscopy is due per Dr. Fuller Plan.  PCP:  Dr. Elyse Hsu  Patient's last menstrual period was 05/25/1999.          Sexually active: Yes.    The current method of family planning is post menopausal status.    Exercising: Yes.    tai chi  Smoker:  no  Health Maintenance: Pap:  09/10/2014 negative  History of abnormal Pap:  yes  MMG:  09/03/2015 BIRADS 2 benign  Colonoscopy:  10/31/2009 diverticulosis repeat 5 years  BMD:   08/28/2014 osteoporosis in wrist  TDaP:   Checked with Dr. Elyse Hsu last year Pneumonia vaccine(s):   Completed per pt Zostavax:   Completed per pt Hep C testing: not indicated  Screening Labs: PCP, Hb today: PCP, Urine today: normal    reports that she has never smoked. She has never used smokeless tobacco. She reports that she does not drink alcohol or use illicit drugs.  Past Medical History  Diagnosis Date  . Hypertension   . Breast cancer ([place])     Right breast, status post XRT  . Hyperlipidemia   . Essential tremor   . Depression   . Diverticulosis   . Post-menopausal bleeding 01/2000    Huge polyps exc  . Elevated LDL cholesterol level   . Osteopenia 8/97    Spine  . Syncope   . Goiter     multinodular  . Colon polyp   . Ovarian cyst     found on CT scan  . Cataracts, both eyes   . Chest pain 2015    recent episode while in sun- recovered in shade with water  . Diverticulitis   . Vitamin D deficiency     Past Surgical History  Procedure Laterality Date  . Breast lumpectomy Right 4/96  . Bilateral cateract surgery  2014  . Hysteroscopy  0/01  . Laparoscopic bilateral salpingo oopherectomy Bilateral 01/07/2014    Procedure: LAPAROSCOPIC BILATERAL SALPINGO OOPHORECTOMY;   Surgeon: Lyman Speller, MD;  Location: [facility];  Service: Gynecology;  Laterality: Bilateral;    Current Outpatient Prescriptions  Medication Sig Dispense Refill  . aspirin 81 MG tablet Take 1 tablet (81 mg total) by mouth daily. 30 tablet   . atorvastatin (LIPITOR) 10 MG tablet Take 10 mg by mouth every morning.     . Calcium Citrate-Vitamin D (CITRACAL PETITES/VITAMIN D PO) Take 2 tablets by mouth 2 (two) times daily.    . clonazePAM (KLONOPIN) 0.5 MG tablet Take 0.5 mg by mouth 2 (two) times daily as needed for anxiety.     Marland Kitchen FINACEA 15 % cream Apply 1 application topically at bedtime.     . Flaxseed, Linseed, (SM FLAX SEED OIL) 1000 MG CAPS Take 1,000 mg by mouth every morning.     . indapamide (LOZOL) 1.25 MG tablet Take 1.25 mg by mouth every morning.     . Lactobacillus Rhamnosus, GG, (CULTURELLE PO) Take 1 capsule by mouth every morning.     . Multiple Vitamin (MULTIVITAMIN WITH MINERALS) TABS tablet Take 1 tablet by mouth every morning.    . primidone (MYSOLINE) 50 MG tablet Take 3 tablets (150 mg total) by mouth daily with breakfast.    .  propranolol ER (INDERAL LA) 60 MG 24 hr capsule Take 1 capsule (60 mg total) by mouth daily. 30 capsule 0  . valsartan (DIOVAN) 320 MG tablet Take 320 mg by mouth every morning.      No current facility-administered medications for this visit.    Family History  Problem Relation Age of Onset  . Heart disease Mother     Died at 95 of MI - says doctors thought it was due to mistreatment of thyroid dz in youth  . Thyroid disease Mother   . Heart attack Mother   . Alzheimer's disease Father   . Thyroid disease Maternal Grandmother   . Stroke Maternal Grandmother     ROS:  Pertinent items are noted in HPI.  Otherwise, a comprehensive ROS was negative.  Exam:   Filed Vitals:   12/12/15 1048  BP: 128/72  Pulse: 80  Resp: 14  Height: 5' 4.5" (1.638 m)  Weight: 149 lb (67.586 kg)    General appearance: alert, cooperative and appears  stated age Head: Normocephalic, without obvious abnormality, atraumatic Neck: no adenopathy, supple, symmetrical, trachea midline and thyroid enlarged and nodular Lungs: clear to auscultation bilaterally Breasts: normal appearance, no masses or tenderness Heart: regular rate and rhythm Abdomen: soft, non-tender; bowel sounds normal; no masses,  no organomegaly Extremities: extremities normal, atraumatic, no cyanosis or edema Skin: Skin color, texture, turgor normal. No rashes or lesions Lymph nodes: Cervical, supraclavicular, and axillary nodes normal. No abnormal inguinal nodes palpated Neurologic: Grossly normal   Pelvic: External genitalia:  no lesions              Urethra:  normal appearing urethra with no masses, tenderness or lesions              Bartholins and Skenes: normal                 Vagina: normal appearing vagina with normal color and discharge, no lesions              Cervix: no lesions              Pap taken: No. Bimanual Exam:  Uterus:  normal size, contour, position, consistency, mobility, non-tender              Adnexa: normal adnexa and no mass, fullness, tenderness               Rectovaginal: Confirms               Anus:  normal sphincter tone, no lesions  Chaperone was present for exam.  A:  Well Woman with normal exam PMP, no HRT H/o multinodular goiter. Followed by Dr. Elyse Hsu H/O breast cancer 4/96 Elevated lipids Hypertension H/O colonic polyps Osteopenia. Decines additional treatment.  Dr. Elyse Hsu has recommended repeat BMD 4/18 Tremor H/O ovarian cyst, s/p lap BSO 8/15 H/O recurrent diverticulitis. Saw Dr. Fuller Plan.  P: Mammogram yearly Pt declines another colonoscopy pap smear obtained 2016.  No pap today.  Pt and I discussed guidelines.  Will not continue doing Pap smears unless pt has an issue. Follow up 1 year or prn

## 2015-12-15 ENCOUNTER — Ambulatory Visit: Payer: Medicare Other | Admitting: Physician Assistant

## 2015-12-17 ENCOUNTER — Telehealth: Payer: Self-pay | Admitting: Gastroenterology

## 2015-12-17 NOTE — Telephone Encounter (Signed)
Patient with gas and bloating.  She is advised to try a low gas diet and I have mailed her a antigas diet.  She will try switching her probiotic .  She wants a follow up with Dr. Fuller Plan and will come in on 02/11/16

## 2016-01-05 ENCOUNTER — Encounter: Payer: Self-pay | Admitting: Cardiology

## 2016-01-05 ENCOUNTER — Ambulatory Visit (INDEPENDENT_AMBULATORY_CARE_PROVIDER_SITE_OTHER): Payer: Medicare Other | Admitting: Cardiology

## 2016-01-05 ENCOUNTER — Encounter (INDEPENDENT_AMBULATORY_CARE_PROVIDER_SITE_OTHER): Payer: Self-pay

## 2016-01-05 VITALS — BP 162/96 | HR 86 | Ht 64.0 in | Wt 148.8 lb

## 2016-01-05 DIAGNOSIS — I159 Secondary hypertension, unspecified: Secondary | ICD-10-CM

## 2016-01-05 NOTE — Patient Instructions (Signed)
Medication Instructions:   Your physician recommends that you continue on your current medications as directed. Please refer to the Current Medication list given to you today.   If you need a refill on your cardiac medications before your next appointment, please call your pharmacy.  Labwork:NONE ORDER TODAY   Testing/Procedures: NONE ORDER TODAY    Follow-Up:  Your physician wants you to follow-up in:  IN  6  MONTHS WITH DR Johann Capers will receive a reminder letter in the mail two months in advance. If you don't receive a letter, please call our office to schedule the follow-up appointment.      Any Other Special Instructions Will Be Listed Below (If Applicable).  YOU HAVE BEEN RECOMMENDED TO MONITOR BLOOD PRESSURE TWICE A DAY FOR ONE WEEK ,  PLEASE CONTACT OFFICE IF BLOOD PRESSURE CONSISTENTLY RUNNING OVER 140/80'S TO POSSIBLY HAVE SOME RECOMMENDATIONS FROM NURSE TRIAGE.

## 2016-01-05 NOTE — Progress Notes (Signed)
01/05/2016 Michelle Fuller   31-Aug-1934  [id number]  Primary Physician Limmie Patricia, MD Primary Cardiologist: Dr. Meda Coffee   Reason for Visit/CC: f/u for bradycardia and HTN  HPI:  The patient is a 81 year old female, followed by Dr. Meda Coffee, with a history of hypertension, hyperlipidemia, breast cancer s/p XRT, diverticulosis  And bradycardia. In November 2016, she was admitted for chest pain. She was also found to be in [place] type II second-degree heart block. Her beta blocker was reduced. Propanolol was decreased from 160 mg to 60 mg. She ruled out for myocardial infarction with negative cardiac enzymes. EKG at that time showed no ischemic changes. She underwent a nuclear stress test which showed no ischemia. EF was normal at 66%. This was a low risk study. She was also further evaluated with a 48 hour outpatient cardiac monitor. There were no pauses or AV block's. She was given the okay by Dr. Meda Coffee to continue propanolol at a dose of 60 mg daily.  She presents to clinic for routine follow-up. She reports that she has done fairly well since her last office visit. She denies any recurrent chest discomfort. No dyspnea, orthopnea, PND or lower extremity edema. No palpitations, lightheadedness, dizziness, syncope/near-syncope. She reports full medication compliance. Her PCP follows her lipid profile. Her blood pressure in clinic today is elevated at 172/100. She reports that she did take off her medications this morning as prescribed. She reports that she has had a busy/stressful day and was rushing to get to her appointment today. She reports that whenever she checks her blood pressure at home as well as at other office visits, her blood pressure is usually well controlled in the [id number] systolic.  EKG shows NSR. No brachycardia.      Current Outpatient Prescriptions  Medication Sig Dispense Refill  . aspirin 81 MG tablet Take 1 tablet (81 mg total) by mouth daily. 30 tablet   .  atorvastatin (LIPITOR) 10 MG tablet Take 10 mg by mouth every morning.     . Calcium Citrate-Vitamin D (CITRACAL PETITES/VITAMIN D PO) Take 2 tablets by mouth 2 (two) times daily.    . clonazePAM (KLONOPIN) 0.5 MG tablet Take 0.5 mg by mouth 2 (two) times daily as needed for anxiety.     Marland Kitchen FINACEA 15 % cream Apply 1 application topically at bedtime.     . Flaxseed, Linseed, (SM FLAX SEED OIL) 1000 MG CAPS Take 1,000 mg by mouth every morning.     . indapamide (LOZOL) 1.25 MG tablet Take 1.25 mg by mouth every morning.     . Lactobacillus Rhamnosus, GG, (CULTURELLE PO) Take 1 capsule by mouth every morning.     . Multiple Vitamin (MULTIVITAMIN WITH MINERALS) TABS tablet Take 1 tablet by mouth every morning.    . primidone (MYSOLINE) 50 MG tablet Take 3 tablets (150 mg total) by mouth daily with breakfast.    . propranolol ER (INDERAL LA) 60 MG 24 hr capsule Take 1 capsule (60 mg total) by mouth daily. 30 capsule 0  . valsartan (DIOVAN) 320 MG tablet Take 320 mg by mouth every morning.      No current facility-administered medications for this visit.     Allergies  Allergen Reactions  . Latex Itching and Rash  . Flagyl [Metronidazole] Hives and Nausea And Vomiting  . Penicillins Hives and Rash    Social History   Social History  . Marital status: Widowed    Spouse name: N/A  . Number of children: 3  .  Years of education: BA   Occupational History  . Retired Pharmacist, hospital Retired   Social History Main Topics  . Smoking status: Never Smoker  . Smokeless tobacco: Never Used  . Alcohol use No  . Drug use: No  . Sexual activity: No   Other Topics Concern  . Not on file   Social History Narrative   Widowed.  Lives alone.  Ambulates without assistance.   Drinks about 1 cup of green tea a day      Review of Systems: General: negative for chills, fever, night sweats or weight changes.  Cardiovascular: negative for chest pain, dyspnea on exertion, edema, orthopnea, palpitations,  paroxysmal nocturnal dyspnea or shortness of breath Dermatological: negative for rash Respiratory: negative for cough or wheezing Urologic: negative for hematuria Abdominal: negative for nausea, vomiting, diarrhea, bright red blood per rectum, melena, or hematemesis Neurologic: negative for visual changes, syncope, or dizziness All other systems reviewed and are otherwise negative except as noted above.    Last menstrual period 05/25/1999.  General appearance: alert, cooperative and no distress Neck: no carotid bruit and no JVD Lungs: clear to auscultation bilaterally Heart: regular rate and rhythm, S1, S2 normal, no murmur, click, rub or gallop Extremities: no LEE Pulses: 2+ and symmetric Skin: warm and dry Neurologic: Grossly normal  EKG NSR. 86 bpm.   ASSESSMENT AND PLAN:   1. Atypical chest pain: This occurred in November 2016. She ruled out for myocardial infarction. Nuclear stress test was negative for ischemia. Ejection fraction was normal. She denies any further recurrence. Continue medical therapy for primary prevention and treatment of cardiac risk factors. Continue aspirin, Lipitor, propanolol and valsartan.  2. History of second-degree AV block: Resolved with decrease in beta blocker. 48 hour Holter monitor showed no further bradycardia, blocks/pulses. EKG today demonstrates normal sinus rhythm. Heart rate 86 bpm. Continue 60 mg of propanolol daily.  3. Hypertension: blood pressure is elevated in clinic today.  Initial blood pressure was 172/100. Her blood pressure was later rechecked and was 162/96. She reports full medication compliance with valsartan and propanolol. She attributes her high blood pressure readings today to a busy/stressful day.  She notes readings at home, historicaly have been normal. We discussed adding a third agent for hypertension however she wants to keep a close check on this at home. She will monitor her blood pressure twice daily for the next week  and will contact our office with the readings. If she has persistent systolic readings greater than [id number] or diastolic readings greater than 80, we will consider addition of amlodipine. I also recommended that if she continues to have elevated blood pressure readings at home , she can follow-up in our hypertension clinic with our office pharmacist for repeat blood pressure check and medication titration. She is also scheduled to f/u with her PCP next month, who can also manage.   4. HLD: on Lipitor. Followed by PCP.    PLAN  F/u with Dr. Meda Coffee in 6 months.   Lyda Jester PA-C 01/05/2016 1:42 PM

## 2016-01-13 ENCOUNTER — Telehealth: Payer: Self-pay | Admitting: Cardiology

## 2016-01-13 NOTE — Telephone Encounter (Signed)
Notified pt that Dr. Meda Coffee advised that she could take an additional 60 mg of Propranolol at night.  She verbalized understanding and will take if diastolic is above 90.

## 2016-01-13 NOTE — Telephone Encounter (Signed)
She can take additional propranolol 60 mg at night.

## 2016-01-13 NOTE — Telephone Encounter (Signed)
New message  FYI    Pt calling to report BP readings.    8/14 afternoon 137/78 4:15 pm 8/15 10:30 am 123/75, 10pm 141/93 8/16 11am 117/78, 3pm 124/84 8/17 12pm 120/87, 5pm 155/94 8/18 10am 131/90 8/19 no reading was taken 8/20 3:45 pm 125/82, 7:15 pm 130/88 8/21 9am 141/98, 4pm 144/111     Pt stated she woke up w/ a ringing in her ear which is not normal   8/22 7:45 am 167/109

## 2016-01-13 NOTE — Telephone Encounter (Addendum)
Calling w/BP readings.  States she woke up this AM with ringing in her ears.  Has not taken her BP meds this AM.  States she feels fine now.  Denies ringing in ears now, no dizziness or lightheadedness. Advised to take her medication now and wait about an hour and call us back with BP reading.  She verbalizes understanding and will call back.  11:20; calling back w/BP reading She took her BP meds over an hour ago.  128/85.  States she feels good.  No symptoms.  Advised will forward to Dr. Meda Coffee.  Pt wants to know if she should take 2 of the 60 mg of Propranolol when diastolic is above 90.

## 2016-01-15 IMAGING — US US RENAL
1 series · 14 of 25 positions shown · non-contrast
Comparison: 08/03/2013

CLINICAL DATA: Followup renal cysts.

EXAM:
RENAL/URINARY TRACT ULTRASOUND COMPLETE

[Series 1: us renal · 0.23mm/px · 14 of 41 slices shown]
[im 1/41]
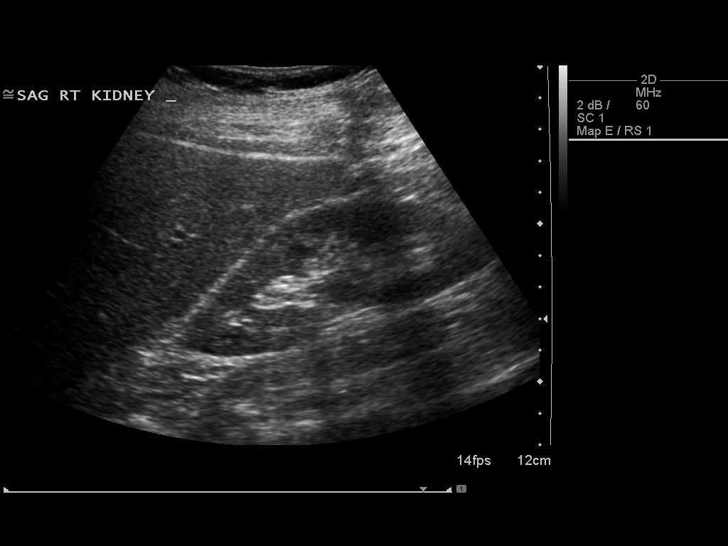
[im 4/41]
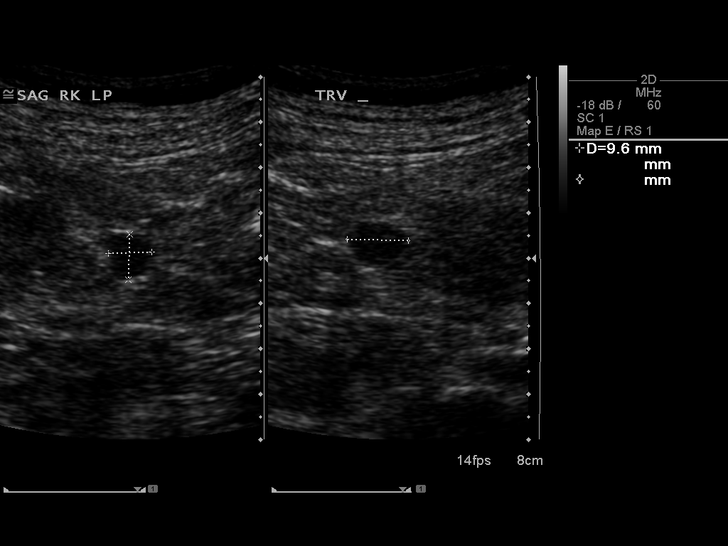
[im 7/41]
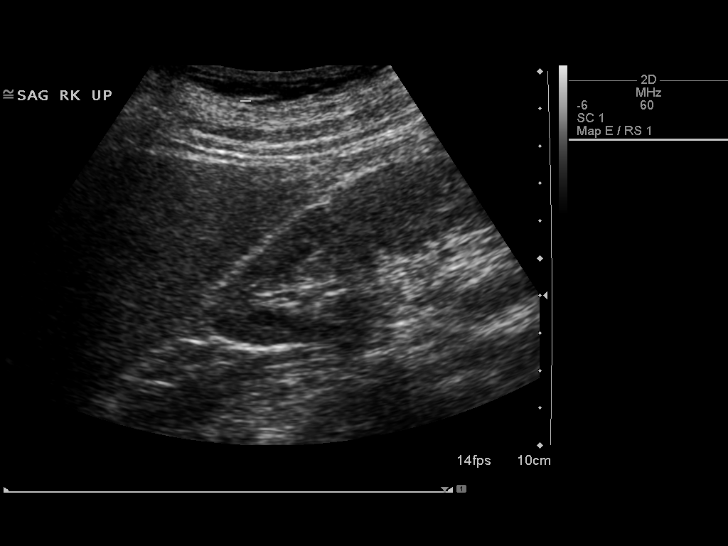
[im 11/41]
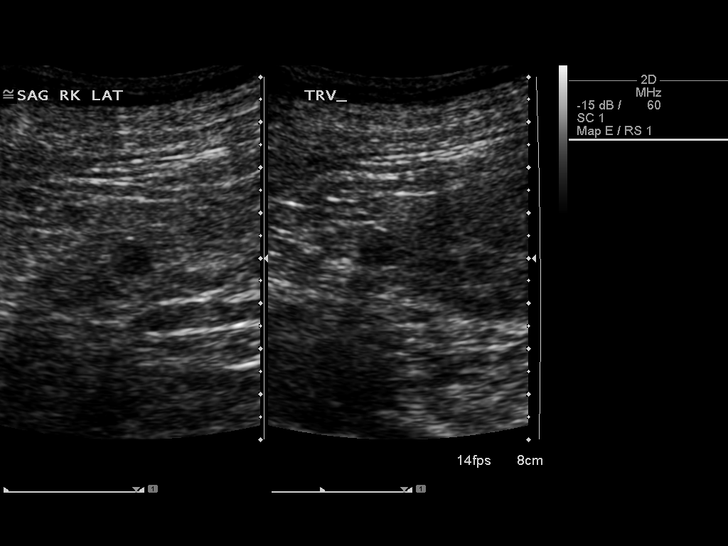
[im 14/41]
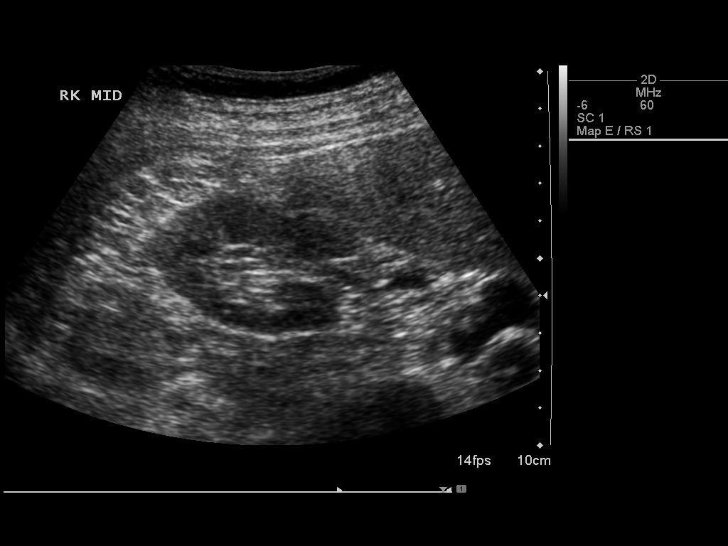
[im 16/41]
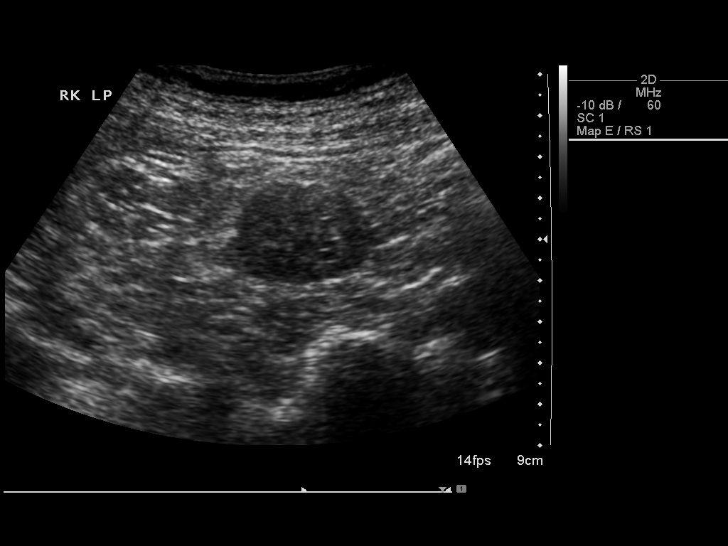
[im 19/41]
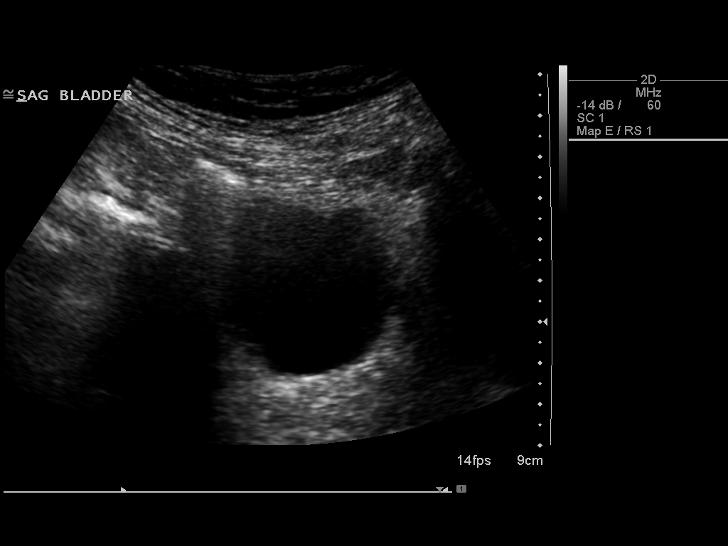
[im 22/41]
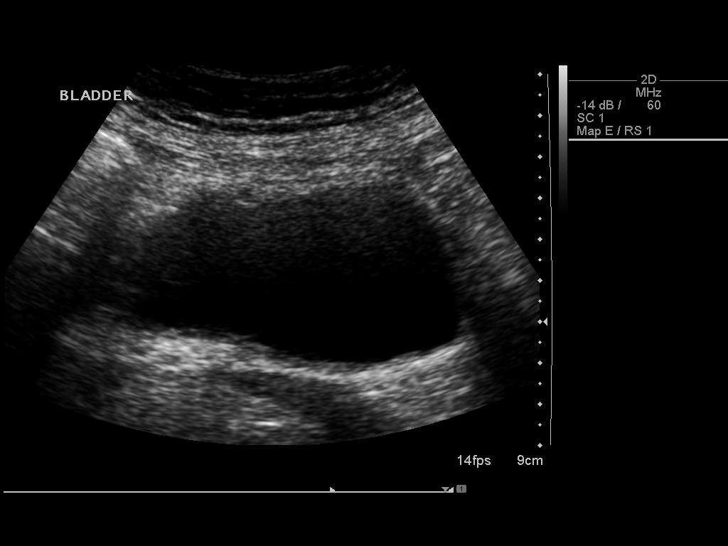
[im 26/41]
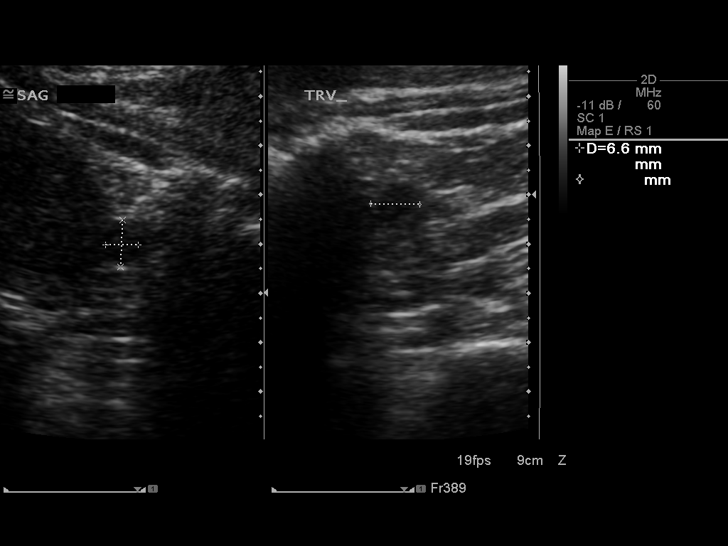
[im 27/41]
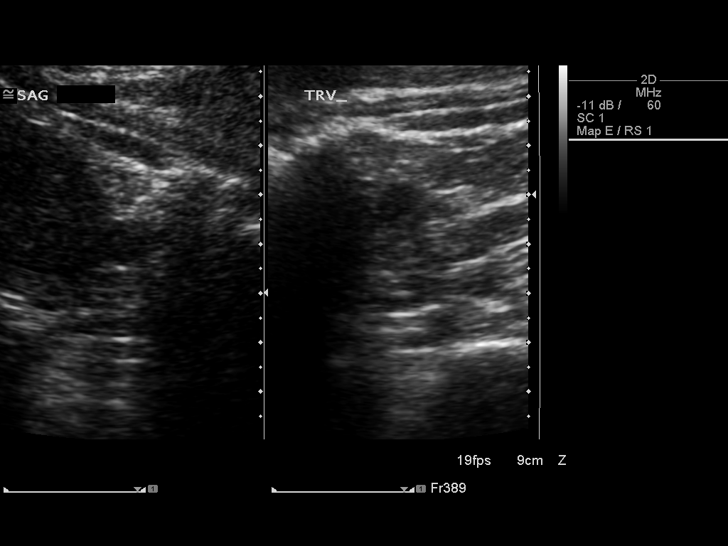
[im 31/41]
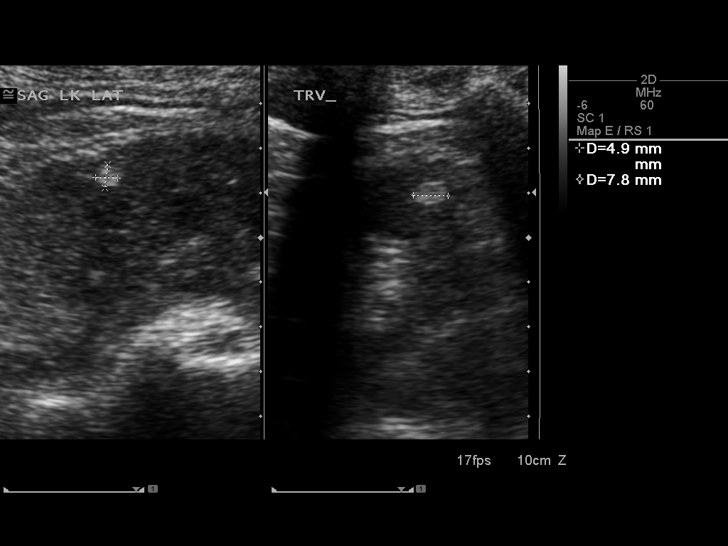
[im 34/41]
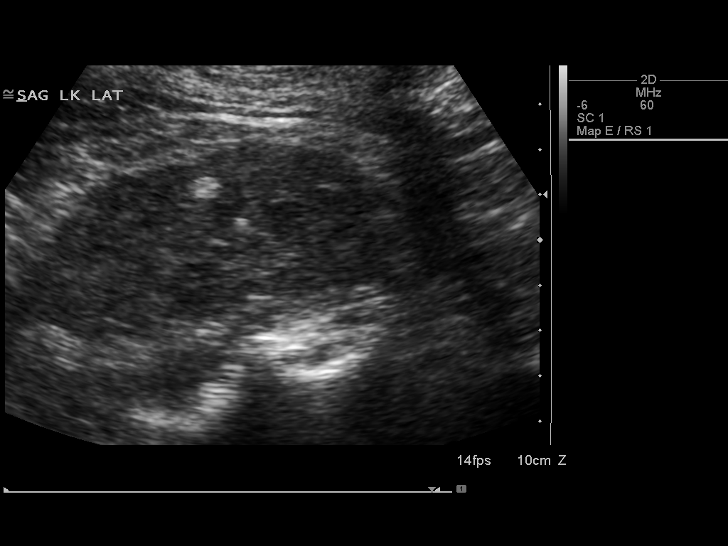
[im 37/41]
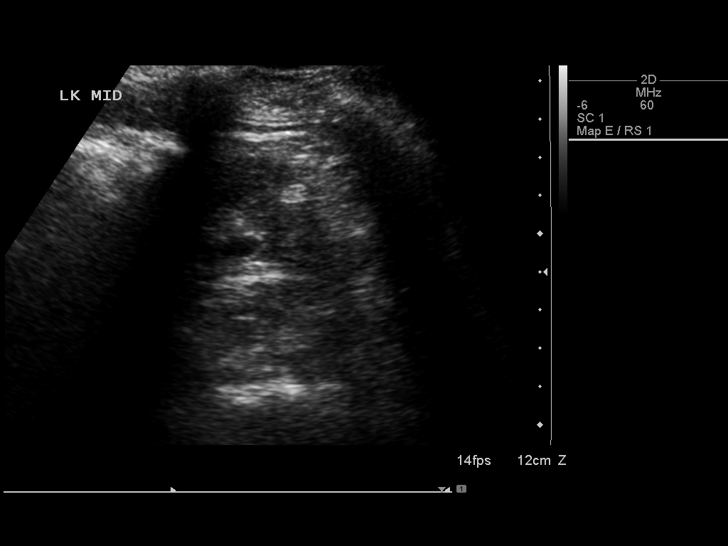
[im 41/41]
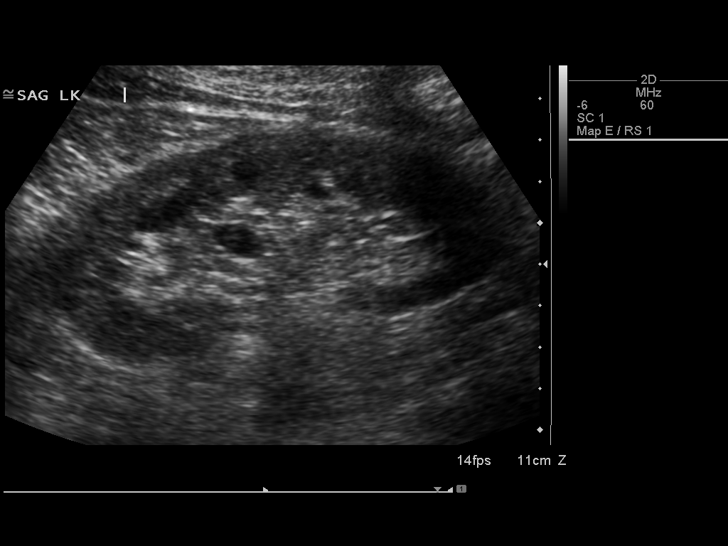

[14 of 25 positions shown; findings below may reference images not displayed]

FINDINGS: Right Kidney:

Length: 10.1 cm. Normal renal cortical thickness and echogenicity
without focal lesions or hydronephrosis. Stable mid and lower pole 1
cm cysts.

Left Kidney:

Length: 11.5 cm. Normal renal cortical thickness and echogenicity
without focal lesions or hydronephrosis. Stable lower pole cyst
measuring 1 cm. There is also a 8 mm hyperechoic lesion consistent
with an angiomyolipoma.

Bladder:

Appears normal for degree of bladder distention.
IMPRESSION: Small bilateral cysts and 8 mm angiomyolipoma on the left.

Normal renal cortical thickness and echogenicity and no
hydronephrosis.

## 2016-02-11 ENCOUNTER — Encounter: Payer: Self-pay | Admitting: Gastroenterology

## 2016-02-11 ENCOUNTER — Encounter (INDEPENDENT_AMBULATORY_CARE_PROVIDER_SITE_OTHER): Payer: Self-pay

## 2016-02-11 ENCOUNTER — Ambulatory Visit (INDEPENDENT_AMBULATORY_CARE_PROVIDER_SITE_OTHER): Payer: Medicare Other | Admitting: Gastroenterology

## 2016-02-11 VITALS — BP 158/92 | HR 84 | Ht 63.75 in | Wt 147.4 lb

## 2016-02-11 DIAGNOSIS — R140 Abdominal distension (gaseous): Secondary | ICD-10-CM

## 2016-02-11 DIAGNOSIS — Z86010 Personal history of colonic polyps: Secondary | ICD-10-CM

## 2016-02-11 DIAGNOSIS — K5900 Constipation, unspecified: Secondary | ICD-10-CM

## 2016-02-11 NOTE — Progress Notes (Signed)
    History of Present Illness: This is a 81 year old female with constipation, gas and bloating. She has a history of tubulovillous adenomatous colon polyp in 2004 and left-sided diverticular disease. She has a history of IBS with constipation. Her last colonoscopy was in June 2011 and showed only left sided diverticulosis and internal hemorrhoids. She was last evaluated by [place] in April 2015 with constipation gas and bloating. She was advised to take MiraLAX daily and a probiotic. Unfortunately she has been only taking MiraLAX when necessary and she has ongoing difficulties with constipation and almost constant bloating and gas..  Current Medications, Allergies, Past Medical History, Past Surgical History, Family History and Social History were reviewed in [employer] record.  Physical Exam: General: Well developed, well nourished, no acute distress Head: Normocephalic and atraumatic Eyes:  sclerae anicteric, EOMI Ears: Normal auditory acuity Mouth: No deformity or lesions Lungs: Clear throughout to auscultation Heart: Regular rate and rhythm; no murmurs, rubs or bruits Abdomen: Soft, non tender and non distended. No masses, hepatosplenomegaly or hernias noted. Normal Bowel sounds Musculoskeletal: Symmetrical with no gross deformities  Pulses:  Normal pulses noted Extremities: No clubbing, cyanosis, edema or deformities noted Neurological: Alert oriented x 4, grossly nonfocal Psychological:  Alert and cooperative. Normal mood and affect  Assessment and Recommendations:  1. Constipation, gas and bloating. Her constipation is under treated. MiraLAX once or twice daily, every day, titrated for adequate bowel movements. If her symptoms do not resolve with this regimen she is advised to call us within the next 3-4 weeks for further recommendations. Consider Linzess, Trulance. Consider further diagnostic evaluation.  2. Personal history of adenomatous colon polyps. Given  that her last colonoscopy was polyp free and she is 81 years old we will discontinue routine surveillance colonoscopies.

## 2016-02-11 NOTE — Patient Instructions (Signed)
Take Miralax 1-2 times a day

## 2016-03-25 ENCOUNTER — Telehealth: Payer: Self-pay | Admitting: Gastroenterology

## 2016-03-25 NOTE — Telephone Encounter (Signed)
Patient notified of the recommendations I mailed her a low gas diet and coupons for IBgard

## 2016-03-25 NOTE — Telephone Encounter (Signed)
Patient reports that she is having regular BM on daily Miralax, but still c/o excess bloating.  Do you have any additional recommendations?

## 2016-03-25 NOTE — Telephone Encounter (Signed)
Try doubling daily dose of Miralax in case she is not completely evacuating. If this leads to diarrhea resume prior daily Miralax dosing and try Gas-X qid, a low gas diet and IBgard 1-2 tid prn 30 min ac or 60 min pc

## 2016-07-08 ENCOUNTER — Encounter (INDEPENDENT_AMBULATORY_CARE_PROVIDER_SITE_OTHER): Payer: Self-pay

## 2016-07-08 ENCOUNTER — Ambulatory Visit (INDEPENDENT_AMBULATORY_CARE_PROVIDER_SITE_OTHER): Payer: Medicare Other | Admitting: Cardiology

## 2016-07-08 ENCOUNTER — Encounter: Payer: Self-pay | Admitting: Cardiology

## 2016-07-08 VITALS — BP 134/90 | HR 76 | Ht 63.75 in | Wt 151.0 lb

## 2016-07-08 DIAGNOSIS — I441 Atrioventricular block, second degree: Secondary | ICD-10-CM | POA: Diagnosis not present

## 2016-07-08 DIAGNOSIS — I10 Essential (primary) hypertension: Secondary | ICD-10-CM | POA: Diagnosis not present

## 2016-07-08 DIAGNOSIS — R072 Precordial pain: Secondary | ICD-10-CM | POA: Diagnosis not present

## 2016-07-08 NOTE — Progress Notes (Signed)
07/08/2016 Michelle Fuller   01-Dec-1934  [id number]  Primary Physician Limmie Patricia, MD Primary Cardiologist: Dr. Meda Coffee  Reason for Visit/CC: f/u for bradycardia and HTN  HPI:  The patient is a 82 year old female with a history of hypertension, hyperlipidemia, breast cancer s/p XRT, diverticulosis  And bradycardia. In November 2016, she was admitted for chest pain. She was also found to be in Mobitz type II second-degree heart block. Her beta blocker was reduced. Propanolol was decreased from 160 mg to 60 mg. She ruled out for myocardial infarction with negative cardiac enzymes. EKG at that time showed no ischemic changes. She underwent a nuclear stress test which showed no ischemia. EF was normal at 66%. This was a low risk study. She was also further evaluated with a 48 hour outpatient cardiac monitor. There were no pauses or AV block's. She was given the okay by Dr. Meda Coffee to continue propanolol at a dose of 60 mg daily.  She presents to clinic for routine follow-up. She reports that she has done fairly well since her last office visit. She denies any recurrent chest discomfort. No dyspnea, orthopnea, PND or lower extremity edema. No palpitations, lightheadedness, dizziness, syncope/near-syncope. She reports full medication compliance. Her PCP follows her lipid profile. Her blood pressure in clinic today is elevated at 172/100. She reports that she did take off her medications this morning as prescribed. She reports that she has had a busy/stressful day and was rushing to get to her appointment today. She reports that whenever she checks her blood pressure at home as well as at other office visits, her blood pressure is usually well controlled in the [id number] systolic.  07/08/2016 - 1 year follow up, the patient feels well and denies chest pain, SOB, LE edema, orthopnea. Her BP has been controlled, no dizziness, syncope or falls. She is tolerating atorvastatin well.    EKG shows NSR. No  brachycardia.      Current Outpatient Prescriptions  Medication Sig Dispense Refill  . aspirin 81 MG tablet Take 1 tablet (81 mg total) by mouth daily. 30 tablet   . atorvastatin (LIPITOR) 10 MG tablet Take 10 mg by mouth every morning.     . Calcium Citrate-Vitamin D (CITRACAL PETITES/VITAMIN D PO) Take 2 tablets by mouth 2 (two) times daily.    . clonazePAM (KLONOPIN) 0.5 MG tablet Take 0.5 mg by mouth 2 (two) times daily as needed for anxiety.     Marland Kitchen FINACEA 15 % cream Apply 1 application topically at bedtime.     . Flaxseed, Linseed, (SM FLAX SEED OIL) 1000 MG CAPS Take 1,000 mg by mouth every morning.     . indapamide (LOZOL) 1.25 MG tablet Take 1.25 mg by mouth every morning.     . Lactobacillus Rhamnosus, GG, (CULTURELLE PO) Take 1 capsule by mouth every morning.     . Multiple Vitamin (MULTIVITAMIN WITH MINERALS) TABS tablet Take 1 tablet by mouth every morning.    . polyethylene glycol powder (GLYCOLAX/MIRALAX) powder Take 17 g by mouth daily as needed.    . primidone (MYSOLINE) 50 MG tablet Take 3 tablets (150 mg total) by mouth daily with breakfast.    . propranolol ER (INDERAL LA) 60 MG 24 hr capsule Take 60 mg by mouth 2 (two) times daily. Takes as needed if diastolic BP greater than 90    . valsartan (DIOVAN) 320 MG tablet Take 320 mg by mouth every morning.      No current facility-administered medications for this  visit.     Allergies  Allergen Reactions  . Clonidine     Other reaction(s): Other (See Comments) Fatigue drymouth  . Hydrocodone-Acetaminophen Nausea Only  . Latex Itching and Rash  . Flagyl [Metronidazole] Hives, Nausea And Vomiting, Nausea Only and Rash  . Penicillin G Rash  . Penicillins Hives and Rash  . Sulfamethoxazole Rash  . Sulfamethoxazole-Trimethoprim Rash    Social History   Social History  . Marital status: Widowed    Spouse name: N/A  . Number of children: 3  . Years of education: BA   Occupational History  . Retired Pharmacist, hospital  Retired   Social History Main Topics  . Smoking status: Never Smoker  . Smokeless tobacco: Never Used  . Alcohol use No  . Drug use: No  . Sexual activity: No   Other Topics Concern  . Not on file   Social History Narrative   Widowed.  Lives alone.  Ambulates without assistance.   Drinks about 1 cup of green tea a day      Review of Systems: General: negative for chills, fever, night sweats or weight changes.  Cardiovascular: negative for chest pain, dyspnea on exertion, edema, orthopnea, palpitations, paroxysmal nocturnal dyspnea or shortness of breath Dermatological: negative for rash Respiratory: negative for cough or wheezing Urologic: negative for hematuria Abdominal: negative for nausea, vomiting, diarrhea, bright red blood per rectum, melena, or hematemesis Neurologic: negative for visual changes, syncope, or dizziness All other systems reviewed and are otherwise negative except as noted above.  Blood pressure 134/90, pulse 76, height 5' 3.75" (1.619 m), weight 151 lb (68.5 kg), last menstrual period 05/25/1999.  General appearance: alert, cooperative and no distress Neck: no carotid bruit and no JVD Lungs: clear to auscultation bilaterally Heart: regular rate and rhythm, S1, S2 normal, no murmur, click, rub or gallop Extremities: no LEE Pulses: 2+ and symmetric Skin: warm and dry Neurologic: Grossly normal  EKG NSR. 86 bpm.     ASSESSMENT AND PLAN:   1. Atypical chest pain:  in November 2016. She ruled out for myocardial infarction. Nuclear stress test was negative for ischemia. Ejection fraction was normal. She denies any further recurrence. Continue medical therapy for primary prevention and treatment of cardiac risk factors. Continue aspirin, Lipitor, propanolol and valsartan. No further testing needed.  2. History of second-degree AV block: Resolved with decrease in beta blocker. 48 hour Holter monitor showed no further bradycardia, blocks/pulses. She is  asymptomatic.  3. Hypertension: controlled on the current regimen.   4. HLD: on Lipitor. Tolerated well.3  PLAN  F/u as needed per patient's wishes.  Ena Dawley MD 07/08/2016 11:43 AM

## 2016-07-08 NOTE — Patient Instructions (Signed)
Medication Instructions:   Your physician recommends that you continue on your current medications as directed. Please refer to the Current Medication list given to you today.    Follow-Up:  FOLLOW-UP AS NEEDED WITH DR Meda Coffee       If you need a refill on your cardiac medications before your next appointment, please call your pharmacy.

## 2016-08-10 DIAGNOSIS — E782 Mixed hyperlipidemia: Secondary | ICD-10-CM | POA: Insufficient documentation

## 2016-08-10 DIAGNOSIS — E559 Vitamin D deficiency, unspecified: Secondary | ICD-10-CM | POA: Insufficient documentation

## 2016-08-10 DIAGNOSIS — E042 Nontoxic multinodular goiter: Secondary | ICD-10-CM | POA: Insufficient documentation

## 2016-08-30 IMAGING — CR DG CHEST 2V
2 series · 2 of 2 positions shown · non-contrast
Comparison: 05/30/2010

CLINICAL DATA: Shortness of breath and fatigue 3-4 days with mid
chest pain radiating to left arm today.

EXAM:
CHEST  2 VIEW

[w chest pa]
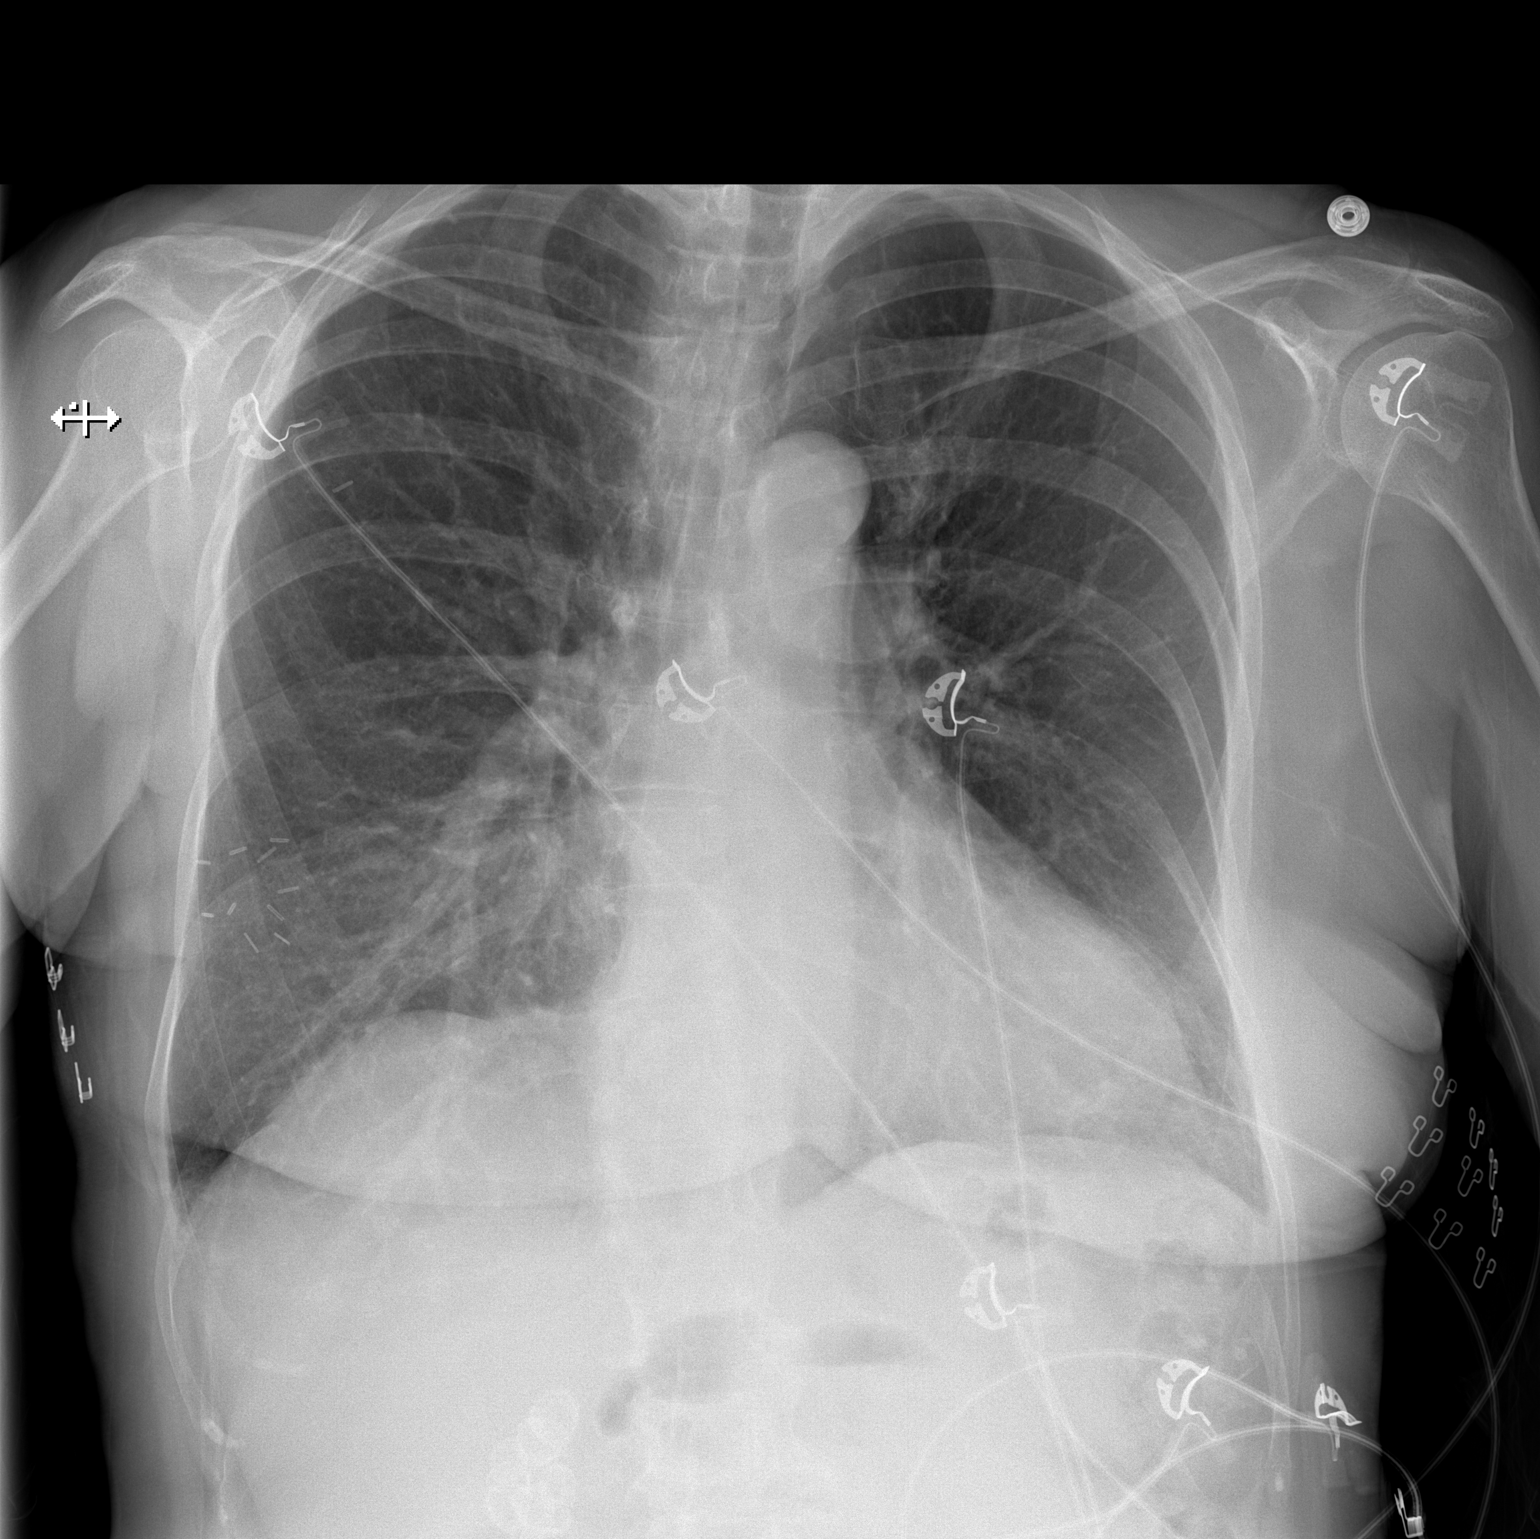

[w chest lat]
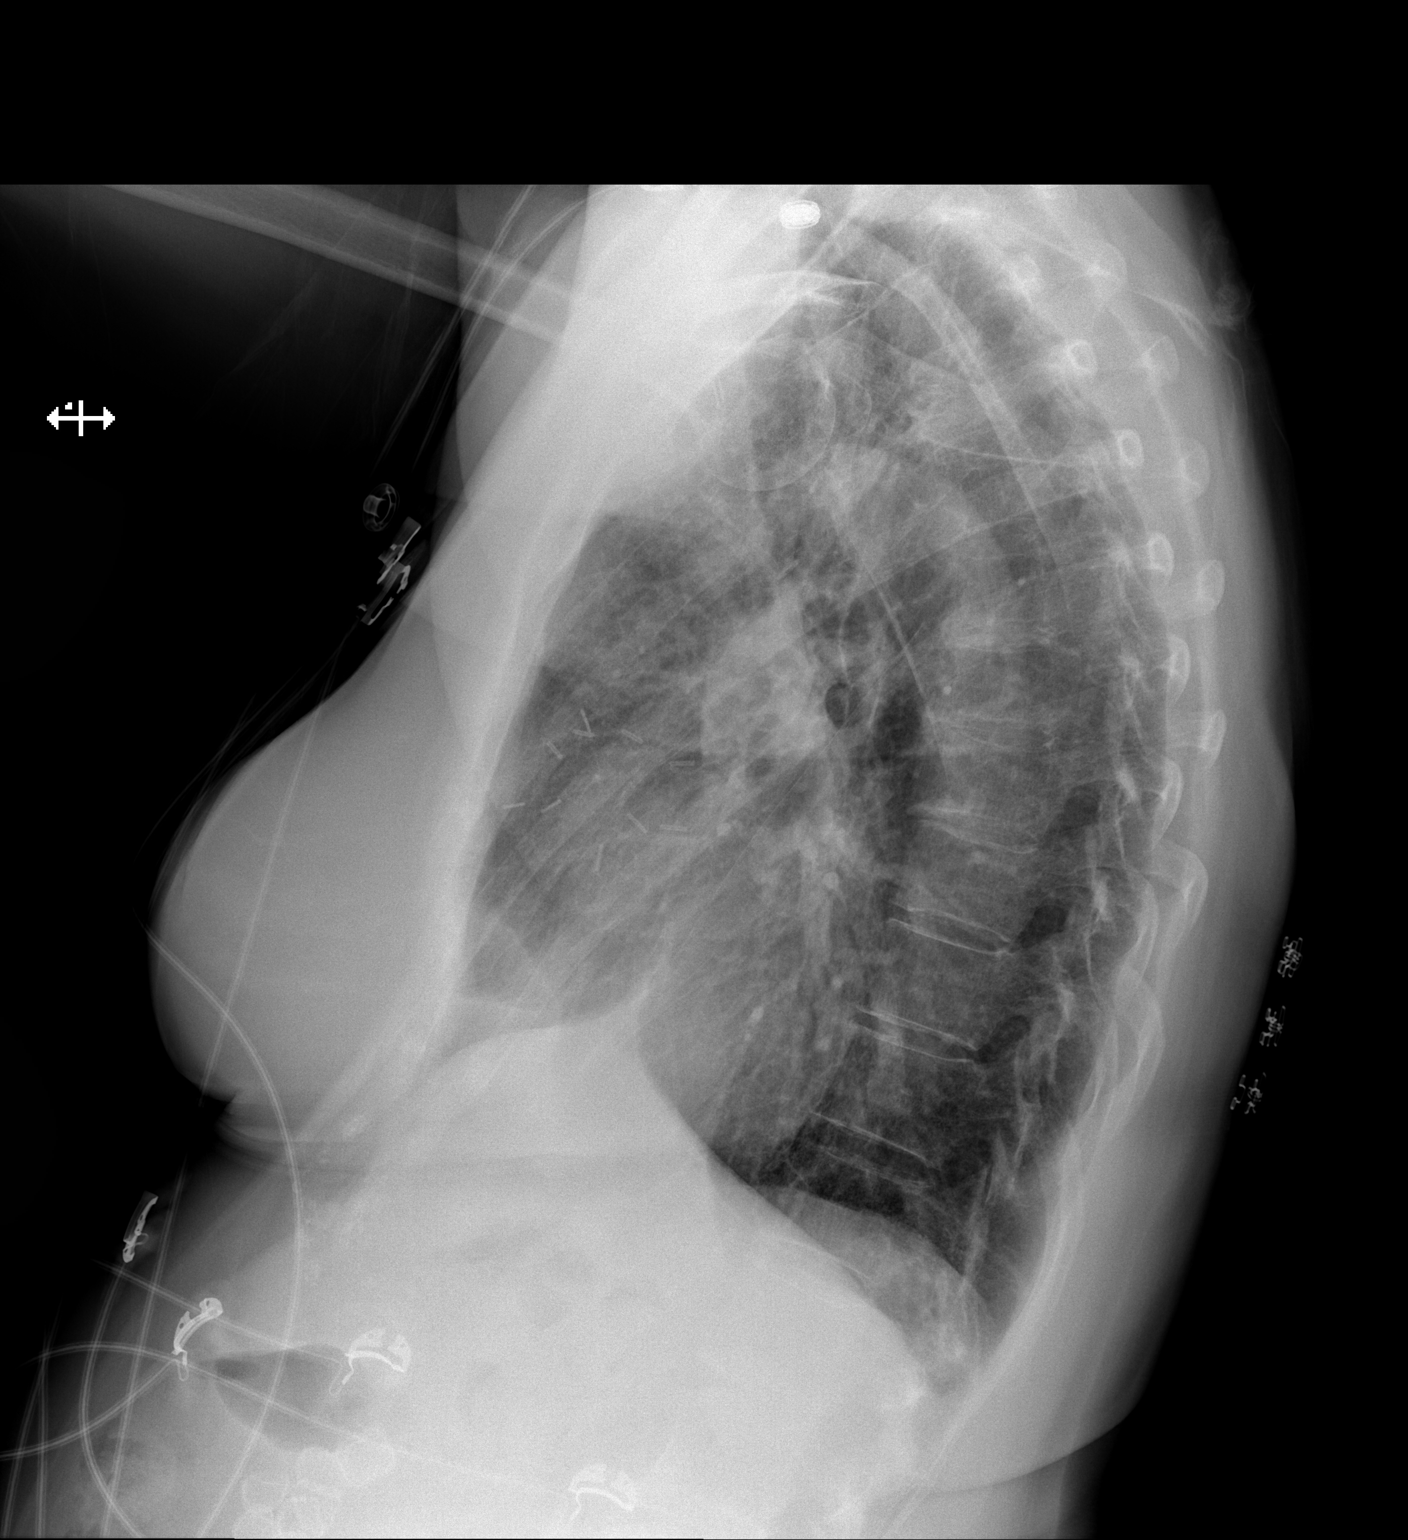

[2 of 2 positions shown; findings below may reference images not displayed]

FINDINGS: Patient slightly rotated to the left. Lungs are adequately inflated
without consolidation or effusion. Borderline stable cardiomegaly.
Postsurgical change of the right breast and axilla. There are mild
degenerative changes of the spine.
IMPRESSION: No active cardiopulmonary disease.

## 2016-09-23 ENCOUNTER — Encounter: Payer: Self-pay | Admitting: Obstetrics & Gynecology

## 2017-03-08 ENCOUNTER — Encounter: Payer: Self-pay | Admitting: Obstetrics & Gynecology

## 2017-03-08 ENCOUNTER — Ambulatory Visit (INDEPENDENT_AMBULATORY_CARE_PROVIDER_SITE_OTHER): Payer: Medicare Other | Admitting: Obstetrics & Gynecology

## 2017-03-08 VITALS — BP 122/84 | HR 64 | Resp 12 | Ht 64.0 in | Wt 145.5 lb

## 2017-03-08 DIAGNOSIS — Z01419 Encounter for gynecological examination (general) (routine) without abnormal findings: Secondary | ICD-10-CM

## 2017-03-08 DIAGNOSIS — Z0000 Encounter for general adult medical examination without abnormal findings: Secondary | ICD-10-CM | POA: Diagnosis not present

## 2017-03-08 LAB — POCT URINALYSIS DIPSTICK
Bilirubin, UA: NEGATIVE
Blood, UA: NEGATIVE
Glucose, UA: NEGATIVE
Ketones, UA: NEGATIVE
LEUKOCYTES UA: NEGATIVE
NITRITE UA: NEGATIVE
PH UA: 5 (ref 5.0–8.0)
PROTEIN UA: NEGATIVE
Urobilinogen, UA: 0.2 E.U./dL

## 2017-03-08 NOTE — Progress Notes (Signed)
82 y.o. G3P3 Widowed Caucasian F here for annual exam.  Reports had issues with diarrhea with the second shingles vaccine.  Felt faint and ended up laying on the floor in her house until she felt better.  She did not pass out with this.  This has all resolved.  Denies vaginal bleeding.  Patient's last menstrual period was 05/25/1999.          Sexually active: No.  The current method of family planning is post menopausal status.    Exercising: Yes.    tai chi, stationary bike Smoker:  no  Health Maintenance: Pap:  09/10/14 negative, 01/25/08 WNL  History of abnormal Pap:  yes MMG:  09/03/16 BIRADS 2 benign  Colonoscopy:  10/31/09, repeat 5 years-  Patient states no longer needed  BMD:   08/28/14 osteoporosis in wrist  TDaP:  03/26/14  Pneumonia vaccine(s):  03/20/14  Shingrix:  Completed this year at [facility] Hep C testing: not indicated  Screening Labs: PCP, Hb today: PCP   reports that she has never smoked. She has never used smokeless tobacco. She reports that she does not drink alcohol or use drugs.  Past Medical History:  Diagnosis Date  . Breast cancer (HCC)    Right breast, status post XRT  . Cataracts, both eyes   . Chest pain 2015   recent episode while in sun- recovered in shade with water  . Colon polyp   . Depression   . Diverticulitis   . Diverticulosis   . Elevated LDL cholesterol level   . Essential tremor   . Goiter    multinodular  . Hyperlipidemia   . Hypertension   . Osteopenia 8/97   Spine  . Ovarian cyst    found on CT scan  . Post-menopausal bleeding 01/2000   Huge polyps exc  . Syncope   . Vitamin D deficiency     Past Surgical History:  Procedure Laterality Date  . Bilateral cateract surgery  2014  . BREAST LUMPECTOMY Right 4/96  . HYSTEROSCOPY  0/01  . LAPAROSCOPIC BILATERAL SALPINGO OOPHERECTOMY Bilateral 01/07/2014   Procedure: LAPAROSCOPIC BILATERAL SALPINGO OOPHORECTOMY;  Surgeon: Annamaria Boots, MD;  Location: [facility];  Service: Gynecology;   Laterality: Bilateral;    Current Outpatient Prescriptions  Medication Sig Dispense Refill  . ACETAMINOPHEN PO Take as directed as needed for back pain    . aspirin 81 MG tablet Take 1 tablet (81 mg total) by mouth daily. 30 tablet   . atorvastatin (LIPITOR) 10 MG tablet Take 10 mg by mouth every morning.     . Calcium Citrate-Vitamin D (CITRACAL PETITES/VITAMIN D PO) Take 2 tablets by mouth 2 (two) times daily.    . ciprofloxacin (CIPRO) 250 MG tablet Take 1 tablet by mouth twice a day for 3 days for UTI    . clonazePAM (KLONOPIN) 0.5 MG tablet Take 0.5 mg by mouth 2 (two) times daily as needed for anxiety.     Marland Kitchen FINACEA 15 % cream Apply 1 application topically at bedtime.     . Flaxseed, Linseed, (SM FLAX SEED OIL) 1000 MG CAPS Take 1,000 mg by mouth every morning.     Marland Kitchen ibuprofen (ADVIL,MOTRIN) 200 MG tablet Take 2 tablets 3 times a day as needed for knee pain    . indapamide (LOZOL) 1.25 MG tablet Take 1.25 mg by mouth every morning.     . Lactobacillus Rhamnosus, GG, (CULTURELLE PO) Take 1 capsule by mouth every morning.     Marland Kitchen  Multiple Vitamin (MULTIVITAMIN WITH MINERALS) TABS tablet Take 1 tablet by mouth every morning.    Marland Kitchen omeprazole (PRILOSEC) 20 MG capsule Take 1 capsule daily for 2 weeks every 4 months    . polyethylene glycol powder (GLYCOLAX/MIRALAX) powder Take 17 g by mouth daily as needed.    . primidone (MYSOLINE) 50 MG tablet Take 3 tablets (150 mg total) by mouth daily with breakfast.    . propranolol ER (INDERAL LA) 60 MG 24 hr capsule Take 60 mg by mouth 2 (two) times daily. Takes as needed if diastolic BP greater than 90    . valsartan (DIOVAN) 320 MG tablet Take 320 mg by mouth every morning.      No current facility-administered medications for this visit.     Family History  Problem Relation Age of Onset  . Heart disease Mother        Died at 25 of MI - says doctors thought it was due to mistreatment of thyroid dz in youth  . Thyroid disease Mother   . Heart  attack Mother   . Alzheimer's disease Father   . Thyroid disease Maternal Grandmother   . Stroke Maternal Grandmother     ROS:  Pertinent items are noted in HPI.  Otherwise, a comprehensive ROS was negative.  Exam:   BP 122/84 (BP Location: Left Arm, Patient Position: Sitting, Cuff Size: Normal)   Pulse 64   Resp 12   Ht '5\' 4"'$  (1.626 m)   Wt 145 lb 8 oz (66 kg)   LMP 05/25/1999   BMI 24.98 kg/m   Weight change: -5#  Height: '5\' 4"'$  (162.6 cm)  Ht Readings from Last 3 Encounters:  03/08/17 '5\' 4"'$  (1.626 m)  07/08/16 5' 3.75" (1.619 m)  02/11/16 5' 3.75" (1.619 m)    General appearance: alert, cooperative and appears stated age Head: Normocephalic, without obvious abnormality, atraumatic Neck: no adenopathy, supple, symmetrical, trachea midline and thyroid enlarged  Lungs: clear to auscultation bilaterally Breasts: normal appearance, no masses or tenderness Heart: regular rate and rhythm Abdomen: soft, non-tender; bowel sounds normal; no masses,  no organomegaly Extremities: extremities normal, atraumatic, no cyanosis or edema Skin: Skin color, texture, turgor normal. No rashes or lesions Lymph nodes: Cervical, supraclavicular, and axillary nodes normal. No abnormal inguinal nodes palpated Neurologic: Grossly normal   Pelvic: External genitalia:  no lesions              Urethra:  normal appearing urethra with no masses, tenderness or lesions              Bartholins and Skenes: normal                 Vagina: normal appearing vagina with normal color and discharge, no lesions              Cervix: no lesions              Pap taken: No. Bimanual Exam:  Uterus:  normal size, contour, position, consistency, mobility, non-tender              Adnexa: normal adnexa and no mass, fullness, tenderness               Rectovaginal: Confirms               Anus:  normal sphincter tone, no lesions  Chaperone was present for exam.  A:  Well Woman with normal exam PMP, no HRT Recurrent  UTIs H/O multinodular goiter. Followed by  Dr. Elyse Hsu. H/O breast cancer 4/96 Elevated lipids Hypertension Osteopenia.  Has declined additional treatment.   Benign essential tremor on Propranolol  H/O ovarian cyst, s/o laparoscopic BSO 8/15  P:   Mammogram guidelines reviewed.  Pt desires to continue MMG pap smear not obtained Pt is going to plan to follow up every two years but knows to call with any new issues/problems

## 2017-03-27 ENCOUNTER — Emergency Department (HOSPITAL_COMMUNITY): Payer: Medicare Other

## 2017-03-27 ENCOUNTER — Encounter (HOSPITAL_COMMUNITY): Payer: Self-pay | Admitting: Emergency Medicine

## 2017-03-27 ENCOUNTER — Observation Stay (HOSPITAL_COMMUNITY)
Admission: EM | Admit: 2017-03-27 | Discharge: 2017-03-29 | Disposition: A | Discharge status: Home / Self Care | Payer: Medicare Other | Attending: Internal Medicine | Admitting: Internal Medicine

## 2017-03-27 DIAGNOSIS — K449 Diaphragmatic hernia without obstruction or gangrene: Secondary | ICD-10-CM | POA: Insufficient documentation

## 2017-03-27 DIAGNOSIS — R197 Diarrhea, unspecified: Secondary | ICD-10-CM

## 2017-03-27 DIAGNOSIS — Z853 Personal history of malignant neoplasm of breast: Secondary | ICD-10-CM | POA: Insufficient documentation

## 2017-03-27 DIAGNOSIS — E785 Hyperlipidemia, unspecified: Secondary | ICD-10-CM | POA: Diagnosis not present

## 2017-03-27 DIAGNOSIS — I700 Atherosclerosis of aorta: Secondary | ICD-10-CM | POA: Diagnosis not present

## 2017-03-27 DIAGNOSIS — K219 Gastro-esophageal reflux disease without esophagitis: Secondary | ICD-10-CM | POA: Insufficient documentation

## 2017-03-27 DIAGNOSIS — I119 Hypertensive heart disease without heart failure: Secondary | ICD-10-CM | POA: Insufficient documentation

## 2017-03-27 DIAGNOSIS — R309 Painful micturition, unspecified: Secondary | ICD-10-CM | POA: Diagnosis not present

## 2017-03-27 DIAGNOSIS — I441 Atrioventricular block, second degree: Secondary | ICD-10-CM | POA: Insufficient documentation

## 2017-03-27 DIAGNOSIS — K5900 Constipation, unspecified: Secondary | ICD-10-CM

## 2017-03-27 DIAGNOSIS — E860 Dehydration: Secondary | ICD-10-CM

## 2017-03-27 DIAGNOSIS — K760 Fatty (change of) liver, not elsewhere classified: Secondary | ICD-10-CM | POA: Insufficient documentation

## 2017-03-27 DIAGNOSIS — M8588 Other specified disorders of bone density and structure, other site: Secondary | ICD-10-CM | POA: Diagnosis not present

## 2017-03-27 DIAGNOSIS — G250 Essential tremor: Secondary | ICD-10-CM | POA: Diagnosis not present

## 2017-03-27 DIAGNOSIS — I444 Left anterior fascicular block: Secondary | ICD-10-CM | POA: Insufficient documentation

## 2017-03-27 DIAGNOSIS — E559 Vitamin D deficiency, unspecified: Secondary | ICD-10-CM | POA: Insufficient documentation

## 2017-03-27 DIAGNOSIS — I10 Essential (primary) hypertension: Secondary | ICD-10-CM | POA: Diagnosis not present

## 2017-03-27 DIAGNOSIS — Z8249 Family history of ischemic heart disease and other diseases of the circulatory system: Secondary | ICD-10-CM | POA: Insufficient documentation

## 2017-03-27 DIAGNOSIS — R112 Nausea with vomiting, unspecified: Secondary | ICD-10-CM

## 2017-03-27 DIAGNOSIS — E876 Hypokalemia: Secondary | ICD-10-CM | POA: Insufficient documentation

## 2017-03-27 DIAGNOSIS — K8020 Calculus of gallbladder without cholecystitis without obstruction: Secondary | ICD-10-CM | POA: Insufficient documentation

## 2017-03-27 DIAGNOSIS — F419 Anxiety disorder, unspecified: Secondary | ICD-10-CM | POA: Insufficient documentation

## 2017-03-27 DIAGNOSIS — R109 Unspecified abdominal pain: Secondary | ICD-10-CM | POA: Diagnosis present

## 2017-03-27 DIAGNOSIS — R531 Weakness: Secondary | ICD-10-CM | POA: Diagnosis not present

## 2017-03-27 DIAGNOSIS — F329 Major depressive disorder, single episode, unspecified: Secondary | ICD-10-CM | POA: Insufficient documentation

## 2017-03-27 DIAGNOSIS — Z888 Allergy status to other drugs, medicaments and biological substances status: Secondary | ICD-10-CM | POA: Insufficient documentation

## 2017-03-27 DIAGNOSIS — K529 Noninfective gastroenteritis and colitis, unspecified: Secondary | ICD-10-CM | POA: Diagnosis not present

## 2017-03-27 DIAGNOSIS — Z882 Allergy status to sulfonamides status: Secondary | ICD-10-CM | POA: Insufficient documentation

## 2017-03-27 DIAGNOSIS — Z7982 Long term (current) use of aspirin: Secondary | ICD-10-CM | POA: Insufficient documentation

## 2017-03-27 DIAGNOSIS — Z79899 Other long term (current) drug therapy: Secondary | ICD-10-CM | POA: Insufficient documentation

## 2017-03-27 DIAGNOSIS — Z923 Personal history of irradiation: Secondary | ICD-10-CM | POA: Diagnosis not present

## 2017-03-27 DIAGNOSIS — Z91040 Latex allergy status: Secondary | ICD-10-CM | POA: Diagnosis not present

## 2017-03-27 DIAGNOSIS — Z8719 Personal history of other diseases of the digestive system: Secondary | ICD-10-CM | POA: Insufficient documentation

## 2017-03-27 DIAGNOSIS — R1084 Generalized abdominal pain: Secondary | ICD-10-CM

## 2017-03-27 DIAGNOSIS — N289 Disorder of kidney and ureter, unspecified: Secondary | ICD-10-CM

## 2017-03-27 DIAGNOSIS — K5730 Diverticulosis of large intestine without perforation or abscess without bleeding: Secondary | ICD-10-CM | POA: Diagnosis not present

## 2017-03-27 DIAGNOSIS — Z880 Allergy status to penicillin: Secondary | ICD-10-CM | POA: Insufficient documentation

## 2017-03-27 DIAGNOSIS — Z885 Allergy status to narcotic agent status: Secondary | ICD-10-CM | POA: Insufficient documentation

## 2017-03-27 DIAGNOSIS — A419 Sepsis, unspecified organism: Secondary | ICD-10-CM | POA: Diagnosis not present

## 2017-03-27 LAB — I-STAT TROPONIN, ED
TROPONIN I, POC: 0 ng/mL (ref 0.00–0.08)
Troponin i, poc: 0 ng/mL (ref 0.00–0.08)

## 2017-03-27 LAB — URINALYSIS, ROUTINE W REFLEX MICROSCOPIC
Bilirubin Urine: NEGATIVE
Glucose, UA: NEGATIVE mg/dL
HGB URINE DIPSTICK: NEGATIVE
Ketones, ur: NEGATIVE mg/dL
Leukocytes, UA: NEGATIVE
Nitrite: NEGATIVE
Protein, ur: NEGATIVE mg/dL
SPECIFIC GRAVITY, URINE: 1.032 — AB (ref 1.005–1.030)
pH: 7 (ref 5.0–8.0)

## 2017-03-27 LAB — CBC
HCT: 45.7 % (ref 36.0–46.0)
HEMOGLOBIN: 15.8 g/dL — AB (ref 12.0–15.0)
MCH: 30.3 pg (ref 26.0–34.0)
MCHC: 34.6 g/dL (ref 30.0–36.0)
MCV: 87.7 fL (ref 78.0–100.0)
Platelets: 229 10*3/uL (ref 150–400)
RBC: 5.21 MIL/uL — AB (ref 3.87–5.11)
RDW: 13.5 % (ref 11.5–15.5)
WBC: 11.2 10*3/uL — ABNORMAL HIGH (ref 4.0–10.5)

## 2017-03-27 LAB — COMPREHENSIVE METABOLIC PANEL
ALBUMIN: 3.9 g/dL (ref 3.5–5.0)
ALT: 20 U/L (ref 14–54)
ANION GAP: 11 (ref 5–15)
AST: 28 U/L (ref 15–41)
Alkaline Phosphatase: 78 U/L (ref 38–126)
BUN: 22 mg/dL — ABNORMAL HIGH (ref 6–20)
CALCIUM: 9.2 mg/dL (ref 8.9–10.3)
CHLORIDE: 95 mmol/L — AB (ref 101–111)
CO2: 27 mmol/L (ref 22–32)
Creatinine, Ser: 0.97 mg/dL (ref 0.44–1.00)
GFR calc non Af Amer: 54 mL/min — ABNORMAL LOW (ref >60)
Glucose, Bld: 143 mg/dL — ABNORMAL HIGH (ref 65–99)
Potassium: 3.6 mmol/L (ref 3.5–5.1)
Sodium: 133 mmol/L — ABNORMAL LOW (ref 135–145)
Total Bilirubin: 0.9 mg/dL (ref 0.3–1.2)
Total Protein: 7.1 g/dL (ref 6.5–8.1)

## 2017-03-27 LAB — I-STAT CG4 LACTIC ACID, ED
LACTIC ACID, VENOUS: 2.22 mmol/L — AB (ref 0.5–1.9)
LACTIC ACID, VENOUS: 3.03 mmol/L — AB (ref 0.5–1.9)

## 2017-03-27 LAB — LIPASE, BLOOD: LIPASE: 33 U/L (ref 11–51)

## 2017-03-27 MED ORDER — ONDANSETRON 4 MG PO TBDP
ORAL_TABLET | ORAL | Status: AC
  Filled 2017-03-27: qty 1

## 2017-03-27 MED ORDER — SODIUM CHLORIDE 0.9 % IV SOLN
1000.0000 mL | INTRAVENOUS | Status: DC
  Administered 2017-03-27: 1000 mL via INTRAVENOUS

## 2017-03-27 MED ORDER — SODIUM CHLORIDE 0.9 % IV BOLUS (SEPSIS)
1000.0000 mL | Freq: Once | INTRAVENOUS | Status: AC
  Administered 2017-03-27: 1000 mL via INTRAVENOUS

## 2017-03-27 MED ORDER — IOPAMIDOL (ISOVUE-300) INJECTION 61%
INTRAVENOUS | Status: AC
  Filled 2017-03-27: qty 100

## 2017-03-27 MED ORDER — ONDANSETRON 4 MG PO TBDP
4.0000 mg | ORAL_TABLET | Freq: Once | ORAL | Status: AC | PRN
  Administered 2017-03-27: 4 mg via ORAL

## 2017-03-27 NOTE — H&P (Addendum)
History and Physical    DELLE ANDRZEJEWSKI KXF:[id number] DOB: 07/18/1934 DOA: 03/27/2017  Referring MD/NP/PA:   PCP: Lorne Skeens, MD   Patient coming from:  The patient is coming from home.  At baseline, pt is independent for most of ADL.   Chief Complaint: Nausea, vomiting, diarrhea and abdominal pain  HPI: Michelle Fuller is a 82 y.o. female with medical history significant of hypertension, hyperlipidemia, GERD, depression, anxiety, essential tremor, remote right breast cancer (lumpectomy and radiation therapy), diverticulitis, second degree AV block, who presents with nausea, vomiting, diarrhea and abdominal pain.  Patient states that her symptoms started in this morning. She vomited twice without blood in the vomitus. She has had 3 watery diarrhea. She has abdominal pain which is located in the mid abdomen, constant, moderate, nonradiating. Patient does not have fever or chills. She feels like she is going to faint. Patient does not have chest pain, shortness rest, cough, symptoms of UTI or unilateral weakness. Of note, patient states that she took ciprofloxacin 2-3 weeks ago.  ED Course: pt was found to have WBC 11.2, lactic acid is 2.03, negative urinalysis, electrolytes renal function okay, temperature normal, tachycardia, tachypnea, ETC. 93% on room air. Patient is admitted to telemetry bed as inpatient.  CT abdomen/pelvis showed 1. Small esophageal hiatal hernia. 2. Mild diffuse fatty infiltration of the liver. 3. Cholelithiasis without evidence of cholecystitis. 4. Subcentimeter lesions in both kidneys. Two right renal lesions demonstrate increased density suggesting hemorrhagic cyst or solid mass. No change since previous study suggests likely benign etiology. 5. Aortic atherosclerosis. 6. Diverticulosis of the colon without evidence of diverticulitis.  Review of Systems:   General: no fevers, chills, no body weight gain, has poor appetite, has fatigue HEENT: no  blurry vision, hearing changes or sore throat Respiratory: no dyspnea, coughing, wheezing CV: no chest pain, no palpitations GI: has nausea, vomiting, abdominal pain, diarrhea, no constipation GU: no dysuria, burning on urination, increased urinary frequency, hematuria  Ext: has trace leg edema Neuro: no unilateral weakness, numbness, or tingling, no vision change or hearing loss Skin: no rash, no skin tear. MSK: No muscle spasm, no deformity, no limitation of range of movement in spin Heme: No easy bruising.  Travel history: No recent long distant travel.  Allergy:  Allergies  Allergen Reactions  . Clonidine Other (See Comments)    Fatigue drymouth  . Hydrocodone-Acetaminophen Nausea Only  . Latex Itching and Rash  . Shingrix [Zoster Vac Recomb Adjuvanted] Other (See Comments)    Chills, nausea, diarrhea, and lightheadedness after second dose  . Flagyl [Metronidazole] Hives, Nausea And Vomiting, Nausea Only and Rash  . Penicillin G Rash  . Penicillins Hives and Rash  . Sulfamethoxazole Rash  . Sulfamethoxazole-Trimethoprim Rash    Past Medical History:  Diagnosis Date  . Breast cancer ([facility])    Right breast, status post XRT  . Cataracts, both eyes   . Chest pain 2015   recent episode while in sun- recovered in shade with water  . Colon polyp   . Depression   . Diverticulitis   . Diverticulosis   . Elevated LDL cholesterol level   . Essential tremor   . Goiter    multinodular  . Hyperlipidemia   . Hypertension   . Osteopenia 8/97   Spine  . Ovarian cyst    found on CT scan  . Post-menopausal bleeding 01/2000   Huge polyps exc  . Syncope   . Vitamin D deficiency     Past  Surgical History:  Procedure Laterality Date  . Bilateral cateract surgery  2014  . BREAST LUMPECTOMY Right 4/96  . HYSTEROSCOPY  0/01    Social History:  reports that  has never smoked. she has never used smokeless tobacco. She reports that she does not drink alcohol or use  drugs.  Family History:  Family History  Problem Relation Age of Onset  . Heart disease Mother        Died at 34 of MI - says doctors thought it was due to mistreatment of thyroid dz in youth  . Thyroid disease Mother   . Heart attack Mother   . Alzheimer's disease Father   . Thyroid disease Maternal Grandmother   . Stroke Maternal Grandmother      Prior to Admission medications   Medication Sig Start Date End Date Taking? Authorizing Provider  ACETAMINOPHEN PO Take as directed as needed for back pain    [provider]  aspirin 81 MG tablet Take 1 tablet (81 mg total) by mouth daily. 01/16/13   Charlynne Cousins, MD  atorvastatin (LIPITOR) 10 MG tablet Take 10 mg by mouth every morning.     [provider]  Calcium Citrate-Vitamin D (CITRACAL PETITES/VITAMIN D PO) Take 2 tablets by mouth 2 (two) times daily.    [provider]  ciprofloxacin (CIPRO) 250 MG tablet Take 1 tablet by mouth twice a day for 3 days for UTI 02/25/17   [provider]  clonazePAM (KLONOPIN) 0.5 MG tablet Take 0.5 mg by mouth 2 (two) times daily as needed for anxiety.  09/09/14   [provider]  FINACEA 15 % cream Apply 1 application topically at bedtime.  12/04/13   [provider]  Flaxseed, Linseed, (SM FLAX SEED OIL) 1000 MG CAPS Take 1,000 mg by mouth every morning.     [provider]  ibuprofen (ADVIL,MOTRIN) 200 MG tablet Take 2 tablets 3 times a day as needed for knee pain    [provider]  indapamide (LOZOL) 1.25 MG tablet Take 1.25 mg by mouth every morning.     [provider]  Lactobacillus Rhamnosus, GG, (CULTURELLE PO) Take 1 capsule by mouth every morning.     [provider]  Multiple Vitamin (MULTIVITAMIN WITH MINERALS) TABS tablet Take 1 tablet by mouth every morning.    [provider]  omeprazole (PRILOSEC) 20 MG capsule Take 1 capsule daily for 2 weeks every 4 months    [provider]  polyethylene glycol powder (GLYCOLAX/MIRALAX) powder Take 17 g by mouth daily as needed.    [provider]  primidone (MYSOLINE) 50 MG tablet Take 3 tablets (150 mg total) by mouth daily with breakfast. 01/08/14   Megan Salon, MD  propranolol ER (INDERAL LA) 60 MG 24 hr capsule Take 60 mg by mouth 2 (two) times daily. Takes as needed if diastolic BP greater than 90    [provider]  valsartan (DIOVAN) 320 MG tablet Take 320 mg by mouth every morning.     [provider]    Physical Exam: Vitals:   03/28/17 0130 03/28/17 0145 03/28/17 0200 03/28/17 0302  BP: 138/75  (!) 138/58 (!) 131/57  Pulse: 91 83 89 89  Resp: 14 18 (!) 22 20  Temp:    98.2 F (36.8 C)  TempSrc:    Oral  SpO2: 97% 97% 95% 96%  Weight:    67.3 kg (148 lb 5.9 oz)  Height:  5\' 4"  (1.626 m)   General: Not in acute distress HEENT:       Eyes: PERRL, EOMI, no scleral icterus.       ENT: No discharge from the ears and nose, no pharynx injection, no tonsillar enlargement.        Neck: No JVD, no bruit, no mass felt. Heme: No neck lymph node enlargement. Cardiac: S1/S2, RRR, No murmurs, No gallops or rubs. Respiratory: No rales, wheezing, rhonchi or rubs. GI: Soft, nondistended, has tenderness in central abdomen, no rebound pain, no organomegaly, BS present. GU: No hematuria Ext: has trace pitting leg edema bilaterally. 2+DP/PT pulse bilaterally. Musculoskeletal: No joint deformities, No joint redness or warmth, no limitation of ROM in spin. Skin: No rashes.  Neuro: Alert, oriented X3, cranial nerves II-XII grossly intact, moves all extremities normally.   Psych: Patient is not psychotic, no suicidal or hemocidal ideation.  Labs on Admission: I have personally reviewed following labs and imaging studies  CBC: Recent Labs  Lab 03/27/17 1722  WBC 11.2*  HGB 15.8*  HCT 45.7  MCV 87.7  PLT 154   Basic Metabolic Panel: Recent Labs  Lab 03/27/17 1722  NA 133*  K 3.6  CL  95*  CO2 27  GLUCOSE 143*  BUN 22*  CREATININE 0.97  CALCIUM 9.2   GFR: Estimated Creatinine Clearance: 43.6 mL/min (by C-G formula based on SCr of 0.97 mg/dL). Liver Function Tests: Recent Labs  Lab 03/27/17 1722  AST 28  ALT 20  ALKPHOS 78  BILITOT 0.9  PROT 7.1  ALBUMIN 3.9   Recent Labs  Lab 03/27/17 1722  LIPASE 33   No results for input(s): AMMONIA in the last 168 hours. Coagulation Profile: No results for input(s): INR, PROTIME in the last 168 hours. Cardiac Enzymes: No results for input(s): CKTOTAL, CKMB, CKMBINDEX, TROPONINI in the last 168 hours. BNP (last 3 results) No results for input(s): PROBNP in the last 8760 hours. HbA1C: No results for input(s): HGBA1C in the last 72 hours. CBG: No results for input(s): GLUCAP in the last 168 hours. Lipid Profile: No results for input(s): CHOL, HDL, LDLCALC, TRIG, CHOLHDL, LDLDIRECT in the last 72 hours. Thyroid Function Tests: No results for input(s): TSH, T4TOTAL, FREET4, T3FREE, THYROIDAB in the last 72 hours. Anemia Panel: No results for input(s): VITAMINB12, FOLATE, FERRITIN, TIBC, IRON, RETICCTPCT in the last 72 hours. Urine analysis:    Component Value Date/Time   COLORURINE YELLOW 03/27/2017 2142   APPEARANCEUR CLEAR 03/27/2017 2142   LABSPEC 1.032 (H) 03/27/2017 2142   PHURINE 7.0 03/27/2017 2142   GLUCOSEU NEGATIVE 03/27/2017 2142   HGBUR NEGATIVE 03/27/2017 2142   BILIRUBINUR NEGATIVE 03/27/2017 2142   BILIRUBINUR N 03/08/2017 1453   KETONESUR NEGATIVE 03/27/2017 2142   PROTEINUR NEGATIVE 03/27/2017 2142   UROBILINOGEN 0.2 03/08/2017 1453   UROBILINOGEN 0.2 04/05/2013 1159   NITRITE NEGATIVE 03/27/2017 2142   LEUKOCYTESUR NEGATIVE 03/27/2017 2142   Sepsis Labs: @LABRCNTIP (procalcitonin:4,lacticidven:4) )No results found for this or any previous visit (from the past 240 hour(s)).   Radiological Exams on Admission: Ct Abdomen Pelvis W Contrast  Result Date: 03/27/2017 CLINICAL DATA:   Nausea, vomiting, and diarrhea. Weakness. Painful urination. EXAM: CT ABDOMEN AND PELVIS WITH CONTRAST TECHNIQUE: Multidetector CT imaging of the abdomen and pelvis was performed using the standard protocol following bolus administration of intravenous contrast. CONTRAST:  100 mL Isovue-300 COMPARISON:  03/30/2013 FINDINGS: Lower chest: Atelectasis or scarring in the lung bases. Small esophageal hiatal hernia. Hepatobiliary: Mild diffuse fatty infiltration  of the liver. No focal liver lesions. Multiple calcified gallstones. No gallbladder wall thickening or edema. No bile duct dilatation. Pancreas: Unremarkable. No pancreatic ductal dilatation or surrounding inflammatory changes. Spleen: Normal in size without focal abnormality. Adrenals/Urinary Tract: No adrenal gland nodules. Focal subcentimeter lesions in both kidneys. Two of the lesions on the right kidney demonstrate increased density. This could indicate hemorrhagic cyst or solid mass. No change since previous study. No hydronephrosis. Mild thickening and irregularity of the bladder wall likely representing cystitis. Stomach/Bowel: Stomach, small bowel, and colon are not abnormally distended. No wall thickening is appreciated. Scattered diverticula in the colon. No evidence of diverticulitis. Appendix is normal. Vascular/Lymphatic: Aortic atherosclerosis. No enlarged abdominal or pelvic lymph nodes. Reproductive: Uterus and bilateral adnexa are unremarkable. Other: No abdominal wall hernia or abnormality. No abdominopelvic ascites. Musculoskeletal: Degenerative changes in the spine. No destructive bone lesions IMPRESSION: 1. Small esophageal hiatal hernia. 2. Mild diffuse fatty infiltration of the liver. 3. Cholelithiasis without evidence of cholecystitis. 4. Subcentimeter lesions in both kidneys. Two right renal lesions demonstrate increased density suggesting hemorrhagic cyst or solid mass. No change since previous study suggests likely benign etiology. 5.  Aortic atherosclerosis. 6. Diverticulosis of the colon without evidence of diverticulitis. Electronically Signed   By: Lucienne Capers M.D.   On: 03/27/2017 21:54     EKG: Independently reviewed. Sinus rhythm, QTC 454, LAD, low voltage.   Assessment/Plan Principal Problem:   Nausea vomiting and diarrhea Active Problems:   Hypertension   Hyperlipidemia   Renal lesion   Abdominal pain   Sepsis (HCC)   Nausea vomiting and diarrhea: Etiology is not clear. Differential diagnosis include viral gastroenteritis and C. difficile colitis given recent use of ciprofloxacin. Patient meets criteria for sepsis with tachycardia, tachypnea and leukocytosis. Lactic acid is elevated. Currently hemodynamically stable.  -Admitted to telemetry bed as inpatient -Since patient is septic, will give one dose of oral vancomycin to cover C. difficile colitis -C. difficile PCR and GI pathogen panel -will get Procalcitonin and trend lactic acid levels per sepsis protocol. -IVF: 2.5L of NS bolus in ED, followed by 125 cc/h  -When necessary Zofran for nausea and morphine for pain  HTN: -hold indapamide since patient needed IV fluid  -IV hydralazine when necessary -Continue Valsartan  HLD: -Lipitor  Renal lesion: CT scan showed subcentimeter lesions in both kidneys. Two right renal lesions demonstrate increased density suggesting hemorrhagic cyst or solid mass. No change since previous study suggests likely benign etiology. -f/u with PCP   DVT ppx: SQ Lovenox Code Status: Full code Family Communication: None at bed side.      Disposition Plan:  Anticipate discharge back to previous home environment Consults called:  none Admission status: Inpatient/tele     Date of Service 03/28/2017    Ivor Costa [facility] Pager [phone]  If 7PM-7AM, please contact night-coverage [email or web address] Password [email or web address] 03/28/2017, 5:05 AM

## 2017-03-27 NOTE — ED Triage Notes (Addendum)
Pt states new onset nausea vomiting diarrhea starting today, she feels weak and cannot keep anything down. Pt states feeling faint at church earlier today and faint when she is having episodes of vomiting and diarrhea. Afebrile.  States she feels like she is going to faint. Vitals all WNL. Denies chest pain or shortness of breath. Does state painful urination.

## 2017-03-27 NOTE — ED Provider Notes (Signed)
[facility] EMERGENCY DEPARTMENT Provider Note   CSN: [id number] Arrival date & time: 03/27/17  1718     History   Chief Complaint Chief Complaint  Patient presents with  . Abdominal Pain  . Emesis  . Diarrhea  . Weakness    HPI Michelle Fuller is a 82 y.o. female.  HPI Michelle Fuller is a 82 y.o. female with hx of diverticulitis presents to ED with complaint of nausea, vomiting, diarrhea, abdominal pain. Pt states symptoms started this morning while at church. States she suddenly became dizzy.  States had to lay down, was unable to get up.  Denies any room sensation spinning.  States she felt like she is going to faint.  She states that she had to lay down for a while and EMS was called.  Patient refused transport to the hospital.  She states she has history of similar symptoms in the past.  She denies losing consciousness completely.  Denies any chest pain or shortness of breath at that time.  No abdominal pain.  She went home, and states shortly after that developed nausea, vomiting, diarrhea.  Reports some abdominal discomfort.  States multiple episodes of emesis, no blood.  Reports 2 episodes of loose stools.  She did not take any medications prior to coming in.  She also admits to urinary frequency, urgency, dysuria for several days.  She states that she has history of UTIs and was treated for one just about a month ago with Cipro.  Denies any fever.  Denies any back pain.  No other complaints at this time.  Past Medical History:  Diagnosis Date  . Breast cancer ([place])    Right breast, status post XRT  . Cataracts, both eyes   . Chest pain 2015   recent episode while in sun- recovered in shade with water  . Colon polyp   . Depression   . Diverticulitis   . Diverticulosis   . Elevated LDL cholesterol level   . Essential tremor   . Goiter    multinodular  . Hyperlipidemia   . Hypertension   . Osteopenia 8/97   Spine  . Ovarian cyst    found on  CT scan  . Post-menopausal bleeding 01/2000   Huge polyps exc  . Syncope   . Vitamin D deficiency     Patient Active Problem List   Diagnosis Date Noted  . Mixed dyslipidemia 08/10/2016  . Multinodular goiter (nontoxic) 08/10/2016  . Vitamin D insufficiency 08/10/2016  . Mobitz type II atrioventricular block 04/21/2015  . Pain in the chest   . Chest pain 04/20/2015  . GERD (gastroesophageal reflux disease) 04/20/2015  . HX: breast cancer 09/10/2013  . Diverticulitis 03/31/2013  . Vasovagal near syncope 03/31/2013  . Visual disturbance, transient 01/16/2013  . Hypertension 01/16/2013  . Hyperlipidemia 01/16/2013  . Cerebrovascular disease 01/16/2013  . Essential tremor 01/16/2013    Past Surgical History:  Procedure Laterality Date  . Bilateral cateract surgery  2014  . BREAST LUMPECTOMY Right 4/96  . HYSTEROSCOPY  0/01    OB History    Gravida Para Term Preterm AB Living   3 3       3    SAB TAB Ectopic Multiple Live Births                   Home Medications    Prior to Admission medications   Medication Sig Start Date End Date Taking? Authorizing Provider  ACETAMINOPHEN PO Take  as directed as needed for back pain    [provider]  aspirin 81 MG tablet Take 1 tablet (81 mg total) by mouth daily. 01/16/13   Charlynne Cousins, MD  atorvastatin (LIPITOR) 10 MG tablet Take 10 mg by mouth every morning.     [provider]  Calcium Citrate-Vitamin D (CITRACAL PETITES/VITAMIN D PO) Take 2 tablets by mouth 2 (two) times daily.    [provider]  ciprofloxacin (CIPRO) 250 MG tablet Take 1 tablet by mouth twice a day for 3 days for UTI 02/25/17   [provider]  clonazePAM (KLONOPIN) 0.5 MG tablet Take 0.5 mg by mouth 2 (two) times daily as needed for anxiety.  09/09/14   [provider]  FINACEA 15 % cream Apply 1 application topically at bedtime.  12/04/13   [provider]  Flaxseed, Linseed, (SM FLAX SEED OIL) 1000  MG CAPS Take 1,000 mg by mouth every morning.     [provider]  ibuprofen (ADVIL,MOTRIN) 200 MG tablet Take 2 tablets 3 times a day as needed for knee pain    [provider]  indapamide (LOZOL) 1.25 MG tablet Take 1.25 mg by mouth every morning.     [provider]  Lactobacillus Rhamnosus, GG, (CULTURELLE PO) Take 1 capsule by mouth every morning.     [provider]  Multiple Vitamin (MULTIVITAMIN WITH MINERALS) TABS tablet Take 1 tablet by mouth every morning.    [provider]  omeprazole (PRILOSEC) 20 MG capsule Take 1 capsule daily for 2 weeks every 4 months    [provider]  polyethylene glycol powder (GLYCOLAX/MIRALAX) powder Take 17 g by mouth daily as needed.    [provider]  primidone (MYSOLINE) 50 MG tablet Take 3 tablets (150 mg total) by mouth daily with breakfast. 01/08/14   Megan Salon, MD  propranolol ER (INDERAL LA) 60 MG 24 hr capsule Take 60 mg by mouth 2 (two) times daily. Takes as needed if diastolic BP greater than 90    [provider]  valsartan (DIOVAN) 320 MG tablet Take 320 mg by mouth every morning.     [provider]    Family History Family History  Problem Relation Age of Onset  . Heart disease Mother        Died at 85 of MI - says doctors thought it was due to mistreatment of thyroid dz in youth  . Thyroid disease Mother   . Heart attack Mother   . Alzheimer's disease Father   . Thyroid disease Maternal Grandmother   . Stroke Maternal Grandmother     Social History Social History   Tobacco Use  . Smoking status: Never Smoker  . Smokeless tobacco: Never Used  Substance Use Topics  . Alcohol use: No    Alcohol/week: 0.0 oz  . Drug use: No     Allergies   Clonidine; Hydrocodone-acetaminophen; Latex; Shingrix [zoster vac recomb adjuvanted]; Flagyl [metronidazole]; Penicillin g; Penicillins; Sulfamethoxazole; and Sulfamethoxazole-trimethoprim   Review of  Systems Review of Systems  Constitutional: Positive for fatigue. Negative for chills and fever.  Respiratory: Negative for cough, chest tightness and shortness of breath.   Cardiovascular: Negative for chest pain, palpitations and leg swelling.  Gastrointestinal: Positive for abdominal pain, diarrhea, nausea and vomiting.  Genitourinary: Positive for dysuria, frequency and urgency. Negative for difficulty urinating, flank pain, pelvic pain, vaginal bleeding, vaginal discharge and vaginal pain.  Musculoskeletal: Negative for arthralgias, myalgias, neck pain and  neck stiffness.  Skin: Negative for rash.  Neurological: Positive for dizziness, weakness and light-headedness. Negative for headaches.  All other systems reviewed and are negative.    Physical Exam Updated Vital Signs BP 128/61 (BP Location: Right Arm)   Pulse 92   Temp 98.4 F (36.9 C) (Oral)   Resp 16   Ht 5\' 4"  (1.626 m)   Wt 65.8 kg (145 lb)   LMP 05/25/1999   SpO2 96%   BMI 24.89 kg/m   Physical Exam  Constitutional: She appears well-developed and well-nourished. No distress.  HENT:  Head: Normocephalic.  Oral mucosa is dry  Eyes: Conjunctivae are normal.  Neck: Neck supple.  Cardiovascular: Normal rate, regular rhythm and normal heart sounds.  Pulmonary/Chest: Effort normal and breath sounds normal. No respiratory distress. She has no wheezes. She has no rales.  Abdominal: Soft. She exhibits no distension. Bowel sounds are increased. There is no tenderness. There is no rigidity, no rebound and no guarding.  Musculoskeletal: She exhibits no edema.  Neurological: She is alert.  Skin: Skin is warm and dry.  Psychiatric: She has a normal mood and affect. Her behavior is normal.  Nursing note and vitals reviewed.    ED Treatments / Results  Labs (all labs ordered are listed, but only abnormal results are displayed) Labs Reviewed  COMPREHENSIVE METABOLIC PANEL - Abnormal; Notable for the following components:        Result Value   Sodium 133 (*)    Chloride 95 (*)    Glucose, Bld 143 (*)    BUN 22 (*)    GFR calc non Af Amer 54 (*)    All other components within normal limits  CBC - Abnormal; Notable for the following components:   WBC 11.2 (*)    RBC 5.21 (*)    Hemoglobin 15.8 (*)    All other components within normal limits  URINALYSIS, ROUTINE W REFLEX MICROSCOPIC - Abnormal; Notable for the following components:   Specific Gravity, Urine 1.032 (*)    All other components within normal limits  I-STAT CG4 LACTIC ACID, ED - Abnormal; Notable for the following components:   Lactic Acid, Venous 2.22 (*)    All other components within normal limits  I-STAT CG4 LACTIC ACID, ED - Abnormal; Notable for the following components:   Lactic Acid, Venous 3.03 (*)    All other components within normal limits  LIPASE, BLOOD  I-STAT TROPONIN, ED  I-STAT TROPONIN, ED    EKG  EKG Interpretation  Date/Time:  Sunday March 27 2017 20:24:40 EST Ventricular Rate:  92 PR Interval:    QRS Duration: 85 QT Interval:  367 QTC Calculation: 454 R Axis:   -49 Text Interpretation:  Sinus rhythm Probable left atrial enlargement Left anterior fascicular block Left ventricular hypertrophy Anterior Q waves, possibly due to LVH no significant change since Nov 2016 Confirmed by Sherwood Gambler [phone]) on 03/27/2017 8:32:02 PM       Radiology Ct Abdomen Pelvis W Contrast  Result Date: 03/27/2017 CLINICAL DATA:  Nausea, vomiting, and diarrhea. Weakness. Painful urination. EXAM: CT ABDOMEN AND PELVIS WITH CONTRAST TECHNIQUE: Multidetector CT imaging of the abdomen and pelvis was performed using the standard protocol following bolus administration of intravenous contrast. CONTRAST:  100 mL Isovue-300 COMPARISON:  03/30/2013 FINDINGS: Lower chest: Atelectasis or scarring in the lung bases. Small esophageal hiatal hernia. Hepatobiliary: Mild diffuse fatty infiltration of the liver. No focal liver lesions. Multiple  calcified gallstones. No gallbladder wall thickening or edema. No  bile duct dilatation. Pancreas: Unremarkable. No pancreatic ductal dilatation or surrounding inflammatory changes. Spleen: Normal in size without focal abnormality. Adrenals/Urinary Tract: No adrenal gland nodules. Focal subcentimeter lesions in both kidneys. Two of the lesions on the right kidney demonstrate increased density. This could indicate hemorrhagic cyst or solid mass. No change since previous study. No hydronephrosis. Mild thickening and irregularity of the bladder wall likely representing cystitis. Stomach/Bowel: Stomach, small bowel, and colon are not abnormally distended. No wall thickening is appreciated. Scattered diverticula in the colon. No evidence of diverticulitis. Appendix is normal. Vascular/Lymphatic: Aortic atherosclerosis. No enlarged abdominal or pelvic lymph nodes. Reproductive: Uterus and bilateral adnexa are unremarkable. Other: No abdominal wall hernia or abnormality. No abdominopelvic ascites. Musculoskeletal: Degenerative changes in the spine. No destructive bone lesions IMPRESSION: 1. Small esophageal hiatal hernia. 2. Mild diffuse fatty infiltration of the liver. 3. Cholelithiasis without evidence of cholecystitis. 4. Subcentimeter lesions in both kidneys. Two right renal lesions demonstrate increased density suggesting hemorrhagic cyst or solid mass. No change since previous study suggests likely benign etiology. 5. Aortic atherosclerosis. 6. Diverticulosis of the colon without evidence of diverticulitis. Electronically Signed   By: Lucienne Capers M.D.   On: 03/27/2017 21:54    Procedures Procedures (including critical care time)  Medications Ordered in ED Medications  ondansetron (ZOFRAN-ODT) 4 MG disintegrating tablet (not administered)  sodium chloride 0.9 % bolus 1,000 mL (not administered)    Followed by  0.9 %  sodium chloride infusion (not administered)  ondansetron (ZOFRAN-ODT) disintegrating  tablet 4 mg (4 mg Oral Given 03/27/17 1747)     Initial Impression / Assessment and Plan / ED Course  I have reviewed the triage vital signs and the nursing notes.  Pertinent labs & imaging results that were available during my care of the patient were reviewed by me and considered in my medical decision making (see chart for details).     Patient in emergency department with near syncopal episode earlier church, now with nausea, vomiting, loose stools.  On exam, abdomen is benign, nontender.  Vital signs are normal.  She does have dry oral mucosa, will order fluids.  Will check urinalysis, labs.  Will monitor.  CT negative. Pt mildly tachycardic. Lactic went up from 2.22 to 3.03. Continues to have mild tachycardia. Will admit for hydration.   11:44 PM Spoke with [facility], will admit   Vitals:   03/27/17 2200 03/27/17 2230 03/27/17 2300 03/27/17 2330  BP: (!) 147/69 (!) 142/56 137/87 128/63  Pulse: (!) 102 99 98 91  Resp: 18 (!) 22 19 20   Temp:      TempSrc:      SpO2: 96% 97% 97% 95%  Weight:      Height:         Final Clinical Impressions(s) / ED Diagnoses   Final diagnoses:  Nausea vomiting and diarrhea  Dehydration    New Prescriptions This SmartLink is deprecated. Use AVSMEDLIST instead to display the medication list for a patient.   Jeannett Senior, PA-C 03/27/17 2344    Sherwood Gambler, MD 03/28/17 [phone]

## 2017-03-28 ENCOUNTER — Inpatient Hospital Stay (HOSPITAL_COMMUNITY): Payer: Medicare Other

## 2017-03-28 ENCOUNTER — Other Ambulatory Visit: Payer: Self-pay

## 2017-03-28 ENCOUNTER — Encounter (HOSPITAL_COMMUNITY): Payer: Self-pay | Admitting: *Deleted

## 2017-03-28 DIAGNOSIS — I10 Essential (primary) hypertension: Secondary | ICD-10-CM | POA: Diagnosis not present

## 2017-03-28 DIAGNOSIS — E785 Hyperlipidemia, unspecified: Secondary | ICD-10-CM | POA: Diagnosis not present

## 2017-03-28 DIAGNOSIS — R1084 Generalized abdominal pain: Secondary | ICD-10-CM | POA: Diagnosis not present

## 2017-03-28 DIAGNOSIS — R197 Diarrhea, unspecified: Secondary | ICD-10-CM | POA: Diagnosis not present

## 2017-03-28 DIAGNOSIS — R112 Nausea with vomiting, unspecified: Secondary | ICD-10-CM | POA: Diagnosis not present

## 2017-03-28 LAB — BASIC METABOLIC PANEL
ANION GAP: 9 (ref 5–15)
Anion gap: 5 (ref 5–15)
BUN: 14 mg/dL (ref 6–20)
BUN: 10 mg/dL (ref 6–20)
CHLORIDE: 106 mmol/L (ref 101–111)
CHLORIDE: 104 mmol/L (ref 101–111)
CO2: 21 mmol/L — ABNORMAL LOW (ref 22–32)
CO2: 24 mmol/L (ref 22–32)
Calcium: 8.1 mg/dL — ABNORMAL LOW (ref 8.9–10.3)
Calcium: 7.9 mg/dL — ABNORMAL LOW (ref 8.9–10.3)
Creatinine, Ser: 0.74 mg/dL (ref 0.44–1.00)
Creatinine, Ser: 0.82 mg/dL (ref 0.44–1.00)
GFR calc Af Amer: >60 mL/min (ref >60)
GFR calc Af Amer: >60 mL/min (ref >60)
GFR calc non Af Amer: >60 mL/min (ref >60)
Glucose, Bld: 99 mg/dL (ref 65–99)
Glucose, Bld: 210 mg/dL — ABNORMAL HIGH (ref 65–99)
POTASSIUM: 2.6 mmol/L — AB (ref 3.5–5.1)
POTASSIUM: 3.8 mmol/L (ref 3.5–5.1)
SODIUM: 136 mmol/L (ref 135–145)
SODIUM: 133 mmol/L — AB (ref 135–145)

## 2017-03-28 LAB — CBC
HEMATOCRIT: 40.5 % (ref 36.0–46.0)
HEMOGLOBIN: 13.4 g/dL (ref 12.0–15.0)
MCH: 29.3 pg (ref 26.0–34.0)
MCHC: 33.1 g/dL (ref 30.0–36.0)
MCV: 88.4 fL (ref 78.0–100.0)
Platelets: 197 10*3/uL (ref 150–400)
RBC: 4.58 MIL/uL (ref 3.87–5.11)
RDW: 13.6 % (ref 11.5–15.5)
WBC: 6.4 10*3/uL (ref 4.0–10.5)

## 2017-03-28 LAB — PROCALCITONIN

## 2017-03-28 LAB — LACTIC ACID, PLASMA
Lactic Acid, Venous: 2.3 mmol/L (ref 0.5–1.9)
Lactic Acid, Venous: 0.9 mmol/L (ref 0.5–1.9)

## 2017-03-28 MED ORDER — IBUPROFEN 200 MG PO TABS: 200.0000 mg | ORAL_TABLET | Freq: Three times a day (TID) | ORAL | Status: DC | PRN

## 2017-03-28 MED ORDER — KCL IN DEXTROSE-NACL 20-5-0.45 MEQ/L-%-% IV SOLN
INTRAVENOUS | Status: DC
  Administered 2017-03-28 (×2): via INTRAVENOUS
  Filled 2017-03-28 (×3): qty 1000

## 2017-03-28 MED ORDER — ONDANSETRON 4 MG PO TBDP
4.0000 mg | ORAL_TABLET | Freq: Three times a day (TID) | ORAL | Status: AC | PRN
  Administered 2017-03-28: 4 mg via ORAL
  Filled 2017-03-28: qty 1

## 2017-03-28 MED ORDER — SM FLAX SEED OIL 1000 MG PO CAPS: 1000.0000 mg | ORAL_CAPSULE | Freq: Every morning | ORAL | Status: DC

## 2017-03-28 MED ORDER — ACETAMINOPHEN 650 MG RE SUPP: 650.0000 mg | Freq: Four times a day (QID) | RECTAL | Status: DC | PRN

## 2017-03-28 MED ORDER — IRBESARTAN 300 MG PO TABS
300.0000 mg | ORAL_TABLET | Freq: Every day | ORAL | Status: DC
  Administered 2017-03-28: 300 mg via ORAL
  Filled 2017-03-28: qty 1

## 2017-03-28 MED ORDER — HYDRALAZINE HCL 20 MG/ML IJ SOLN: 5.0000 mg | INTRAMUSCULAR | Status: DC | PRN

## 2017-03-28 MED ORDER — ENOXAPARIN SODIUM 40 MG/0.4ML ~~LOC~~ SOLN
40.0000 mg | SUBCUTANEOUS | Status: DC
  Administered 2017-03-28: 40 mg via SUBCUTANEOUS
  Filled 2017-03-28: qty 0.4

## 2017-03-28 MED ORDER — ZOLPIDEM TARTRATE 5 MG PO TABS: 5.0000 mg | ORAL_TABLET | Freq: Every evening | ORAL | Status: DC | PRN

## 2017-03-28 MED ORDER — PROPRANOLOL HCL ER 60 MG PO CP24
60.0000 mg | ORAL_CAPSULE | Freq: Two times a day (BID) | ORAL | Status: DC
  Administered 2017-03-28 – 2017-03-29 (×3): 60 mg via ORAL
  Filled 2017-03-28 (×3): qty 1

## 2017-03-28 MED ORDER — VANCOMYCIN 50 MG/ML ORAL SOLUTION
125.0000 mg | Freq: Once | ORAL | Status: DC
  Filled 2017-03-28: qty 2.5

## 2017-03-28 MED ORDER — ADULT MULTIVITAMIN W/MINERALS CH
1.0000 | ORAL_TABLET | Freq: Every morning | ORAL | Status: DC
  Administered 2017-03-28 – 2017-03-29 (×2): 1 via ORAL
  Filled 2017-03-28 (×2): qty 1

## 2017-03-28 MED ORDER — POTASSIUM CHLORIDE CRYS ER 20 MEQ PO TBCR
40.0000 meq | EXTENDED_RELEASE_TABLET | Freq: Two times a day (BID) | ORAL | Status: AC
  Administered 2017-03-28 (×2): 40 meq via ORAL
  Filled 2017-03-28 (×2): qty 2

## 2017-03-28 MED ORDER — CLONAZEPAM 0.5 MG PO TABS
0.5000 mg | ORAL_TABLET | Freq: Two times a day (BID) | ORAL | Status: DC | PRN
  Administered 2017-03-28: 0.5 mg via ORAL
  Filled 2017-03-28: qty 1

## 2017-03-28 MED ORDER — ONDANSETRON HCL 4 MG/2ML IJ SOLN
4.0000 mg | Freq: Four times a day (QID) | INTRAMUSCULAR | Status: DC | PRN
  Administered 2017-03-28: 4 mg via INTRAVENOUS
  Filled 2017-03-28: qty 2

## 2017-03-28 MED ORDER — SODIUM CHLORIDE 0.9 % IV SOLN
INTRAVENOUS | Status: DC
  Administered 2017-03-28: 04:00:00 via INTRAVENOUS

## 2017-03-28 MED ORDER — PANTOPRAZOLE SODIUM 40 MG PO TBEC
40.0000 mg | DELAYED_RELEASE_TABLET | Freq: Every day | ORAL | Status: DC
  Administered 2017-03-29: 40 mg via ORAL
  Filled 2017-03-28 (×2): qty 1

## 2017-03-28 MED ORDER — ASPIRIN 81 MG PO CHEW
81.0000 mg | CHEWABLE_TABLET | Freq: Every day | ORAL | Status: DC
  Administered 2017-03-28 – 2017-03-29 (×2): 81 mg via ORAL
  Filled 2017-03-28 (×2): qty 1

## 2017-03-28 MED ORDER — MORPHINE SULFATE (PF) 2 MG/ML IV SOLN: 2.0000 mg | INTRAVENOUS | Status: DC | PRN

## 2017-03-28 MED ORDER — ATORVASTATIN CALCIUM 10 MG PO TABS
10.0000 mg | ORAL_TABLET | Freq: Every morning | ORAL | Status: DC
  Administered 2017-03-28 – 2017-03-29 (×2): 10 mg via ORAL
  Filled 2017-03-28 (×2): qty 1

## 2017-03-28 MED ORDER — AZELAIC ACID 15 % EX GEL: 1.0000 "application " | Freq: Every day | CUTANEOUS | Status: DC

## 2017-03-28 MED ORDER — CALCIUM CARBONATE-VITAMIN D 500-200 MG-UNIT PO TABS
1.0000 | ORAL_TABLET | Freq: Two times a day (BID) | ORAL | Status: DC
  Administered 2017-03-28 – 2017-03-29 (×3): 1 via ORAL
  Filled 2017-03-28 (×3): qty 1

## 2017-03-28 MED ORDER — ACETAMINOPHEN 325 MG PO TABS
650.0000 mg | ORAL_TABLET | Freq: Four times a day (QID) | ORAL | Status: DC | PRN
  Administered 2017-03-28: 650 mg via ORAL
  Filled 2017-03-28: qty 2

## 2017-03-28 MED ORDER — SODIUM CHLORIDE 0.9 % IV BOLUS (SEPSIS)
500.0000 mL | Freq: Once | INTRAVENOUS | Status: AC
  Administered 2017-03-28: 500 mL via INTRAVENOUS

## 2017-03-28 MED ORDER — PRIMIDONE 50 MG PO TABS
150.0000 mg | ORAL_TABLET | Freq: Every day | ORAL | Status: DC
  Administered 2017-03-28 – 2017-03-29 (×2): 150 mg via ORAL
  Filled 2017-03-28 (×2): qty 3

## 2017-03-28 NOTE — Plan of Care (Signed)
Pt reports some nausea but no vomitting today. Reports some improvement with nausea medication

## 2017-03-28 NOTE — Progress Notes (Signed)
PROGRESS NOTE  Michelle Fuller KWI:[id number] DOB: 1934/12/18 DOA: 03/27/2017 PCP: Lorne Skeens, MD  HPI/Recap of past 69 hours: 82 year old female with medical history significant of hypertension, hyperlipidemia, GERD, depression, anxiety, remote right breast cancer (lumpectomy and radiation therapy), diverticulitis presents with nausea, vomiting, diarrhea and abdominal pain for the past 2 days.  Patient reported eating barbecue at an event in church shortly prior to presentation. Patient met sepsis criteria with tachycardia, tachypnea, mild leukocytosis and elevated lactic acid.  Patient was admitted for further management  Today, patient reports feeling slightly better, still complained of nausea, but denied any further vomiting or diarrhea.  Reports mild abdominal discomfort and feeling lethargic.  Patient denies any chest pains, shortness of breath, fever/chills.  Assessment/Plan: Principal Problem:   Nausea vomiting and diarrhea Active Problems:   Hypertension   Hyperlipidemia   Renal lesion   Abdominal pain   Sepsis ([place])  #Acute gastroenteritis Improving Likely viral, rule out C. Difficile (completed a 3 day course of cipro for UTI) Afebrile, no leukocytosis For calcitonin negative, lactic acid trended down Manage symptomatically Continue maintenance IV fluids Zofran for nausea, morphine as needed for pain  #Hypokalemia Likely due to above Replace as needed  #Hypertension Stable Hold BP meds IV hydralazine as needed  #Hyperlipidemia Lipitor  #Renal lesion Subcentimeter lesions in both kidneys Likely benign, not new, no change since previous study PCP follow-up    Code Status: Full  Family Communication: Son and daughter  Disposition Plan: Home once stable   Consultants:  None  Procedures:  None  Antimicrobials:  Status post 1 dose of p.o. Vanco  DVT prophylaxis: Lovenox   Objective: Vitals:   03/28/17 1155 03/28/17 1425  03/28/17 1500 03/28/17 1708  BP: 137/63  (!) 129/58 123/66  Pulse: 92   83  Resp:   16 18  Temp: (!) 101.9 F (38.8 C) 99.3 F (37.4 C) 98.8 F (37.1 C) 98.5 F (36.9 C)  TempSrc: Oral Oral Oral Oral  SpO2:   95% 94%  Weight:      Height:        Intake/Output Summary (Last 24 hours) at 03/28/2017 1754 Last data filed at 03/28/2017 1401 Gross per 24 hour  Intake 2120 ml  Output 650 ml  Net 1470 ml   Filed Weights   03/27/17 1737 03/28/17 0302  Weight: 65.8 kg (145 lb) 67.3 kg (148 lb 5.9 oz)    Exam:   General: Awake, alert, oriented x3  Cardiovascular: S1, S2 present, no added sound  Respiratory: Chest clear bilaterally  Abdomen: Soft, slightly distended, mildly tender, bowel sounds present  Musculoskeletal: Trace bilateral pedal edema  Skin: Normal  Psychiatry: Normal mood   Data Reviewed: CBC: Recent Labs  Lab 03/27/17 1722 03/28/17 0630  WBC 11.2* 6.4  HGB 15.8* 13.4  HCT 45.7 40.5  MCV 87.7 88.4  PLT 229 242   Basic Metabolic Panel: Recent Labs  Lab 03/27/17 1722 03/28/17 0630  NA 133* 136  K 3.6 2.6*  CL 95* 106  CO2 27 21*  GLUCOSE 143* 99  BUN 22* 14  CREATININE 0.97 0.74  CALCIUM 9.2 8.1*   GFR: Estimated Creatinine Clearance: 52.9 mL/min (by C-G formula based on SCr of 0.74 mg/dL). Liver Function Tests: Recent Labs  Lab 03/27/17 1722  AST 28  ALT 20  ALKPHOS 78  BILITOT 0.9  PROT 7.1  ALBUMIN 3.9   Recent Labs  Lab 03/27/17 1722  LIPASE 33   No results for input(s): AMMONIA  in the last 168 hours. Coagulation Profile: No results for input(s): INR, PROTIME in the last 168 hours. Cardiac Enzymes: No results for input(s): CKTOTAL, CKMB, CKMBINDEX, TROPONINI in the last 168 hours. BNP (last 3 results) No results for input(s): PROBNP in the last 8760 hours. HbA1C: No results for input(s): HGBA1C in the last 72 hours. CBG: No results for input(s): GLUCAP in the last 168 hours. Lipid Profile: No results for input(s):  CHOL, HDL, LDLCALC, TRIG, CHOLHDL, LDLDIRECT in the last 72 hours. Thyroid Function Tests: No results for input(s): TSH, T4TOTAL, FREET4, T3FREE, THYROIDAB in the last 72 hours. Anemia Panel: No results for input(s): VITAMINB12, FOLATE, FERRITIN, TIBC, IRON, RETICCTPCT in the last 72 hours. Urine analysis:    Component Value Date/Time   COLORURINE YELLOW 03/27/2017 2142   APPEARANCEUR CLEAR 03/27/2017 2142   LABSPEC 1.032 (H) 03/27/2017 2142   PHURINE 7.0 03/27/2017 2142   GLUCOSEU NEGATIVE 03/27/2017 2142   HGBUR NEGATIVE 03/27/2017 2142   BILIRUBINUR NEGATIVE 03/27/2017 2142   BILIRUBINUR N 03/08/2017 1453   KETONESUR NEGATIVE 03/27/2017 2142   PROTEINUR NEGATIVE 03/27/2017 2142   UROBILINOGEN 0.2 03/08/2017 1453   UROBILINOGEN 0.2 04/05/2013 1159   NITRITE NEGATIVE 03/27/2017 2142   LEUKOCYTESUR NEGATIVE 03/27/2017 2142   Sepsis Labs: '@LABRCNTIP'$ (procalcitonin:4,lacticidven:4)  )No results found for this or any previous visit (from the past 240 hour(s)).    Studies: Ct Abdomen Pelvis W Contrast  Result Date: 03/27/2017 CLINICAL DATA:  Nausea, vomiting, and diarrhea. Weakness. Painful urination. EXAM: CT ABDOMEN AND PELVIS WITH CONTRAST TECHNIQUE: Multidetector CT imaging of the abdomen and pelvis was performed using the standard protocol following bolus administration of intravenous contrast. CONTRAST:  100 mL Isovue-300 COMPARISON:  03/30/2013 FINDINGS: Lower chest: Atelectasis or scarring in the lung bases. Small esophageal hiatal hernia. Hepatobiliary: Mild diffuse fatty infiltration of the liver. No focal liver lesions. Multiple calcified gallstones. No gallbladder wall thickening or edema. No bile duct dilatation. Pancreas: Unremarkable. No pancreatic ductal dilatation or surrounding inflammatory changes. Spleen: Normal in size without focal abnormality. Adrenals/Urinary Tract: No adrenal gland nodules. Focal subcentimeter lesions in both kidneys. Two of the lesions on the  right kidney demonstrate increased density. This could indicate hemorrhagic cyst or solid mass. No change since previous study. No hydronephrosis. Mild thickening and irregularity of the bladder wall likely representing cystitis. Stomach/Bowel: Stomach, small bowel, and colon are not abnormally distended. No wall thickening is appreciated. Scattered diverticula in the colon. No evidence of diverticulitis. Appendix is normal. Vascular/Lymphatic: Aortic atherosclerosis. No enlarged abdominal or pelvic lymph nodes. Reproductive: Uterus and bilateral adnexa are unremarkable. Other: No abdominal wall hernia or abnormality. No abdominopelvic ascites. Musculoskeletal: Degenerative changes in the spine. No destructive bone lesions IMPRESSION: 1. Small esophageal hiatal hernia. 2. Mild diffuse fatty infiltration of the liver. 3. Cholelithiasis without evidence of cholecystitis. 4. Subcentimeter lesions in both kidneys. Two right renal lesions demonstrate increased density suggesting hemorrhagic cyst or solid mass. No change since previous study suggests likely benign etiology. 5. Aortic atherosclerosis. 6. Diverticulosis of the colon without evidence of diverticulitis. Electronically Signed   By: Lucienne Capers M.D.   On: 03/27/2017 21:54    Scheduled Meds: . aspirin  81 mg Oral Daily  . atorvastatin  10 mg Oral q morning - 10a  . calcium-vitamin D  1 tablet Oral BID  . enoxaparin (LOVENOX) injection  40 mg Subcutaneous Q24H  . irbesartan  300 mg Oral Daily  . multivitamin with minerals  1 tablet Oral q morning - 10a  .  pantoprazole  40 mg Oral Daily  . potassium chloride  40 mEq Oral BID  . primidone  150 mg Oral Q breakfast  . propranolol ER  60 mg Oral BID  . vancomycin  125 mg Oral Once    Continuous Infusions: . dextrose 5 % and 0.45 % NaCl with KCl 20 mEq/L 100 mL/hr at 03/28/17 1154     LOS: 0 days     Alma Friendly, MD [facility]  If 7PM-7AM, please contact  night-coverage [email or web address] Password [facility] 03/28/2017, 5:54 PM

## 2017-03-28 NOTE — Progress Notes (Signed)
Arrival Method: Patient arrived in stretcher from ED. Mental Orientation: alert and oriented Telemetry:8 Assessment: See Doc Flow sheets. Skin: Warm, dry and intact. IV: Peripheral I left forearm Pain:.denies Fall Prevention Safety Plan: Patient educated about fall prevention safety plan, understood and acknowledged. Admission [place]  Orientation: Patient has been oriented to the unit, staff and to the room.

## 2017-03-29 DIAGNOSIS — E785 Hyperlipidemia, unspecified: Secondary | ICD-10-CM | POA: Diagnosis not present

## 2017-03-29 DIAGNOSIS — R1084 Generalized abdominal pain: Secondary | ICD-10-CM | POA: Diagnosis not present

## 2017-03-29 DIAGNOSIS — R112 Nausea with vomiting, unspecified: Secondary | ICD-10-CM | POA: Diagnosis not present

## 2017-03-29 DIAGNOSIS — N289 Disorder of kidney and ureter, unspecified: Secondary | ICD-10-CM | POA: Diagnosis not present

## 2017-03-29 DIAGNOSIS — R197 Diarrhea, unspecified: Secondary | ICD-10-CM | POA: Diagnosis not present

## 2017-03-29 DIAGNOSIS — I10 Essential (primary) hypertension: Secondary | ICD-10-CM | POA: Diagnosis not present

## 2017-03-29 LAB — GLUCOSE, CAPILLARY
GLUCOSE-CAPILLARY: 88 mg/dL (ref 65–99)
GLUCOSE-CAPILLARY: 98 mg/dL (ref 65–99)

## 2017-03-29 LAB — CBC WITH DIFFERENTIAL/PLATELET
BASOS PCT: 0 %
Basophils Absolute: 0 10*3/uL (ref 0.0–0.1)
EOS ABS: 0.1 10*3/uL (ref 0.0–0.7)
EOS PCT: 2 %
HCT: 37 % (ref 36.0–46.0)
Hemoglobin: 12.7 g/dL (ref 12.0–15.0)
LYMPHS ABS: 1.2 10*3/uL (ref 0.7–4.0)
Lymphocytes Relative: 18 %
MCH: 30.2 pg (ref 26.0–34.0)
MCHC: 34.3 g/dL (ref 30.0–36.0)
MCV: 88.1 fL (ref 78.0–100.0)
MONOS PCT: 20 %
Monocytes Absolute: 1.3 10*3/uL — ABNORMAL HIGH (ref 0.1–1.0)
NEUTROS PCT: 60 %
Neutro Abs: 4.1 10*3/uL (ref 1.7–7.7)
PLATELETS: 180 10*3/uL (ref 150–400)
RBC: 4.2 MIL/uL (ref 3.87–5.11)
RDW: 13.7 % (ref 11.5–15.5)
WBC: 6.7 10*3/uL (ref 4.0–10.5)

## 2017-03-29 LAB — BASIC METABOLIC PANEL
Anion gap: 4 — ABNORMAL LOW (ref 5–15)
BUN: 6 mg/dL (ref 6–20)
CALCIUM: 8.3 mg/dL — AB (ref 8.9–10.3)
CO2: 24 mmol/L (ref 22–32)
CREATININE: 0.74 mg/dL (ref 0.44–1.00)
Chloride: 106 mmol/L (ref 101–111)
GFR calc non Af Amer: >60 mL/min (ref >60)
GLUCOSE: 96 mg/dL (ref 65–99)
Potassium: 3.5 mmol/L (ref 3.5–5.1)
Sodium: 134 mmol/L — ABNORMAL LOW (ref 135–145)

## 2017-03-29 NOTE — Care Management Obs Status (Signed)
[facility] NOTIFICATION   Patient Details  Name: Michelle Fuller MRN: [id number] Date of Birth: 1935/05/05   Medicare Observation Status Notification Given:  Yes    Bethena Roys, RN 03/29/2017, 1:57 PM

## 2017-03-29 NOTE — Discharge Summary (Signed)
Pt given discharge instructions and follow up info. Denies questions. Family to take pt home. Belongings returned to pt.

## 2017-03-29 NOTE — Plan of Care (Signed)
Pt reports no nausea or diarrhea. Denies pain. Reports desire to start eating soft food.

## 2017-03-29 NOTE — Care Management CC44 (Signed)
Condition Code 44 Documentation Completed  Patient Details  Name: Michelle Fuller MRN: [id number] Date of Birth: 05-09-1935   Condition Code 44 given:  Yes Patient signature on Condition Code 44 notice:  Yes Documentation of 2 MD's agreement:  Yes Code 44 added to claim:  Yes    Bethena Roys, RN 03/29/2017, 1:57 PM

## 2017-03-31 NOTE — Discharge Summary (Signed)
Discharge Summary  Michelle Fuller [id number]:[id number] DOB: [birth year]  PCP: Lorne Skeens, MD  Admit date: 03/27/2017 Discharge date: 03/29/2017  Time spent: <28mns  Recommendations for Outpatient Follow-up:  1. PCP   Discharge Diagnoses:  Active Hospital Problems   Diagnosis Date Noted  . Nausea vomiting and diarrhea 03/27/2017  . Renal lesion 03/27/2017  . Abdominal pain 03/27/2017  . Sepsis ([place] 03/27/2017  . Hyperlipidemia 01/16/2013  . Hypertension 01/16/2013    Resolved Hospital Problems  No resolved problems to display.    Discharge Condition: Stable  Diet recommendation: Heart healthy   Vitals:   03/29/17 0635 03/29/17 0929  BP: 137/64 (!) 134/59  Pulse: 84 81  Resp: 17 18  Temp: 98.5 F (36.9 C) 98.4 F (36.9 C)  SpO2: 94% 95%    History of present illness:  82year old female with medical history significant of hypertension, hyperlipidemia, GERD, depression, anxiety, remote right breast cancer (lumpectomy and radiation therapy), diverticulitis presents with nausea, vomiting, diarrhea and abdominal pain for the past 2 days.  Patient reported eating barbecue at an event in church shortly prior to presentation. Patient met sepsis criteria with tachycardia, tachypnea, mild leukocytosis and elevated lactic acid.  Patient was admitted for further management  Upon discharge, pt reported feeling better overall, and was stable to be discharged.  Hospital Course:  Principal Problem:   Nausea vomiting and diarrhea Active Problems:   Hypertension   Hyperlipidemia   Renal lesion   Abdominal pain   Sepsis ([place]  #Acute gastroenteritis Improved Likely viral, diarrhea resolved upon admission, so no stool sample was obtained Afebrile, no leukocytosis For calcitonin negative, lactic acid trended down Managed symptomatically PCP follow up  #Hypokalemia Resolved Likely due to above Replaced as needed  #Hypertension Stable Continue home BP  meds  #Hyperlipidemia Lipitor  #Renal lesion Subcentimeter lesions in both kidneys Likely benign, not new, no change since previous study PCP follow-up      Procedures:  None  Consultations:  None  Discharge Exam: BP (!) 134/59 (BP Location: Left Arm)   Pulse 81   Temp 98.4 F (36.9 C) (Oral)   Resp 18   Ht '5\' 4"'$  (1.626 m)   Wt 70.9 kg (156 lb 4.9 oz)   LMP 05/25/1999   SpO2 95%   BMI 26.83 kg/m   General: Alert, awake, oriented x3  Cardiovascular: S1-S2, no added hrt sound  Abdomen: soft, non-tender, non-distended, bowel sounds present  Discharge Instructions You were cared for by a hospitalist during your hospital stay. If you have any questions about your discharge medications or the care you received while you were in the hospital after you are discharged, you can call the unit and asked to speak with the hospitalist on call if the hospitalist that took care of you is not available. Once you are discharged, your primary care physician will handle any further medical issues. Please note that NO REFILLS for any discharge medications will be authorized once you are discharged, as it is imperative that you return to your primary care physician (or establish a relationship with a primary care physician if you do not have one) for your aftercare needs so that they can reassess your need for medications and monitor your lab values.  Discharge Instructions    Diet - low sodium heart healthy   Complete by:  As directed    Increase activity slowly   Complete by:  As directed      Allergies as of 03/29/2017  Reactions   Clonidine Other (See Comments)   Fatigue drymouth   Hydrocodone-acetaminophen Nausea Only   Latex Itching, Rash   Shingrix [zoster Vac Recomb Adjuvanted] Other (See Comments)   Chills, nausea, diarrhea, and lightheadedness after second dose   Flagyl [metronidazole] Hives, Nausea And Vomiting, Nausea Only, Rash   Penicillin G Rash    Penicillins Hives, Rash   Sulfamethoxazole Rash   Sulfamethoxazole-trimethoprim Rash      Medication List    TAKE these medications   aspirin 81 MG tablet Take 1 tablet (81 mg total) by mouth daily.   atorvastatin 10 MG tablet Commonly known as:  LIPITOR Take 10 mg by mouth every morning.   carboxymethylcellulose 0.5 % Soln Commonly known as:  REFRESH PLUS Place 2 drops daily as needed into both eyes.   ciprofloxacin 250 MG tablet Commonly known as:  CIPRO Take 1 tablet by mouth twice a day for 3 days for UTI   CITRACAL PETITES/VITAMIN D PO Take 2 tablets by mouth 2 (two) times daily.   clonazePAM 0.5 MG tablet Commonly known as:  KLONOPIN Take 0.5 mg by mouth 2 (two) times daily as needed for anxiety.   CULTURELLE PO Take 1 capsule by mouth every morning.   FINACEA 15 % cream Generic drug:  Azelaic Acid Apply 1 application topically at bedtime.   ibuprofen 200 MG tablet Commonly known as:  ADVIL,MOTRIN Take 2 tablets 3 times a day as needed for knee pain   indapamide 1.25 MG tablet Commonly known as:  LOZOL Take 1.25 mg by mouth every morning.   multivitamin with minerals Tabs tablet Take 1 tablet by mouth every morning.   omeprazole 20 MG capsule Commonly known as:  PRILOSEC Take 1 capsule daily for 2 weeks every 4 months   polyethylene glycol powder powder Commonly known as:  GLYCOLAX/MIRALAX Take 17 g by mouth daily as needed.   primidone 50 MG tablet Commonly known as:  MYSOLINE Take 3 tablets (150 mg total) by mouth daily with breakfast.   propranolol ER 60 MG 24 hr capsule Commonly known as:  INDERAL LA Take 60 mg by mouth 2 (two) times daily. Takes as needed if diastolic BP greater than 90   SM FLAX SEED OIL 1000 MG Caps Take 1,000 mg by mouth every morning.   valsartan 320 MG tablet Commonly known as:  DIOVAN Take 320 mg by mouth every morning.      Allergies  Allergen Reactions  . Clonidine Other (See Comments)    Fatigue drymouth   . Hydrocodone-Acetaminophen Nausea Only  . Latex Itching and Rash  . Shingrix [Zoster Vac Recomb Adjuvanted] Other (See Comments)    Chills, nausea, diarrhea, and lightheadedness after second dose  . Flagyl [Metronidazole] Hives, Nausea And Vomiting, Nausea Only and Rash  . Penicillin G Rash  . Penicillins Hives and Rash  . Sulfamethoxazole Rash  . Sulfamethoxazole-Trimethoprim Rash   Follow-up Information    Altheimer, Legrand Como, MD. Schedule an appointment as soon as possible for a visit in 1 week(s).   Specialty:  Endocrinology Contact information: [address] [place] [address] [phone]            The results of significant diagnostics from this hospitalization (including imaging, microbiology, ancillary and laboratory) are listed below for reference.    Significant Diagnostic Studies: Ct Abdomen Pelvis W Contrast  Result Date: 03/27/2017 CLINICAL DATA:  Nausea, vomiting, and diarrhea. Weakness. Painful urination. EXAM: CT ABDOMEN AND PELVIS WITH CONTRAST TECHNIQUE:  Multidetector CT imaging of the abdomen and pelvis was performed using the standard protocol following bolus administration of intravenous contrast. CONTRAST:  100 mL Isovue-300 COMPARISON:  03/30/2013 FINDINGS: Lower chest: Atelectasis or scarring in the lung bases. Small esophageal hiatal hernia. Hepatobiliary: Mild diffuse fatty infiltration of the liver. No focal liver lesions. Multiple calcified gallstones. No gallbladder wall thickening or edema. No bile duct dilatation. Pancreas: Unremarkable. No pancreatic ductal dilatation or surrounding inflammatory changes. Spleen: Normal in size without focal abnormality. Adrenals/Urinary Tract: No adrenal gland nodules. Focal subcentimeter lesions in both kidneys. Two of the lesions on the right kidney demonstrate increased density. This could indicate hemorrhagic cyst or solid mass. No change since previous study. No hydronephrosis. Mild thickening  and irregularity of the bladder wall likely representing cystitis. Stomach/Bowel: Stomach, small bowel, and colon are not abnormally distended. No wall thickening is appreciated. Scattered diverticula in the colon. No evidence of diverticulitis. Appendix is normal. Vascular/Lymphatic: Aortic atherosclerosis. No enlarged abdominal or pelvic lymph nodes. Reproductive: Uterus and bilateral adnexa are unremarkable. Other: No abdominal wall hernia or abnormality. No abdominopelvic ascites. Musculoskeletal: Degenerative changes in the spine. No destructive bone lesions IMPRESSION: 1. Small esophageal hiatal hernia. 2. Mild diffuse fatty infiltration of the liver. 3. Cholelithiasis without evidence of cholecystitis. 4. Subcentimeter lesions in both kidneys. Two right renal lesions demonstrate increased density suggesting hemorrhagic cyst or solid mass. No change since previous study suggests likely benign etiology. 5. Aortic atherosclerosis. 6. Diverticulosis of the colon without evidence of diverticulitis. Electronically Signed   By: Lucienne Capers M.D.   On: 03/27/2017 21:54   Dg Abd 2 Views  Result Date: 03/28/2017 CLINICAL DATA:  Constipation. EXAM: ABDOMEN - 2 VIEW COMPARISON:  CT abdomen and pelvis 03/27/2017 FINDINGS: Gas and stool scattered throughout the colon. Gas-filled small bowel. No small or large bowel distention. No free intra-abdominal air. No abnormal air-fluid levels. Multiple calcifications in the right upper quadrant consistent with gallstones. Degenerative changes in the spine and hips. Probable small left pleural effusion. IMPRESSION: Nonobstructive bowel gas pattern. Gas within non dilated small and large bowel may indicate ileus. Cholelithiasis. Probable small left pleural effusion. Electronically Signed   By: Lucienne Capers M.D.   On: 03/28/2017 23:04    Microbiology: Recent Results (from the past 240 hour(s))  Culture, blood (x 2)     Status: None (Preliminary result)   Collection  Time: 03/28/17  1:30 AM  Result Value Ref Range Status   Specimen Description BLOOD LEFT HAND  Final   Special Requests IN PEDIATRIC BOTTLE Blood Culture adequate volume  Final   Culture NO GROWTH 2 DAYS  Final   Report Status PENDING  Incomplete  Culture, blood (x 2)     Status: None (Preliminary result)   Collection Time: 03/28/17  1:55 AM  Result Value Ref Range Status   Specimen Description BLOOD LEFT HAND  Final   Special Requests IN PEDIATRIC BOTTLE Blood Culture adequate volume  Final   Culture NO GROWTH 2 DAYS  Final   Report Status PENDING  Incomplete     Labs: Basic Metabolic Panel: Recent Labs  Lab 03/27/17 1722 03/28/17 0630 03/28/17 1827 03/29/17 0338  NA 133* 136 133* 134*  K 3.6 2.6* 3.8 3.5  CL 95* 106 104 106  CO2 27 21* 24 24  GLUCOSE 143* 99 210* 96  BUN 22* '14 10 6  '$ CREATININE 0.97 0.74 0.82 0.74  CALCIUM 9.2 8.1* 7.9* 8.3*   Liver Function Tests: Recent Labs  Lab  03/27/17 1722  AST 28  ALT 20  ALKPHOS 78  BILITOT 0.9  PROT 7.1  ALBUMIN 3.9   Recent Labs  Lab 03/27/17 1722  LIPASE 33   No results for input(s): AMMONIA in the last 168 hours. CBC: Recent Labs  Lab 03/27/17 1722 03/28/17 0630 03/29/17 0338  WBC 11.2* 6.4 6.7  NEUTROABS  --   --  4.1  HGB 15.8* 13.4 12.7  HCT 45.7 40.5 37.0  MCV 87.7 88.4 88.1  PLT 229 197 180   Cardiac Enzymes: No results for input(s): CKTOTAL, CKMB, CKMBINDEX, TROPONINI in the last 168 hours. BNP: BNP (last 3 results) No results for input(s): BNP in the last 8760 hours.  ProBNP (last 3 results) No results for input(s): PROBNP in the last 8760 hours.  CBG: Recent Labs  Lab 03/29/17 0829 03/29/17 1225  GLUCAP 88 98       Signed:  Alma Friendly, MD [facility] 03/31/2017, 9:47 AM

## 2017-04-02 LAB — CULTURE, BLOOD (ROUTINE X 2)
CULTURE: NO GROWTH
Culture: NO GROWTH
SPECIAL REQUESTS: ADEQUATE
SPECIAL REQUESTS: ADEQUATE

## 2017-09-14 ENCOUNTER — Telehealth: Payer: Self-pay | Admitting: *Deleted

## 2017-09-14 NOTE — Telephone Encounter (Signed)
Called patient re: BMD results.  "Please let patient know mild osteopenia present in spine and hip. Ok to keep monitoring and consider repeating in 2-3 years." per SM  Patient notified. Report to scan.

## 2017-09-20 ENCOUNTER — Encounter: Payer: Self-pay | Admitting: Obstetrics & Gynecology

## 2017-12-20 ENCOUNTER — Encounter (HOSPITAL_COMMUNITY): Payer: Self-pay | Admitting: Emergency Medicine

## 2017-12-20 ENCOUNTER — Emergency Department (HOSPITAL_COMMUNITY)
Admission: EM | Admit: 2017-12-20 | Discharge: 2017-12-20 | Disposition: A | Discharge status: Home / Self Care | Payer: Medicare Other | Attending: Emergency Medicine | Admitting: Emergency Medicine

## 2017-12-20 ENCOUNTER — Other Ambulatory Visit: Payer: Self-pay

## 2017-12-20 DIAGNOSIS — I10 Essential (primary) hypertension: Secondary | ICD-10-CM | POA: Diagnosis not present

## 2017-12-20 DIAGNOSIS — R42 Dizziness and giddiness: Secondary | ICD-10-CM | POA: Insufficient documentation

## 2017-12-20 DIAGNOSIS — T675XXA Heat exhaustion, unspecified, initial encounter: Secondary | ICD-10-CM | POA: Diagnosis not present

## 2017-12-20 DIAGNOSIS — Z91040 Latex allergy status: Secondary | ICD-10-CM | POA: Insufficient documentation

## 2017-12-20 DIAGNOSIS — R5383 Other fatigue: Secondary | ICD-10-CM | POA: Insufficient documentation

## 2017-12-20 DIAGNOSIS — Z79899 Other long term (current) drug therapy: Secondary | ICD-10-CM | POA: Insufficient documentation

## 2017-12-20 DIAGNOSIS — Z853 Personal history of malignant neoplasm of breast: Secondary | ICD-10-CM | POA: Insufficient documentation

## 2017-12-20 DIAGNOSIS — R1110 Vomiting, unspecified: Secondary | ICD-10-CM | POA: Diagnosis not present

## 2017-12-20 DIAGNOSIS — E860 Dehydration: Secondary | ICD-10-CM | POA: Diagnosis not present

## 2017-12-20 DIAGNOSIS — R531 Weakness: Secondary | ICD-10-CM | POA: Diagnosis not present

## 2017-12-20 DIAGNOSIS — Z7982 Long term (current) use of aspirin: Secondary | ICD-10-CM | POA: Insufficient documentation

## 2017-12-20 DIAGNOSIS — R55 Syncope and collapse: Secondary | ICD-10-CM | POA: Diagnosis not present

## 2017-12-20 LAB — CBC WITH DIFFERENTIAL/PLATELET
ABS IMMATURE GRANULOCYTES: 0.1 10*3/uL (ref 0.0–0.1)
Basophils Absolute: 0.1 10*3/uL (ref 0.0–0.1)
Basophils Relative: 1 %
EOS ABS: 0.1 10*3/uL (ref 0.0–0.7)
Eosinophils Relative: 2 %
HEMATOCRIT: 46.2 % — AB (ref 36.0–46.0)
Hemoglobin: 15.5 g/dL — ABNORMAL HIGH (ref 12.0–15.0)
IMMATURE GRANULOCYTES: 1 %
LYMPHS ABS: 1.2 10*3/uL (ref 0.7–4.0)
Lymphocytes Relative: 17 %
MCH: 30.5 pg (ref 26.0–34.0)
MCHC: 33.5 g/dL (ref 30.0–36.0)
MCV: 90.9 fL (ref 78.0–100.0)
MONO ABS: 1 10*3/uL (ref 0.1–1.0)
Monocytes Relative: 13 %
NEUTROS ABS: 4.8 10*3/uL (ref 1.7–7.7)
Neutrophils Relative %: 66 %
Platelets: 219 10*3/uL (ref 150–400)
RBC: 5.08 MIL/uL (ref 3.87–5.11)
RDW: 12.6 % (ref 11.5–15.5)
WBC: 7.2 10*3/uL (ref 4.0–10.5)

## 2017-12-20 LAB — URINALYSIS, ROUTINE W REFLEX MICROSCOPIC
Bilirubin Urine: NEGATIVE
Glucose, UA: NEGATIVE mg/dL
Hgb urine dipstick: NEGATIVE
Ketones, ur: NEGATIVE mg/dL
LEUKOCYTES UA: NEGATIVE
Nitrite: NEGATIVE
PH: 7 (ref 5.0–8.0)
Protein, ur: NEGATIVE mg/dL
Specific Gravity, Urine: 1.008 (ref 1.005–1.030)

## 2017-12-20 LAB — I-STAT TROPONIN, ED: Troponin i, poc: 0 ng/mL (ref 0.00–0.08)

## 2017-12-20 LAB — BASIC METABOLIC PANEL
Anion gap: 9 (ref 5–15)
BUN: 15 mg/dL (ref 8–23)
CHLORIDE: 98 mmol/L (ref 98–111)
CO2: 28 mmol/L (ref 22–32)
CREATININE: 1.01 mg/dL — AB (ref 0.44–1.00)
Calcium: 10.1 mg/dL (ref 8.9–10.3)
GFR calc non Af Amer: 51 mL/min — ABNORMAL LOW (ref >60)
GFR, EST AFRICAN AMERICAN: 59 mL/min — AB (ref >60)
GLUCOSE: 128 mg/dL — AB (ref 70–99)
Potassium: 3.3 mmol/L — ABNORMAL LOW (ref 3.5–5.1)
Sodium: 135 mmol/L (ref 135–145)

## 2017-12-20 MED ORDER — SODIUM CHLORIDE 0.9 % IV BOLUS
1000.0000 mL | Freq: Once | INTRAVENOUS | Status: AC
  Administered 2017-12-20: 1000 mL via INTRAVENOUS

## 2017-12-20 NOTE — ED Provider Notes (Signed)
[facility] EMERGENCY DEPARTMENT Provider Note   CSN: [id number] Arrival date & time: 12/20/17  1709     History   Chief Complaint No chief complaint on file.   HPI Michelle Fuller is a 83 y.o. female with PMHo/o Breast CA, CP, Depression, HTL, HTN who presents for evaluation of syncope that occurred just prior to ED arrival.  Patient was with her family at [facility] and reports that she was standing.  She told her sister that she felt lightheaded and that she was going to pass out.  Sister was able to catch her and sat her down.  She did not fall or hit the ground.  They do report positive LOC for approximately 2 minutes.  No seizure-like activity.  They state patient was given fluids to drink and applied cold compresses which helped her come to.  Afterwards, she had one episode of nonbloody, nonbilious vomiting.  Patient reports that she had been out there for several hours.  She does state that she was drinking water while being out there.  She did not have any preceding chest pain.  Patient reports feeling generalized weakness and fatigue.  He does report a history of vasovagal syncope.  The also reports she has had similar episodes when she is dehydrated.  She denies any vision changes, chest pain, difficulty breathing, abdominal pain, numbness/weakness of her arms or legs.  The history is provided by the patient.    Past Medical History:  Diagnosis Date  . Breast cancer ([place])    Right breast, status post XRT  . Cataracts, both eyes   . Chest pain 2015   recent episode while in sun- recovered in shade with water  . Colon polyp   . Depression   . Diverticulitis   . Diverticulosis   . Elevated LDL cholesterol level   . Essential tremor   . Goiter    multinodular  . Hyperlipidemia   . Hypertension   . Osteopenia 8/97   Spine  . Ovarian cyst    found on CT scan  . Post-menopausal bleeding 01/2000   Huge polyps exc  . Syncope   . Vitamin D  deficiency     Patient Active Problem List   Diagnosis Date Noted  . Renal lesion 03/27/2017  . Nausea vomiting and diarrhea 03/27/2017  . Abdominal pain 03/27/2017  . Sepsis ([place]) 03/27/2017  . Mixed dyslipidemia 08/10/2016  . Multinodular goiter (nontoxic) 08/10/2016  . Vitamin D insufficiency 08/10/2016  . Mobitz type II atrioventricular block 04/21/2015  . Pain in the chest   . Chest pain 04/20/2015  . GERD (gastroesophageal reflux disease) 04/20/2015  . HX: breast cancer 09/10/2013  . Diverticulitis 03/31/2013  . Vasovagal near syncope 03/31/2013  . Visual disturbance, transient 01/16/2013  . Hypertension 01/16/2013  . Hyperlipidemia 01/16/2013  . Cerebrovascular disease 01/16/2013  . Essential tremor 01/16/2013    Past Surgical History:  Procedure Laterality Date  . Bilateral cateract surgery  2014  . BREAST LUMPECTOMY Right 4/96  . HYSTEROSCOPY  0/01  . LAPAROSCOPIC BILATERAL SALPINGO OOPHERECTOMY Bilateral 01/07/2014   Procedure: LAPAROSCOPIC BILATERAL SALPINGO OOPHORECTOMY;  Surgeon: Lyman Speller, MD;  Location: [facility];  Service: Gynecology;  Laterality: Bilateral;     OB History    Gravida  3   Para  3   Term      Preterm      AB      Living  3     SAB  TAB      Ectopic      Multiple      Live Births               Home Medications    Prior to Admission medications   Medication Sig Start Date End Date Taking? Authorizing Provider  aspirin 81 MG tablet Take 1 tablet (81 mg total) by mouth daily. 01/16/13   Charlynne Cousins, MD  atorvastatin (LIPITOR) 10 MG tablet Take 10 mg by mouth every morning.     [provider]  Calcium Citrate-Vitamin D (CITRACAL PETITES/VITAMIN D PO) Take 2 tablets by mouth 2 (two) times daily.    [provider]  carboxymethylcellulose (REFRESH PLUS) 0.5 % SOLN Place 2 drops daily as needed into both eyes.    [provider]  ciprofloxacin (CIPRO) 250 MG tablet Take 1  tablet by mouth twice a day for 3 days for UTI 02/25/17   [provider]  clonazePAM (KLONOPIN) 0.5 MG tablet Take 0.5 mg by mouth 2 (two) times daily as needed for anxiety.  09/09/14   [provider]  FINACEA 15 % cream Apply 1 application topically at bedtime.  12/04/13   [provider]  Flaxseed, Linseed, (SM FLAX SEED OIL) 1000 MG CAPS Take 1,000 mg by mouth every morning.     [provider]  ibuprofen (ADVIL,MOTRIN) 200 MG tablet Take 2 tablets 3 times a day as needed for knee pain    [provider]  indapamide (LOZOL) 1.25 MG tablet Take 1.25 mg by mouth every morning.     [provider]  Lactobacillus Rhamnosus, GG, (CULTURELLE PO) Take 1 capsule by mouth every morning.     [provider]  Multiple Vitamin (MULTIVITAMIN WITH MINERALS) TABS tablet Take 1 tablet by mouth every morning.    [provider]  omeprazole (PRILOSEC) 20 MG capsule Take 1 capsule daily for 2 weeks every 4 months    [provider]  polyethylene glycol powder (GLYCOLAX/MIRALAX) powder Take 17 g by mouth daily as needed.    [provider]  primidone (MYSOLINE) 50 MG tablet Take 3 tablets (150 mg total) by mouth daily with breakfast. 01/08/14   Megan Salon, MD  propranolol ER (INDERAL LA) 60 MG 24 hr capsule Take 60 mg by mouth 2 (two) times daily. Takes as needed if diastolic BP greater than 90    [provider]  valsartan (DIOVAN) 320 MG tablet Take 320 mg by mouth every morning.     [provider]    Family History Family History  Problem Relation Age of Onset  . Heart disease Mother        Died at 38 of MI - says doctors thought it was due to mistreatment of thyroid dz in youth  . Thyroid disease Mother   . Heart attack Mother   . Alzheimer's disease Father   . Thyroid disease Maternal Grandmother   . Stroke Maternal Grandmother     Social History Social History   Tobacco Use  . Smoking  status: Never Smoker  . Smokeless tobacco: Never Used  Substance Use Topics  . Alcohol use: No    Alcohol/week: 0.0 oz  . Drug use: No     Allergies   Clonidine; Hydrocodone-acetaminophen; Latex; Shingrix [zoster vac recomb adjuvanted]; Flagyl [metronidazole]; Penicillin g; Penicillins; Sulfamethoxazole; and Sulfamethoxazole-trimethoprim   Review of Systems Review of Systems  Constitutional: Negative for fever.  Respiratory: Negative for cough and  shortness of breath.   Cardiovascular: Negative for chest pain.  Gastrointestinal: Positive for vomiting. Negative for abdominal pain and nausea.  Genitourinary: Negative for dysuria and hematuria.  Neurological: Positive for dizziness and light-headedness. Negative for syncope and headaches.  All other systems reviewed and are negative.    Physical Exam Updated Vital Signs BP (!) 154/71 (BP Location: Left Arm)   Pulse 73   Temp (!) 97.3 F (36.3 C) (Oral)   Resp 20   Ht 5\' 4"  (1.626 m)   Wt 68 kg (150 lb)   LMP 05/25/1999   SpO2 96%   BMI 25.75 kg/m   Physical Exam  Constitutional: She is oriented to person, place, and time. She appears well-developed and well-nourished.  Appears uncomfortable  HENT:  Head: Normocephalic and atraumatic.  Mouth/Throat: Oropharynx is clear and moist and mucous membranes are normal.  Eyes: Pupils are equal, round, and reactive to light. Conjunctivae, EOM and lids are normal.  Neck: Full passive range of motion without pain.  Cardiovascular: Normal rate, regular rhythm, normal heart sounds and normal pulses. Exam reveals no gallop and no friction rub.  No murmur heard. Pulses:      Radial pulses are 2+ on the right side, and 2+ on the left side.       Dorsalis pedis pulses are 2+ on the right side, and 2+ on the left side.  Pulmonary/Chest: Effort normal and breath sounds normal.  Lungs clear to auscultation bilaterally.  Symmetric chest rise.  No wheezing, rales, rhonchi.  Abdominal:  Soft. Normal appearance. There is no tenderness. There is no rigidity and no guarding.  Musculoskeletal: Normal range of motion.  Neurological: She is alert and oriented to person, place, and time.  Cranial nerves III-XII intact Follows commands, Moves all extremities  5/5 strength to BUE and BLE  Sensation intact throughout all major nerve distributions Normal coordination No pronator drift. No slurred speech. No facial droop.   Skin: Skin is warm. Capillary refill takes less than 2 seconds. She is diaphoretic. There is pallor.  Psychiatric: She has a normal mood and affect. Her speech is normal.  Nursing note and vitals reviewed.    ED Treatments / Results  Labs (all labs ordered are listed, but only abnormal results are displayed) Labs Reviewed  BASIC METABOLIC PANEL - Abnormal; Notable for the following components:      Result Value   Potassium 3.3 (*)    Glucose, Bld 128 (*)    Creatinine, Ser 1.01 (*)    GFR calc non Af Amer 51 (*)    GFR calc Af Amer 59 (*)    All other components within normal limits  CBC WITH DIFFERENTIAL/PLATELET - Abnormal; Notable for the following components:   Hemoglobin 15.5 (*)    HCT 46.2 (*)    All other components within normal limits  URINALYSIS, ROUTINE W REFLEX MICROSCOPIC - Abnormal; Notable for the following components:   APPearance CLOUDY (*)    All other components within normal limits  I-STAT TROPONIN, ED    EKG EKG Interpretation  Date/Time:  Tuesday December 20 2017 19:31:37 EDT Ventricular Rate:  85 PR Interval:    QRS Duration: 93 QT Interval:  397 QTC Calculation: 473 R Axis:   -45 Text Interpretation:  Sinus rhythm Multiple ventricular premature complexes Prolonged PR interval Left anterior fascicular block LVH by voltage No significant change was found Confirmed by Jola Schmidt [phone]) on 12/20/2017 8:53:11 PM   Radiology No results found.  Procedures Procedures (  including critical care time)  Medications Ordered  in ED Medications  sodium chloride 0.9 % bolus 1,000 mL (0 mLs Intravenous Stopped 12/20/17 2216)     Initial Impression / Assessment and Plan / ED Course  I have reviewed the triage vital signs and the nursing notes.  Pertinent labs & imaging results that were available during my care of the patient were reviewed by me and considered in my medical decision making (see chart for details).      83 y.o. with PMH/o Breast CA, CP, Depression, HTL, HTN who presents for evaluation of syncopal episode that occurred just prior to ED arrival.  Patient was outside at [employer] when she had a syncopal episode. 2 minute LOC. No seizure activity. 1 episode of vomiting. No CP, SOB. Patient is afebrile, non-toxic appearing, sitting comfortably on examination table. Vital signs reviewed and stable.  Patient appears diaphoretic and pale. Equal pulses in all 4 extremities. Plan to check basic labs, EKG, Trop.   BMP shows potassium 3.3.  BUN is 15.  Creatinine is 1.01.  Troponin negative.  CBC without any significant leukocytosis, anemia.  UA negative for any acute infection.  Reevaluation.  Patient feels much better.  He is no longer pale or diaphoretic.  She is alert and is answering all my questions.  We will plan to ambulate and p.o. challenge.  Patient ambulated to the bathroom bathroom without any difficulty.  She was able to tolerate p.o. without any vomiting.  She reports improvement in symptoms.  Vital signs are stable.  Suspect that this is cingulate related to heat exhaustion.  Patient stable for discharge at this time.  Encourage at home supportive therapies.  Instructed patient follow-up with primary care doctor next to 4 days for further evaluation. Patient had ample opportunity for questions and discussion. All patient's questions were answered with full understanding. Strict return precautions discussed. Patient expresses understanding and agreement to plan.    Final Clinical Impressions(s) / ED  Diagnoses   Final diagnoses:  Syncope, unspecified syncope type  Dehydration  Heat exhaustion, initial encounter    ED Discharge Orders    None       Desma Mcgregor 12/21/17 0006    Jola Schmidt, MD 12/21/17 0110

## 2017-12-20 NOTE — ED Notes (Signed)
Pt ambulated to bathroom with UA spec  Collected VS documented Family @ bedside

## 2017-12-20 NOTE — ED Notes (Signed)
Pt ambulatory to bathroom without any problems 

## 2017-12-20 NOTE — Discharge Instructions (Signed)
Make sure you are drinking plenty of fluids and staying hydrated.  Follow-up with your primary care doctor the next several days.  Return to the emergency department for any chest pain, difficulty breathing, nausea/vomiting, difficulty moving her arms or legs, difficulty walking or any other worsening or concerning symptoms.

## 2019-06-04 ENCOUNTER — Ambulatory Visit: Payer: Medicare Other | Attending: Internal Medicine

## 2019-06-04 DIAGNOSIS — Z23 Encounter for immunization: Secondary | ICD-10-CM

## 2019-06-04 NOTE — Progress Notes (Signed)
   Covid-19 Vaccination Clinic  Name:  SHAWNAH JERSEY    MRN: [id number] DOB: [birth year]  06/04/2019  Ms. Nardini was observed post Covid-19 immunization for 30 minutes based on pre-vaccination screening without incidence. She was provided with Vaccine Information Sheet and instruction to access the V-Safe system.   Ms. Pokorny was instructed to call 911 with any severe reactions post vaccine: Marland Kitchen Difficulty breathing  . Swelling of your face and throat  . A fast heartbeat  . A bad rash all over your body  . Dizziness and weakness    Immunizations Administered    Name Date Dose VIS Date Route   Pfizer COVID-19 Vaccine 06/04/2019 12:14 PM 0.3 mL 05/04/2019 Intramuscular   Manufacturer: [employer], [employer]   Lot: [id number]   [place]: [id number]

## 2019-06-13 ENCOUNTER — Ambulatory Visit: Payer: Medicare Other

## 2019-06-22 ENCOUNTER — Ambulatory Visit: Payer: Medicare Other | Admitting: Obstetrics & Gynecology

## 2019-06-24 ENCOUNTER — Ambulatory Visit: Payer: Medicare PPO | Attending: Internal Medicine

## 2019-06-24 DIAGNOSIS — Z23 Encounter for immunization: Secondary | ICD-10-CM | POA: Insufficient documentation

## 2019-06-24 NOTE — Progress Notes (Signed)
   Covid-19 Vaccination Clinic  Name:  Michelle Fuller    MRN: [id number] DOB: 24-Apr-1935  06/24/2019  [name] was observed post Covid-19 immunization for 30 minutes based on pre-vaccination screening without incidence. She was provided with Vaccine Information Sheet and instruction to access the V-Safe system.   [name] was instructed to call 911 with any severe reactions post vaccine: Marland Kitchen Difficulty breathing  . Swelling of your face and throat  . A fast heartbeat  . A bad rash all over your body  . Dizziness and weakness    Immunizations Administered    Name Date Dose VIS Date Route   Pfizer COVID-19 Vaccine 06/24/2019 12:39 PM 0.3 mL 05/04/2019 Intramuscular   Manufacturer: [place]   Lot: [id number]   [place]: [id number]

## 2019-09-06 ENCOUNTER — Other Ambulatory Visit: Payer: Self-pay

## 2019-09-06 NOTE — Progress Notes (Signed)
85 y.o. G3P3 Widowed White or Caucasian female here for annual exam.  Doing well.  Denies vaginal bleeding.  Has been vaccinated for Covid.  Saw Dr. Elyse Hsu in March.  Pulse was in the 30's.  Propranolol dosage was lowered.    Patient's last menstrual period was 05/25/1999.          Sexually active: No.  The current method of family planning is post menopausal status.    Exercising: Yes.    tai chi Smoker:  no  Health Maintenance: Pap:  09-10-14 neg History of abnormal Pap:  yes MMG:  07/2018 waiting on fax Colonoscopy:  2011 f/u 16yr, pt no longer needs one BMD:   2019 osteopenia f/u 2-386yrTDaP:  2015 Pneumonia vaccine(s):  2015 Shingrix:   done Hep C testing: not done Screening Labs: with Dr. AlElyse Hsu reports that she has never smoked. She has never used smokeless tobacco. She reports that she does not drink alcohol or use drugs.  Past Medical History:  Diagnosis Date  . Abnormal Pap smear of cervix   . [place]   Right breast, status post XRT  . Cataracts, both eyes   . Chest pain 2015   recent episode while in sun- recovered in shade with water  . Colon polyp   . Depression   . Diverticulitis   . Diverticulosis   . Elevated LDL cholesterol level   . Essential tremor   . Goiter    multinodular  . Hyperlipidemia   . Hypertension   . Osteopenia 8/97   Spine  . Ovarian cyst    found on CT scan  . Post-menopausal bleeding 01/2000   Huge polyps exc  . Syncope   . Vitamin D deficiency     Past Surgical History:  Procedure Laterality Date  . Bilateral cateract surgery  2014  . BREAST LUMPECTOMY Right 4/96  . HYSTEROSCOPY  0/01  . LAPAROSCOPIC BILATERAL SALPINGO OOPHERECTOMY Bilateral 01/07/2014   Procedure: LAPAROSCOPIC BILATERAL SALPINGO OOPHORECTOMY;  Surgeon: MaLyman SpellerMD;  [facility];  Service: Gynecology;  Laterality: Bilateral;    Current Outpatient Medications  Medication Sig Dispense Refill  . aspirin 81 MG tablet Take 1  tablet (81 mg total) by mouth daily. 30 tablet   . atorvastatin (LIPITOR) 10 MG tablet Take 10 mg by mouth every morning.     . Calcium Citrate-Vitamin D (CITRACAL PETITES/VITAMIN D PO) Take 2 tablets by mouth 2 (two) times daily.    . carboxymethylcellulose (REFRESH PLUS) 0.5 % SOLN Place 2 drops daily as needed into both eyes.    . clonazePAM (KLONOPIN) 0.5 MG tablet Take 0.5 mg by mouth 2 (two) times daily as needed for anxiety.     . Marland KitchenINACEA 15 % cream Apply 1 application topically at bedtime.     . Flaxseed, Linseed, (SM FLAX SEED OIL) 1000 MG CAPS Take 1,000 mg by mouth every morning.     . indapamide (LOZOL) 1.25 MG tablet Take 1.25 mg by mouth every morning.     . Lactobacillus Rhamnosus, GG, (CULTURELLE PO) Take 1 capsule by mouth every morning.     . Multiple Vitamin (MULTIVITAMIN WITH MINERALS) TABS tablet Take 1 tablet by mouth every morning.    . Marland Kitchenmeprazole (PRILOSEC) 20 MG capsule Take 1 capsule daily for 2 weeks every 4 months    . polyethylene glycol powder (GLYCOLAX/MIRALAX) powder Take 17 g by mouth daily as needed.    . primidone (MYSOLINE) 50 MG  tablet Take 3 tablets (150 mg total) by mouth daily with breakfast.    . propranolol ER (INDERAL LA) 60 MG 24 hr capsule Take 60 mg by mouth 2 (two) times daily. Takes as needed if diastolic BP greater than 90    . valsartan (DIOVAN) 320 MG tablet Take 320 mg by mouth every morning.     . ciprofloxacin (CIPRO) 250 MG tablet Take 1 tablet by mouth twice a day for 3 days for UTI     No current facility-administered medications for this visit.    Family History  Problem Relation Age of Onset  . Heart disease Mother        Died at 70 of MI - says doctors thought it was due to mistreatment of thyroid dz in youth  . Thyroid disease Mother   . Heart attack Mother   . Alzheimer's disease Father   . Thyroid disease Maternal Grandmother   . Stroke Maternal Grandmother     Review of Systems  Constitutional: Negative.   HENT:  Negative.   Eyes: Negative.   Respiratory: Negative.   Cardiovascular: Negative.   Gastrointestinal: Negative.   Endocrine: Negative.   Genitourinary: Negative.   Musculoskeletal: Negative.   Skin: Negative.   Allergic/Immunologic: Negative.   Neurological: Negative.   Psychiatric/Behavioral: Negative.     Exam:   BP 124/80   Pulse 72   Temp (!) 97.3 F (36.3 C) (Skin)   Ht 5' 4.5" (1.638 m)   Wt 142 lb (64.4 kg)   LMP 05/25/1999   BMI 24.00 kg/m    Height: 5' 4.5" (163.8 cm)  Ht Readings from Last 3 Encounters:  09/07/19 5' 4.5" (1.638 m)  12/20/17 '5\' 4"'$  (1.626 m)  03/28/17 '5\' 4"'$  (1.626 m)   General appearance: alert, cooperative and appears stated age Head: Normocephalic, without obvious abnormality, atraumatic Neck: no adenopathy, supple, symmetrical, trachea midline and thyroid normal to inspection and palpation Lungs: clear to auscultation bilaterally Breasts: normal appearance, no masses or tenderness Heart: regular rate and rhythm Abdomen: soft, non-tender; bowel sounds normal; no masses,  no organomegaly Extremities: extremities normal, atraumatic, no cyanosis or edema Skin: Skin color, texture, turgor normal. No rashes or lesions Lymph nodes: Cervical, supraclavicular, and axillary nodes normal. No abnormal inguinal nodes palpated Neurologic: Grossly normal   Pelvic: External genitalia:  no lesions              Urethra:  normal appearing urethra with no masses, tenderness or lesions              Bartholins and Skenes: normal                 Vagina: normal appearing vagina with normal color and discharge, no lesions              Cervix: no lesions              Pap taken: No. Bimanual Exam:  Uterus:  normal size, contour, position, consistency, mobility, non-tender              Adnexa: normal adnexa and no mass, fullness, tenderness               Rectovaginal: Confirms               Anus:  normal sphincter tone, no lesions  Chaperone, Terence Lux, CMA,  was present for exam.  A:  Well Woman with normal exam PMp, no HRT H/o recurrent UTIs H/o multinodular goiter H/o  breast cancer 4/96 Elevated lipids Hypertension Osteopenia Benign essential tremor H/o ovarian cyst, s/p laparoscopic BSO 8/15  P:   Mammogram guidelines reviewed reviewed.   pap smear not indicated Lab work is done with Dr. Elyse Hsu Colonoscopy not needed for routine screening Pt is going to stop coming for every other year exams but knows to call me with any concerns, new vaginal bleeding, new breast masses.

## 2019-09-07 ENCOUNTER — Other Ambulatory Visit: Payer: Self-pay

## 2019-09-07 ENCOUNTER — Ambulatory Visit (INDEPENDENT_AMBULATORY_CARE_PROVIDER_SITE_OTHER): Payer: Medicare PPO | Admitting: Obstetrics & Gynecology

## 2019-09-07 ENCOUNTER — Encounter: Payer: Self-pay | Admitting: Obstetrics & Gynecology

## 2019-09-07 VITALS — BP 124/80 | HR 72 | Temp 97.3°F | Ht 64.5 in | Wt 142.0 lb

## 2019-09-07 DIAGNOSIS — Z01419 Encounter for gynecological examination (general) (routine) without abnormal findings: Secondary | ICD-10-CM

## 2019-12-25 ENCOUNTER — Encounter: Payer: Self-pay | Admitting: Obstetrics & Gynecology

## 2020-03-30 NOTE — Progress Notes (Signed)
Cardiology Office Note:    Date:  04/01/2020   ID:  [name], DOB Jan 01, 1935, MRN [id number]  PCP:  Lorne Skeens, MD  [facility] Cardiologist:  No primary care provider on file.  [facility] Electrophysiologist:  None   Referring MD: Altheimer, Legrand Como, MD     History of Present Illness:    Michelle Fuller is a 85 y.o. female with a hx of HTN, mobitz type II AVB, CVA, breast cancer s/p XRT and HLD who was referred by Dr. Elyse Hsu for further evaluation of symptomatic bradycardia.  She last saw Dr. Meda Coffee in 06/2016.   In November 2016, she was admitted for chest pain. She was also found to be in Mobitz type II second-degree heart block. Her beta blocker was reduced. Propanolol was decreased from 160 mg to 60 mg. She ruled out for myocardial infarction with negative cardiac enzymes. EKG at that time showed no ischemic changes. She underwent a nuclear stress test which showed no ischemia. EF was normal at 66%. She was also further evaluated with a 48 hour outpatient cardiac monitor. There were no pauses or AV block's.  Today, the patient states that she was monitoring her blood pressure and she noted her heart rate was in the 30s. She was feeling fatigued at the time, but did not feel lightheaded, dizzy, SOB. She presented to PCP where HR was again in the 30s.  Her PCP began to wean the propranolol as thought it was secondary to the medication. She was previously on 60mg  now down to 5mg  daily. HR improved to 50s now, however, her essential tremors are much worse and she has a difficult time writing or feeding herself. She denies any current chest pain, dizziness, lightheadedness, syncope, or LE edema. She continues to feel mildly fatigued.  Past Medical History:  Diagnosis Date  . Abnormal Pap smear of cervix   . Breast cancer ([place])    Right breast, status post XRT  . Cataracts, both eyes   . Chest pain 2015   recent episode while in sun- recovered in shade with  water  . Colon polyp   . Depression   . Diverticulitis   . Diverticulosis   . Elevated LDL cholesterol level   . Essential tremor   . Goiter    multinodular  . Hyperlipidemia   . Hypertension   . Osteopenia 8/97   Spine  . Ovarian cyst    found on CT scan  . Post-menopausal bleeding 01/2000   Huge polyps exc  . Syncope   . Vitamin D deficiency     Past Surgical History:  Procedure Laterality Date  . Bilateral cateract surgery  2014  . BREAST LUMPECTOMY Right 4/96  . HYSTEROSCOPY  0/01  . LAPAROSCOPIC BILATERAL SALPINGO OOPHERECTOMY Bilateral 01/07/2014   Procedure: LAPAROSCOPIC BILATERAL SALPINGO OOPHORECTOMY;  Surgeon: Lyman Speller, MD;  Location: [facility];  Service: Gynecology;  Laterality: Bilateral;    Current Medications: Current Meds  Medication Sig  . aspirin 81 MG tablet Take 1 tablet (81 mg total) by mouth daily.  Marland Kitchen atorvastatin (LIPITOR) 10 MG tablet Take 10 mg by mouth every morning.   . Calcium Citrate-Vitamin D (CITRACAL PETITES/VITAMIN D PO) Take 2 tablets by mouth 2 (two) times daily.  . carboxymethylcellulose (REFRESH PLUS) 0.5 % SOLN Place 2 drops daily as needed into both eyes.  . ciprofloxacin (CIPRO) 250 MG tablet Take 1 tablet by mouth twice a day for 3 days for UTI  . clonazePAM (KLONOPIN)  0.5 MG tablet Take 0.5 mg by mouth 2 (two) times daily as needed for anxiety.   Marland Kitchen FINACEA 15 % cream Apply 1 application topically at bedtime.   . Flaxseed, Linseed, (SM FLAX SEED OIL) 1000 MG CAPS Take 1,000 mg by mouth every morning.   . indapamide (LOZOL) 1.25 MG tablet Take 1.25 mg by mouth every morning.   . Lactobacillus Rhamnosus, GG, (CULTURELLE PO) Take 1 capsule by mouth every morning.   . Multiple Vitamin (MULTIVITAMIN WITH MINERALS) TABS tablet Take 1 tablet by mouth every morning.  Marland Kitchen omeprazole (PRILOSEC) 20 MG capsule Take 1 capsule daily for 2 weeks every 4 months  . polyethylene glycol powder (GLYCOLAX/MIRALAX) powder Take 17 g by mouth daily as  needed.  . primidone (MYSOLINE) 50 MG tablet Take 3 tablets (150 mg total) by mouth daily with breakfast.  . valsartan (DIOVAN) 320 MG tablet Take 320 mg by mouth every morning.   . [DISCONTINUED] propranolol (INDERAL) 10 MG tablet Take 5 mg by mouth 2 (two) times daily.     Allergies:   Clonidine, Hydrocodone-acetaminophen, Latex, Shingrix [zoster vac recomb adjuvanted], Flagyl [metronidazole], Penicillins, and Sulfamethoxazole-trimethoprim   Social History   Socioeconomic History  . Marital status: Widowed    Spouse name: Not on file  . Number of children: 3  . Years of education: BA  . Highest education level: Not on file  Occupational History  . Occupation: Retired Product manager: RETIRED  Tobacco Use  . Smoking status: Never Smoker  . Smokeless tobacco: Never Used  Substance and Sexual Activity  . Alcohol use: No    Alcohol/week: 0.0 standard drinks  . Drug use: No  . Sexual activity: Not Currently    Partners: Male  Other Topics Concern  . Not on file  Social History Narrative   Widowed.  Lives alone.  Ambulates without assistance.   Drinks about 1 cup of green tea a day    Social Determinants of Health   Financial Resource Strain:   . Difficulty of Paying Living Expenses: Not on file  Food Insecurity:   . Worried About Charity fundraiser in the Last Year: Not on file  . Ran Out of Food in the Last Year: Not on file  Transportation Needs:   . Lack of Transportation (Medical): Not on file  . Lack of Transportation (Non-Medical): Not on file  Physical Activity:   . Days of Exercise per Week: Not on file  . Minutes of Exercise per Session: Not on file  Stress:   . Feeling of Stress : Not on file  Social Connections:   . Frequency of Communication with Friends and Family: Not on file  . Frequency of Social Gatherings with Friends and Family: Not on file  . Attends Religious Services: Not on file  . Active Member of Clubs or Organizations: Not on file  .  Attends Archivist Meetings: Not on file  . Marital Status: Not on file     Family History: The patient's family history includes Alzheimer's disease in her father; Heart attack in her mother; Heart disease in her mother; Stroke in her maternal grandmother; Thyroid disease in her maternal grandmother and mother.  ROS:   Please see the history of present illness.    Review of Systems  Constitutional: Positive for malaise/fatigue. Negative for chills, fever and weight loss.  HENT: Negative for hearing loss.   Eyes: Negative for blurred vision.  Respiratory: Negative for shortness of  breath.   Cardiovascular: Negative for chest pain, palpitations, orthopnea, claudication, leg swelling and PND.  Gastrointestinal: Negative for heartburn.  Genitourinary: Negative for dysuria.  Musculoskeletal: Negative for falls.  Neurological: Positive for tremors. Negative for dizziness and loss of consciousness.  Psychiatric/Behavioral: Negative for substance abuse.    EKGs/Labs/Other Studies Reviewed:    The following studies were reviewed today: Myoview 2016:  Nuclear stress EF: 66%.  There was no ST segment deviation noted during stress.  This is a low risk study.  The left ventricular ejection fraction is hyperdynamic (>65%).   1. Fixed small, mild basal anterolateral perfusion defect.  Given normal wall motion, this likely represents attenuation.  No ischemia.  2. Normal wall motion and normal EF. 3. Low risk study.   Holter monitor 2016:  Frequent PVCs (1000 in 24 hours) and infrequent PACs, no runs.  No AV block or pauses.   NO AV block or pauses, no therapy or medication change needed.    TTE 2014: - Left ventricle: The cavity size was normal. Wall thickness  was increased in a pattern of mild LVH. Systolic function  was normal. The estimated ejection fraction was in the  range of 55% to 60%. Wall motion was normal; there were no  regional wall motion  abnormalities. Doppler parameters are  consistent with abnormal left ventricular relaxation  (grade 1 diastolic dysfunction).  - Aortic valve: Trileaflet; mildly thickened leaflets.  Trivial regurgitation.  - Mitral valve: Mildly thickened leaflets . Systolic bowing  without prolapse. Trivial regurgitation.  - Left atrium: The atrium was mildly dilated.  - Atrial septum: No defect or patent foramen ovale was  identified.   Carotid 2014: Summary:  Findings suggest 1-39% internal carotid artery stenosis  bilaterally without evidence of significant plaque  morphology. Vertebral arteries are patent with antegrade  flow.    EKG:  EKG is  ordered today.  The ekg ordered today demonstrates sinus bradycardia with HR 51. LAFB. Poor r-wave progression.  Recent Labs: No results found for requested labs within last 8760 hours.  Recent Lipid Panel    Component Value Date/Time   CHOL 154 01/16/2013 0356   TRIG 197 (H) 01/16/2013 0356   HDL 36 (L) 01/16/2013 0356   CHOLHDL 4.3 01/16/2013 0356   VLDL 39 01/16/2013 0356   LDLCALC 79 01/16/2013 0356      Physical Exam:    VS:  BP (!) 158/90   Pulse (!) 58   Ht 5\' 4"  (1.626 m)   Wt 140 lb 12.8 oz (63.9 kg)   LMP 05/25/1999   SpO2 98%   BMI 24.17 kg/m     Wt Readings from Last 3 Encounters:  04/01/20 140 lb 12.8 oz (63.9 kg)  09/07/19 142 lb (64.4 kg)  12/20/17 150 lb (68 kg)     GEN:  Well nourished, well developed in no acute distress HEENT: Normal NECK: No JVD; No carotid bruits CARDIAC: Bradycaric, regular, no murmurs, rubs, gallops RESPIRATORY:  Clear to auscultation without rales, wheezing or rhonchi  ABDOMEN: Soft, non-tender, non-distended MUSCULOSKELETAL:  No edema; No deformity  SKIN: Warm and dry NEUROLOGIC:  Alert and oriented x 3 PSYCHIATRIC:  Normal affect   ASSESSMENT:    1. SSS (sick sinus syndrome) ([place])   2. Symptomatic bradycardia   3. Primary hypertension   4. Pure hypercholesterolemia    5. Mobitz type II atrioventricular block    PLAN:    In order of problems listed above:  #Symptomatic bradycardia #Suspected SSS: #History  of Mobitz II block on BB Patient with episodes of HR in 30s on propranolol (old medication for essential tremors) with associated fatigue consistent with symptomatic bradycardia. HR improved with lowering dose of propranolol but suspect patient does have underlying sick sinus syndrome. Has history of Mobitz II AVB in the setting of BB use in 2016, which resolved with stopping the medication.  -Refer to EP -Stop propranolol, however, do suspect she has underlying SSS as well -Will need alternative agent for tremors as patient's tremors significantly worse off the medication  #HTN Elevated since weaning off propranolol. -Start amlodipine 5mg  daily and up-titrate as needed -Continue home valsartan and lozol   #HLD Managed by PCP. -Continue home atorva 10mg  daily   Shared Decision Making/Informed Consent     Medication Adjustments/Labs and Tests Ordered: Current medicines are reviewed at length with the patient today.  Concerns regarding medicines are outlined above.  Orders Placed This Encounter  Procedures  . Ambulatory referral to Cardiac Electrophysiology  . EKG 12-Lead   Meds ordered this encounter  Medications  . amLODipine (NORVASC) 5 MG tablet    Sig: Take 1 tablet (5 mg total) by mouth daily.    Dispense:  90 tablet    Refill:  3    Patient Instructions  Medication Instructions:  Your physician has recommended you make the following change in your medication:   START: Amlodipine 5mg  daily STOP: Propranolol  *If you need a refill on your cardiac medications before your next appointment, please call your pharmacy*   Lab Work: None If you have labs (blood work) drawn today and your tests are completely normal, you will receive your results only by: Marland Kitchen MyChart Message (if you have MyChart) OR . A paper copy in the mail If  you have any lab test that is abnormal or we need to change your treatment, we will call you to review the results.  Follow-Up: At [facility], you and your health needs are our priority.  As part of our continuing mission to provide you with exceptional heart care, we have created designated Provider Care Teams.  These Care Teams include your primary Cardiologist (physician) and Advanced Practice Providers (APPs -  Physician Assistants and Nurse Practitioners) who all work together to provide you with the care you need, when you need it.  We recommend signing up for the patient portal called "MyChart".  Sign up information is provided on this After Visit Summary.  MyChart is used to connect with patients for Virtual Visits (Telemedicine).  Patients are able to view lab/test results, encounter notes, upcoming appointments, etc.  Non-urgent messages can be sent to your provider as well.   To learn more about what you can do with MyChart, go to [email or web address].    Your next appointment:   07/03/2020  The format for your next appointment:   In Person  Provider:   Gwyndolyn Kaufman, MD      Signed, Freada Bergeron, MD  04/01/2020 5:22 PM    [place]

## 2020-04-01 ENCOUNTER — Encounter: Payer: Self-pay | Admitting: Cardiology

## 2020-04-01 ENCOUNTER — Ambulatory Visit: Payer: Medicare PPO | Admitting: Cardiology

## 2020-04-01 ENCOUNTER — Other Ambulatory Visit: Payer: Self-pay

## 2020-04-01 VITALS — BP 158/90 | HR 58 | Ht 64.0 in | Wt 140.8 lb

## 2020-04-01 DIAGNOSIS — I441 Atrioventricular block, second degree: Secondary | ICD-10-CM

## 2020-04-01 DIAGNOSIS — E7800 Pure hypercholesterolemia, unspecified: Secondary | ICD-10-CM | POA: Diagnosis not present

## 2020-04-01 DIAGNOSIS — I10 Essential (primary) hypertension: Secondary | ICD-10-CM | POA: Diagnosis not present

## 2020-04-01 DIAGNOSIS — R001 Bradycardia, unspecified: Secondary | ICD-10-CM

## 2020-04-01 DIAGNOSIS — I495 Sick sinus syndrome: Secondary | ICD-10-CM | POA: Diagnosis not present

## 2020-04-01 MED ORDER — AMLODIPINE BESYLATE 5 MG PO TABS
5.0000 mg | ORAL_TABLET | Freq: Every day | ORAL | 3 refills | Status: DC
Start: 2020-04-01 — End: 2021-03-30

## 2020-04-01 NOTE — Patient Instructions (Addendum)
Medication Instructions:  Your physician has recommended you make the following change in your medication:   START: Amlodipine 5mg  daily STOP: Propranolol  *If you need a refill on your cardiac medications before your next appointment, please call your pharmacy*   Lab Work: None If you have labs (blood work) drawn today and your tests are completely normal, you will receive your results only by: Marland Kitchen MyChart Message (if you have MyChart) OR . A paper copy in the mail If you have any lab test that is abnormal or we need to change your treatment, we will call you to review the results.  Follow-Up: At [facility], you and your health needs are our priority.  As part of our continuing mission to provide you with exceptional heart care, we have created designated Provider Care Teams.  These Care Teams include your primary Cardiologist (physician) and Advanced Practice Providers (APPs -  Physician Assistants and Nurse Practitioners) who all work together to provide you with the care you need, when you need it.  We recommend signing up for the patient portal called "MyChart".  Sign up information is provided on this After Visit Summary.  MyChart is used to connect with patients for Virtual Visits (Telemedicine).  Patients are able to view lab/test results, encounter notes, upcoming appointments, etc.  Non-urgent messages can be sent to your provider as well.   To learn more about what you can do with MyChart, go to [email or web address].    Your next appointment:   07/03/2020  The format for your next appointment:   In Person  Provider:   Gwyndolyn Kaufman, MD

## 2020-04-04 ENCOUNTER — Other Ambulatory Visit: Payer: Self-pay

## 2020-04-04 ENCOUNTER — Encounter: Payer: Self-pay | Admitting: Cardiology

## 2020-04-04 ENCOUNTER — Ambulatory Visit: Payer: Medicare PPO | Admitting: Cardiology

## 2020-04-04 VITALS — BP 122/80 | HR 124 | Ht 64.0 in | Wt 142.8 lb

## 2020-04-04 DIAGNOSIS — G250 Essential tremor: Secondary | ICD-10-CM

## 2020-04-04 DIAGNOSIS — I495 Sick sinus syndrome: Secondary | ICD-10-CM | POA: Insufficient documentation

## 2020-04-04 DIAGNOSIS — I10 Essential (primary) hypertension: Secondary | ICD-10-CM | POA: Diagnosis not present

## 2020-04-04 DIAGNOSIS — I441 Atrioventricular block, second degree: Secondary | ICD-10-CM | POA: Diagnosis not present

## 2020-04-04 NOTE — Progress Notes (Signed)
Electrophysiology Office Note:    Date:  04/04/2020   ID:  Michelle Fuller, DOB 03-18-1935, MRN [id number]  PCP:  Lorne Skeens, MD  [facility] Cardiologist:  No primary care provider on file.  [facility] Electrophysiologist:  None   Referring MD: Freada Bergeron, MD   Chief Complaint: Symptomatic bradycardia  History of Present Illness:    Michelle Fuller is a 85 y.o. female who presents for an evaluation of symptomatic bradycardia at the request of Dr. Johney Frame. Their medical history includes essential tremor previously treated with propranolol, breast cancer post chemo and radiation, hypertension, hyperlipidemia. She last saw Dr. Johney Frame on 04/01/2020. Her history of bradycardia and AV nodal disease dates back to 2016 when she was admitted for chest pain.  During the hospitalization she was found to be in a Mobitz type II second-degree heart block.  Propranolol was decreased at that time and she was discharged with an outpatient heart monitor.  She keeps a close eye on her blood pressure and heart rate as an outpatient and recently noted her heart rate to have decreased into the 30s.  She was fatigued.  No syncope or presyncope.  She went to her primary care for this who decreased the propranolol.  The heart rate improved but her essential tremors significantly worsened. Today she tells me her tremor is so severe that she is unable to to sign in at our clinic today.  These tremors severely limit her daily activities.  She was started on primidone in an attempt to treat the tremors without propranolol but it has not been as effective as propranolol.     Past Medical History:  Diagnosis Date  . Abnormal Pap smear of cervix   . Breast cancer ([place])    Right breast, status post XRT  . Cataracts, both eyes   . Chest pain 2015   recent episode while in sun- recovered in shade with water  . Colon polyp   . Depression   . Diverticulitis   . Diverticulosis   .  Elevated LDL cholesterol level   . Essential tremor   . Goiter    multinodular  . Hyperlipidemia   . Hypertension   . Osteopenia 8/97   Spine  . Ovarian cyst    found on CT scan  . Post-menopausal bleeding 01/2000   Huge polyps exc  . Syncope   . Vitamin D deficiency     Past Surgical History:  Procedure Laterality Date  . Bilateral cateract surgery  2014  . BREAST LUMPECTOMY Right 4/96  . HYSTEROSCOPY  0/01  . LAPAROSCOPIC BILATERAL SALPINGO OOPHERECTOMY Bilateral 01/07/2014   Procedure: LAPAROSCOPIC BILATERAL SALPINGO OOPHORECTOMY;  Surgeon: Lyman Speller, MD;  Location: [facility];  Service: Gynecology;  Laterality: Bilateral;    Current Medications: Current Meds  Medication Sig  . amLODipine (NORVASC) 5 MG tablet Take 1 tablet (5 mg total) by mouth daily.  Marland Kitchen aspirin 81 MG tablet Take 1 tablet (81 mg total) by mouth daily.  Marland Kitchen atorvastatin (LIPITOR) 10 MG tablet Take 10 mg by mouth every morning.   . Calcium Citrate-Vitamin D (CITRACAL PETITES/VITAMIN D PO) Take 2 tablets by mouth 2 (two) times daily.  . carboxymethylcellulose (REFRESH PLUS) 0.5 % SOLN Place 2 drops daily as needed into both eyes.  . ciprofloxacin (CIPRO) 250 MG tablet Take 1 tablet by mouth twice a day for 3 days for UTI  . clonazePAM (KLONOPIN) 0.5 MG tablet Take 0.5 mg by mouth 2 (two) times  daily as needed for anxiety.   Marland Kitchen FINACEA 15 % cream Apply 1 application topically at bedtime.   . Flaxseed, Linseed, (SM FLAX SEED OIL) 1000 MG CAPS Take 1,000 mg by mouth every morning.   . indapamide (LOZOL) 1.25 MG tablet Take 1.25 mg by mouth every morning.   . Lactobacillus Rhamnosus, GG, (CULTURELLE PO) Take 1 capsule by mouth every morning.   . Multiple Vitamin (MULTIVITAMIN WITH MINERALS) TABS tablet Take 1 tablet by mouth every morning.  Marland Kitchen omeprazole (PRILOSEC) 20 MG capsule Take 1 capsule daily for 2 weeks every 4 months  . polyethylene glycol powder (GLYCOLAX/MIRALAX) powder Take 17 g by mouth daily as  needed.  . primidone (MYSOLINE) 50 MG tablet Take 3 tablets (150 mg total) by mouth daily with breakfast.  . valsartan (DIOVAN) 320 MG tablet Take 320 mg by mouth every morning.      Allergies:   Clonidine, Hydrocodone-acetaminophen, Latex, Shingrix [zoster vac recomb adjuvanted], Flagyl [metronidazole], Penicillins, and Sulfamethoxazole-trimethoprim   Social History   Socioeconomic History  . Marital status: Widowed    Spouse name: Not on file  . Number of children: 3  . Years of education: BA  . Highest education level: Not on file  Occupational History  . Occupation: Retired Product manager: RETIRED  Tobacco Use  . Smoking status: Never Smoker  . Smokeless tobacco: Never Used  Substance and Sexual Activity  . Alcohol use: No    Alcohol/week: 0.0 standard drinks  . Drug use: No  . Sexual activity: Not Currently    Partners: Male  Other Topics Concern  . Not on file  Social History Narrative   Widowed.  Lives alone.  Ambulates without assistance.   Drinks about 1 cup of green tea a day    Social Determinants of Health   Financial Resource Strain:   . Difficulty of Paying Living Expenses: Not on file  Food Insecurity:   . Worried About Charity fundraiser in the Last Year: Not on file  . Ran Out of Food in the Last Year: Not on file  Transportation Needs:   . Lack of Transportation (Medical): Not on file  . Lack of Transportation (Non-Medical): Not on file  Physical Activity:   . Days of Exercise per Week: Not on file  . Minutes of Exercise per Session: Not on file  Stress:   . Feeling of Stress : Not on file  Social Connections:   . Frequency of Communication with Friends and Family: Not on file  . Frequency of Social Gatherings with Friends and Family: Not on file  . Attends Religious Services: Not on file  . Active Member of Clubs or Organizations: Not on file  . Attends Archivist Meetings: Not on file  . Marital Status: Not on file      Family History: The patient's family history includes Alzheimer's disease in her father; Heart attack in her mother; Heart disease in her mother; Stroke in her maternal grandmother; Thyroid disease in her maternal grandmother and mother.  ROS:   Please see the history of present illness.    All other systems reviewed and are negative.  EKGs/Labs/Other Studies Reviewed:    The following studies were reviewed today: Prior notes, prior ECGs  EKG:  The ekg ordered today demonstrates 2-1 AV block with a ventricular rate of 50 bpm.  Conducted P waves have a PR interval of 240 ms.  The QRS is narrow at 100 ms.  Recent Labs: No results found for requested labs within last 8760 hours.  Recent Lipid Panel    Component Value Date/Time   CHOL 154 01/16/2013 0356   TRIG 197 (H) 01/16/2013 0356   HDL 36 (L) 01/16/2013 0356   CHOLHDL 4.3 01/16/2013 0356   VLDL 39 01/16/2013 0356   LDLCALC 79 01/16/2013 0356    Physical Exam:    VS:  BP 122/80   Pulse (!) 124   Ht 5\' 4"  (1.626 m)   Wt 142 lb 12.8 oz (64.8 kg)   LMP 05/25/1999   SpO2 95%   BMI 24.51 kg/m     Wt Readings from Last 3 Encounters:  04/04/20 142 lb 12.8 oz (64.8 kg)  04/01/20 140 lb 12.8 oz (63.9 kg)  09/07/19 142 lb (64.4 kg)     GEN: Well nourished, well developed in no acute distress HEENT: Normal NECK: No JVD; No carotid bruits LYMPHATICS: No lymphadenopathy CARDIAC: Bradycardic, no murmurs, rubs, gallops RESPIRATORY:  Clear to auscultation without rales, wheezing or rhonchi  ABDOMEN: Soft, non-tender, non-distended MUSCULOSKELETAL:  No edema; No deformity  SKIN: Warm and dry NEUROLOGIC:  Alert and oriented x 3.  Severe tremors of bilateral upper extremities, head, legs. PSYCHIATRIC:  Normal affect   ASSESSMENT:    1. Essential tremor   2. AV block, 2nd degree   3. Primary hypertension    PLAN:    In order of problems listed above:  1. Second-degree AV block, 2-1 AV block Patient has evidence of  conduction system disease with an incomplete right bundle branch block, first-degree AV delay, left anterior fascicular block.  Given her conduction system disease and bradycardia, her propranolol has been discontinued.  The propranolol was very effective in treating her essential tremor which significantly impacts her quality of life.  Despite being off propranolol she continues to be bradycardic with 2-1 heart block on today's EKG with symptoms of fatigue.  I believe that she would benefit greatly from a dual-chamber permanent pacemaker.  This would allow [place] to give her a higher resting heart rate and also restart her propranolol to control her essential tremor.  It also is better at controlling her blood pressures.  Plan will be to implant a dual-chamber permanent pacemaker.  After the pacemaker is implanted we will restart propranolol and discontinue amlodipine.  The pacemaker implant procedure was discussed in detail with the patient during today's visit including the risks and expected recovery time.  She wishes to proceed.  Risks, benefits, alternatives to PPM implantation were discussed in detail with the patient today. The patient understands that the risks include but are not limited to bleeding, infection, pneumothorax, perforation, tamponade, vascular damage, renal failure, MI, stroke, death, and lead dislodgement and wishes to proceed.  We will therefore schedule device implantation at the next available time.  2.  Essential tremor Plan to restart propranolol and stop primidone after dual-chamber permanent pacemaker implant.  3.  Hypertension Controlled at today's visit.  Plan to stop amlodipine and restart propranolol after dual-chamber permanent pacemaker is implanted.  Medication Adjustments/Labs and Tests Ordered: Current medicines are reviewed at length with the patient today.  Concerns regarding medicines are outlined above.  Orders Placed This Encounter  Procedures  . Basic  Metabolic Panel (BMET)  . CBC w/Diff  . ECHOCARDIOGRAM COMPLETE   No orders of the defined types were placed in this encounter.    Signed, Lars Mage, MD, [facility]  04/04/2020 5:32 PM    Electrophysiology [place]  [facility] 

## 2020-04-04 NOTE — Telephone Encounter (Signed)
Error

## 2020-04-04 NOTE — Patient Instructions (Addendum)
Medication Instructions:  Your physician recommends that you continue on your current medications as directed. Please refer to the Current Medication list given to you today. *If you need a refill on your cardiac medications before your next appointment, please call your pharmacy*  Lab Work: None ordered. If you have labs (blood work) drawn today and your tests are completely normal, you will receive your results only by: Marland Kitchen MyChart Message (if you have MyChart) OR . A paper copy in the mail If you have any lab test that is abnormal or we need to change your treatment, we will call you to review the results.  Testing/Procedures: Your physician has requested that you have an echocardiogram. Echocardiography is a painless test that uses sound waves to create images of your heart. It provides your doctor with information about the size and shape of your heart and how well your heart's chambers and valves are working. This procedure takes approximately one hour. There are no restrictions for this procedure.  Please schedule for ECHO  Follow-Up:  SEE INSTRUCTION LETTER    Pacemaker Implantation, Adult Pacemaker implantation is a procedure to place a pacemaker inside your chest. A pacemaker is a small computer that sends electrical signals to the heart and helps your heart beat normally. A pacemaker also stores information about your heart rhythms. You may need pacemaker implantation if you:  Have a slow heartbeat (bradycardia).  Faint (syncope).  Have shortness of breath (dyspnea) due to heart problems. The pacemaker attaches to your heart through a wire, called a lead. Sometimes just one lead is needed. Other times, there will be two leads. There are two types of pacemakers:  Transvenous pacemaker. This type is placed under the skin or muscle of your chest. The lead goes through a vein in the chest area to reach the inside of the heart.  Epicardial pacemaker. This type is placed under  the skin or muscle of your chest or belly. The lead goes through your chest to the outside of the heart. Tell a health care provider about:  Any allergies you have.  All medicines you are taking, including vitamins, herbs, eye drops, creams, and over-the-counter medicines.  Any problems you or family members have had with anesthetic medicines.  Any blood or bone disorders you have.  Any surgeries you have had.  Any medical conditions you have.  Whether you are pregnant or may be pregnant. What are the risks? Generally, this is a safe procedure. However, problems may occur, including:  Infection.  Bleeding.  Failure of the pacemaker or the lead.  Collapse of a lung or bleeding into a lung.  Blood clot inside a blood vessel with a lead.  Damage to the heart.  Infection inside the heart (endocarditis).  Allergic reactions to medicines. What happens before the procedure? Staying hydrated Follow instructions from your health care provider about hydration, which may include:  Up to 2 hours before the procedure - you may continue to drink clear liquids, such as water, clear fruit juice, black coffee, and plain tea. Eating and drinking restrictions Follow instructions from your health care provider about eating and drinking, which may include:  8 hours before the procedure - stop eating heavy meals or foods such as meat, fried foods, or fatty foods.  6 hours before the procedure - stop eating light meals or foods, such as toast or cereal.  6 hours before the procedure - stop drinking milk or drinks that contain milk.  2 hours before the  procedure - stop drinking clear liquids. Medicines  Ask your health care provider about: ? Changing or stopping your regular medicines. This is especially important if you are taking diabetes medicines or blood thinners. ? Taking medicines such as aspirin and ibuprofen. These medicines can thin your blood. Do not take these medicines  before your procedure if your health care provider instructs you not to.  You may be given antibiotic medicine to help prevent infection. General instructions  You will have a heart evaluation. This may include an electrocardiogram (ECG), chest X-ray, and heart imaging (echocardiogram,  or echo) tests.  You will have blood tests.  Do not use any products that contain nicotine or tobacco, such as cigarettes and e-cigarettes. If you need help quitting, ask your health care provider.  Plan to have someone take you home from the hospital or clinic.  If you will be going home right after the procedure, plan to have someone with you for 24 hours.  Ask your health care provider how your surgical site will be marked or identified. What happens during the procedure?  To reduce your risk of infection: ? Your health care team will wash or sanitize their hands. ? Your skin will be washed with soap. ? Hair may be removed from the surgical area.  An IV tube will be inserted into one of your veins.  You will be given one or more of the following: ? A medicine to help you relax (sedative). ? A medicine to numb the area (local anesthetic). ? A medicine to make you fall asleep (general anesthetic).  If you are getting a transvenous pacemaker: ? An incision will be made in your upper chest. ? A pocket will be made for the pacemaker. It may be placed under the skin or between layers of muscle. ? The lead will be inserted into a blood vessel that returns to the heart. ? While X-rays are taken by an imaging machine (fluoroscopy), the lead will be advanced through the vein to the inside of your heart. ? The other end of the lead will be tunneled under the skin and attached to the pacemaker.  If you are getting an epicardial pacemaker: ? An incision will be made near your ribs or breastbone (sternum) for the lead. ? The lead will be attached to the outside of your heart. ? Another incision will be  made in your chest or upper belly to create a pocket for the pacemaker. ? The free end of the lead will be tunneled under the skin and attached to the pacemaker.  The transvenous or epicardial pacemaker will be tested. Imaging studies may be done to check the lead position.  The incisions will be closed with stitches (sutures), adhesive strips, or skin glue.  Bandages (dressing) will be placed over the incisions. The procedure may vary among health care providers and hospitals. What happens after the procedure?  Your blood pressure, heart rate, breathing rate, and blood oxygen level will be monitored until the medicines you were given have worn off.  You will be given antibiotics and pain medicine.  ECG and chest x-rays will be done.  You will wear a continuous type of ECG (Holter monitor) to check your heart rhythm.  Your health care provider will program the pacemaker.  Do not drive for 24 hours if you received a sedative. This information is not intended to replace advice given to you by your health care provider. Make sure you discuss any questions you have  with your health care provider. Document Revised: 01/27/2018 Document Reviewed: 10/22/2015 Elsevier Patient Education  [place].

## 2020-04-23 ENCOUNTER — Telehealth: Payer: Self-pay | Admitting: *Deleted

## 2020-04-23 NOTE — Telephone Encounter (Signed)
Pt advised of scheduling change on 1/3 for her PPM implant. Aware to arrive at 10:30 am for her procedure (not 8:30 an as originally instructed). Patient verbalized understanding and agreeable to plan.

## 2020-05-01 ENCOUNTER — Ambulatory Visit (HOSPITAL_COMMUNITY): Payer: Medicare PPO | Attending: Cardiovascular Disease

## 2020-05-01 ENCOUNTER — Other Ambulatory Visit: Payer: Self-pay

## 2020-05-01 DIAGNOSIS — I441 Atrioventricular block, second degree: Secondary | ICD-10-CM | POA: Diagnosis not present

## 2020-05-01 LAB — ECHOCARDIOGRAM COMPLETE
Area-P 1/2: 8.25 cm2
P 1/2 time: 778 msec
S' Lateral: 2.6 cm

## 2020-05-20 ENCOUNTER — Other Ambulatory Visit: Payer: Medicare PPO | Admitting: *Deleted

## 2020-05-20 ENCOUNTER — Other Ambulatory Visit: Payer: Self-pay

## 2020-05-20 DIAGNOSIS — I441 Atrioventricular block, second degree: Secondary | ICD-10-CM

## 2020-05-21 ENCOUNTER — Telehealth: Payer: Self-pay

## 2020-05-21 LAB — CBC WITH DIFFERENTIAL/PLATELET
Basophils Absolute: 0.1 10*3/uL (ref 0.0–0.2)
Basos: 1 %
EOS (ABSOLUTE): 0.1 10*3/uL (ref 0.0–0.4)
Eos: 1 %
Hematocrit: 48.6 % — ABNORMAL HIGH (ref 34.0–46.6)
Hemoglobin: 16.2 g/dL — ABNORMAL HIGH (ref 11.1–15.9)
Immature Grans (Abs): 0 10*3/uL (ref 0.0–0.1)
Immature Granulocytes: 1 %
Lymphocytes Absolute: 1.3 10*3/uL (ref 0.7–3.1)
Lymphs: 20 %
MCH: 30.1 pg (ref 26.6–33.0)
MCHC: 33.3 g/dL (ref 31.5–35.7)
MCV: 90 fL (ref 79–97)
Monocytes Absolute: 1 10*3/uL — ABNORMAL HIGH (ref 0.1–0.9)
Monocytes: 16 %
Neutrophils Absolute: 4 10*3/uL (ref 1.4–7.0)
Neutrophils: 61 %
Platelets: 254 10*3/uL (ref 150–450)
RBC: 5.39 x10E6/uL — ABNORMAL HIGH (ref 3.77–5.28)
RDW: 12.5 % (ref 11.7–15.4)
WBC: 6.4 10*3/uL (ref 3.4–10.8)

## 2020-05-21 LAB — BASIC METABOLIC PANEL
BUN/Creatinine Ratio: 23 (ref 12–28)
BUN: 18 mg/dL (ref 8–27)
CO2: 27 mmol/L (ref 20–29)
Calcium: 10.1 mg/dL (ref 8.7–10.3)
Chloride: 95 mmol/L — ABNORMAL LOW (ref 96–106)
Creatinine, Ser: 0.79 mg/dL (ref 0.57–1.00)
GFR calc Af Amer: 80 mL/min/{1.73_m2} (ref >59)
GFR calc non Af Amer: 69 mL/min/{1.73_m2} (ref >59)
Glucose: 79 mg/dL (ref 65–99)
Potassium: 4.1 mmol/L (ref 3.5–5.2)
Sodium: 137 mmol/L (ref 134–144)

## 2020-05-21 NOTE — Telephone Encounter (Signed)
Outreach made to pt  Pt will arrive for pacemaker on January 3 at 7:30 am for 9:30 spot  Cath lab notified

## 2020-05-22 NOTE — Progress Notes (Signed)
Instructed patient on the following items: Arrival time 0730 Nothing to eat or drink after midnight No meds AM of procedure Responsible person to drive you home and stay with you for 24 hrs Wash with special soap night before and morning of procedure  

## 2020-05-23 ENCOUNTER — Other Ambulatory Visit (HOSPITAL_COMMUNITY)
Admission: RE | Admit: 2020-05-23 | Discharge: 2020-05-23 | Disposition: A | Discharge status: Home / Self Care | Payer: Medicare PPO | Source: Ambulatory Visit | Attending: Cardiology | Admitting: Cardiology

## 2020-05-23 DIAGNOSIS — Z20822 Contact with and (suspected) exposure to covid-19: Secondary | ICD-10-CM | POA: Diagnosis not present

## 2020-05-23 DIAGNOSIS — Z01812 Encounter for preprocedural laboratory examination: Secondary | ICD-10-CM | POA: Diagnosis present

## 2020-05-23 LAB — SARS CORONAVIRUS 2 (TAT 6-24 HRS): SARS Coronavirus 2: NEGATIVE

## 2020-05-26 ENCOUNTER — Encounter (HOSPITAL_COMMUNITY): Payer: Self-pay | Admitting: Cardiology

## 2020-05-26 ENCOUNTER — Other Ambulatory Visit: Payer: Self-pay

## 2020-05-26 ENCOUNTER — Ambulatory Visit (HOSPITAL_COMMUNITY)
Admission: RE | Admit: 2020-05-26 | Discharge: 2020-05-27 | Disposition: A | Discharge status: Home / Self Care | Payer: Medicare PPO | Attending: Cardiology | Admitting: Cardiology

## 2020-05-26 ENCOUNTER — Encounter (HOSPITAL_COMMUNITY)
Admission: RE | Disposition: A | Discharge status: Home / Self Care | Payer: Self-pay | Source: Home / Self Care | Attending: Cardiology

## 2020-05-26 DIAGNOSIS — Z7982 Long term (current) use of aspirin: Secondary | ICD-10-CM | POA: Diagnosis not present

## 2020-05-26 DIAGNOSIS — Z8249 Family history of ischemic heart disease and other diseases of the circulatory system: Secondary | ICD-10-CM | POA: Diagnosis not present

## 2020-05-26 DIAGNOSIS — Z880 Allergy status to penicillin: Secondary | ICD-10-CM | POA: Diagnosis not present

## 2020-05-26 DIAGNOSIS — I10 Essential (primary) hypertension: Secondary | ICD-10-CM | POA: Insufficient documentation

## 2020-05-26 DIAGNOSIS — Z881 Allergy status to other antibiotic agents status: Secondary | ICD-10-CM | POA: Insufficient documentation

## 2020-05-26 DIAGNOSIS — I441 Atrioventricular block, second degree: Secondary | ICD-10-CM | POA: Diagnosis not present

## 2020-05-26 DIAGNOSIS — Z950 Presence of cardiac pacemaker: Secondary | ICD-10-CM

## 2020-05-26 DIAGNOSIS — Z79899 Other long term (current) drug therapy: Secondary | ICD-10-CM | POA: Diagnosis not present

## 2020-05-26 DIAGNOSIS — R001 Bradycardia, unspecified: Secondary | ICD-10-CM | POA: Diagnosis present

## 2020-05-26 DIAGNOSIS — G250 Essential tremor: Secondary | ICD-10-CM | POA: Diagnosis not present

## 2020-05-26 DIAGNOSIS — Z885 Allergy status to narcotic agent status: Secondary | ICD-10-CM | POA: Diagnosis not present

## 2020-05-26 DIAGNOSIS — E785 Hyperlipidemia, unspecified: Secondary | ICD-10-CM | POA: Insufficient documentation

## 2020-05-26 HISTORY — PX: PACEMAKER IMPLANT: EP1218

## 2020-05-26 SURGERY — PACEMAKER IMPLANT

## 2020-05-26 MED ORDER — FENTANYL CITRATE (PF) 100 MCG/2ML IJ SOLN
INTRAMUSCULAR | Status: AC
  Filled 2020-05-26: qty 2

## 2020-05-26 MED ORDER — LIDOCAINE HCL (PF) 1 % IJ SOLN
INTRAMUSCULAR | Status: DC | PRN
  Administered 2020-05-26: 40 mL

## 2020-05-26 MED ORDER — MIDAZOLAM HCL 5 MG/5ML IJ SOLN
INTRAMUSCULAR | Status: AC
  Filled 2020-05-26: qty 5

## 2020-05-26 MED ORDER — PRIMIDONE 50 MG PO TABS
150.0000 mg | ORAL_TABLET | Freq: Every day | ORAL | Status: DC
  Administered 2020-05-27: 150 mg via ORAL
  Filled 2020-05-26: qty 3

## 2020-05-26 MED ORDER — IOHEXOL 350 MG/ML SOLN
INTRAVENOUS | Status: DC | PRN
  Administered 2020-05-26: 15 mL

## 2020-05-26 MED ORDER — HEPARIN (PORCINE) IN NACL 1000-0.9 UT/500ML-% IV SOLN
INTRAVENOUS | Status: AC
  Filled 2020-05-26: qty 500

## 2020-05-26 MED ORDER — HYDRALAZINE HCL 20 MG/ML IJ SOLN
INTRAMUSCULAR | Status: DC | PRN
  Administered 2020-05-26 (×2): 5 mg via INTRAVENOUS

## 2020-05-26 MED ORDER — SODIUM CHLORIDE 0.9 % IV SOLN
INTRAVENOUS | Status: AC
  Filled 2020-05-26: qty 2

## 2020-05-26 MED ORDER — PANTOPRAZOLE SODIUM 40 MG PO TBEC
40.0000 mg | DELAYED_RELEASE_TABLET | Freq: Every day | ORAL | Status: DC
  Administered 2020-05-26 – 2020-05-27 (×2): 40 mg via ORAL
  Filled 2020-05-26 (×3): qty 1

## 2020-05-26 MED ORDER — LIDOCAINE HCL (PF) 1 % IJ SOLN
INTRAMUSCULAR | Status: AC
  Filled 2020-05-26: qty 90

## 2020-05-26 MED ORDER — ACETAMINOPHEN 325 MG PO TABS
325.0000 mg | ORAL_TABLET | ORAL | Status: DC | PRN
  Administered 2020-05-27: 650 mg via ORAL
  Filled 2020-05-26: qty 2

## 2020-05-26 MED ORDER — ONDANSETRON HCL 4 MG/2ML IJ SOLN
4.0000 mg | Freq: Four times a day (QID) | INTRAMUSCULAR | Status: DC | PRN
  Administered 2020-05-26 – 2020-05-27 (×2): 4 mg via INTRAVENOUS
  Filled 2020-05-26 (×2): qty 2

## 2020-05-26 MED ORDER — SODIUM CHLORIDE 0.9 % IV SOLN: INTRAVENOUS | Status: DC

## 2020-05-26 MED ORDER — IRBESARTAN 300 MG PO TABS
300.0000 mg | ORAL_TABLET | Freq: Every day | ORAL | Status: DC
  Administered 2020-05-27: 300 mg via ORAL
  Filled 2020-05-26: qty 1

## 2020-05-26 MED ORDER — HYDRALAZINE HCL 20 MG/ML IJ SOLN
INTRAMUSCULAR | Status: AC
  Filled 2020-05-26: qty 1

## 2020-05-26 MED ORDER — INDAPAMIDE 1.25 MG PO TABS
1.2500 mg | ORAL_TABLET | Freq: Every morning | ORAL | Status: DC
  Administered 2020-05-27: 1.25 mg via ORAL
  Filled 2020-05-26: qty 1

## 2020-05-26 MED ORDER — HEPARIN (PORCINE) IN NACL 1000-0.9 UT/500ML-% IV SOLN
INTRAVENOUS | Status: DC | PRN
  Administered 2020-05-26: 500 mL

## 2020-05-26 MED ORDER — PROPRANOLOL HCL 20 MG PO TABS
20.0000 mg | ORAL_TABLET | Freq: Two times a day (BID) | ORAL | Status: DC
  Administered 2020-05-26: 20 mg via ORAL
  Filled 2020-05-26 (×2): qty 1

## 2020-05-26 MED ORDER — CHLORHEXIDINE GLUCONATE 4 % EX LIQD
4.0000 "application " | Freq: Once | CUTANEOUS | Status: DC
  Filled 2020-05-26: qty 60

## 2020-05-26 MED ORDER — VANCOMYCIN HCL IN DEXTROSE 1-5 GM/200ML-% IV SOLN
INTRAVENOUS | Status: AC
  Filled 2020-05-26: qty 200

## 2020-05-26 MED ORDER — ATORVASTATIN CALCIUM 10 MG PO TABS
10.0000 mg | ORAL_TABLET | Freq: Every morning | ORAL | Status: DC
  Administered 2020-05-27: 10 mg via ORAL
  Filled 2020-05-26: qty 1

## 2020-05-26 MED ORDER — MIDAZOLAM HCL 5 MG/5ML IJ SOLN
INTRAMUSCULAR | Status: DC | PRN
  Administered 2020-05-26 (×2): 1 mg via INTRAVENOUS

## 2020-05-26 MED ORDER — VANCOMYCIN HCL IN DEXTROSE 1-5 GM/200ML-% IV SOLN
1000.0000 mg | INTRAVENOUS | Status: AC
  Administered 2020-05-26: 1000 mg via INTRAVENOUS

## 2020-05-26 MED ORDER — HYDRALAZINE HCL 25 MG PO TABS
25.0000 mg | ORAL_TABLET | Freq: Four times a day (QID) | ORAL | Status: DC | PRN
  Administered 2020-05-27: 25 mg via ORAL
  Filled 2020-05-26 (×2): qty 1

## 2020-05-26 MED ORDER — SODIUM CHLORIDE 0.9 % IV SOLN
80.0000 mg | INTRAVENOUS | Status: AC
  Administered 2020-05-26: 80 mg

## 2020-05-26 MED ORDER — FENTANYL CITRATE (PF) 100 MCG/2ML IJ SOLN
INTRAMUSCULAR | Status: DC | PRN
  Administered 2020-05-26 (×2): 25 ug via INTRAVENOUS

## 2020-05-26 MED ORDER — AMLODIPINE BESYLATE 5 MG PO TABS
5.0000 mg | ORAL_TABLET | Freq: Every day | ORAL | Status: DC
  Administered 2020-05-27: 5 mg via ORAL
  Filled 2020-05-26: qty 1

## 2020-05-26 MED ORDER — CEFAZOLIN SODIUM-DEXTROSE 2-4 GM/100ML-% IV SOLN
INTRAVENOUS | Status: AC
  Filled 2020-05-26: qty 100

## 2020-05-26 SURGICAL SUPPLY — 8 items
CABLE SURGICAL S-101-97-12 (CABLE) ×2 IMPLANT
KIT MICROPUNCTURE NIT STIFF (SHEATH) ×1 IMPLANT
LEAD TENDRIL MRI 46CM LPA1200M (Lead) ×1 IMPLANT
LEAD TENDRIL MRI 52CM LPA1200M (Lead) ×1 IMPLANT
PACEMAKER ASSURITY DR-RF (Pacemaker) ×1 IMPLANT
PAD PRO RADIOLUCENT 2001M-C (PAD) ×2 IMPLANT
SHEATH 8FR PRELUDE SNAP 13 (SHEATH) ×2 IMPLANT
TRAY PACEMAKER INSERTION (PACKS) ×2 IMPLANT

## 2020-05-26 NOTE — H&P (Signed)
Electrophysiology Office Note:    Date:  04/04/2020   ID:  Michelle Fuller, DOB 08/30/1934, MRN [id number]  PCP:  Hali Marry, MD        [facility] Cardiologist:  No primary care provider on file.  [facility] Electrophysiologist:  None   Referring MD: Meriam Sprague, MD   Chief Complaint: Symptomatic bradycardia  History of Present Illness:    Michelle Fuller is a 86 y.o. female who presents for an evaluation of symptomatic bradycardia at the request of Dr. Shari Prows. Their medical history includes essential tremor previously treated with propranolol, breast cancer post chemo and radiation, hypertension, hyperlipidemia. She last saw Dr. Shari Prows on 04/01/2020. Her history of bradycardia and AV nodal disease dates back to 2016 when she was admitted for chest pain.  During the hospitalization she was found to be in a Mobitz type II second-degree heart block.  Propranolol was decreased at that time and she was discharged with an outpatient heart monitor.  She keeps a close eye on her blood pressure and heart rate as an outpatient and recently noted her heart rate to have decreased into the 30s.  She was fatigued.  No syncope or presyncope.  She went to her primary care for this who decreased the propranolol.  The heart rate improved but her essential tremors significantly worsened. Today she tells me her tremor is so severe that she is unable to to sign in at our clinic today.  These tremors severely limit her daily activities.  She was started on primidone in an attempt to treat the tremors without propranolol but it has not been as effective as propranolol.         Past Medical History:  Diagnosis Date  . Abnormal Pap smear of cervix   . Breast cancer (HCC)    Right breast, status post XRT  . Cataracts, both eyes   . Chest pain 2015   recent episode while in sun- recovered in shade with water  . Colon polyp   . Depression   .  Diverticulitis   . Diverticulosis   . Elevated LDL cholesterol level   . Essential tremor   . Goiter    multinodular  . Hyperlipidemia   . Hypertension   . Osteopenia 8/97   Spine  . Ovarian cyst    found on CT scan  . Post-menopausal bleeding 01/2000   Huge polyps exc  . Syncope   . Vitamin D deficiency          Past Surgical History:  Procedure Laterality Date  . Bilateral cateract surgery  2014  . BREAST LUMPECTOMY Right 4/96  . HYSTEROSCOPY  0/01  . LAPAROSCOPIC BILATERAL SALPINGO OOPHERECTOMY Bilateral 01/07/2014   Procedure: LAPAROSCOPIC BILATERAL SALPINGO OOPHORECTOMY;  Surgeon: Annamaria Boots, MD;  Location: [facility];  Service: Gynecology;  Laterality: Bilateral;    Current Medications: Active Medications      Current Meds  Medication Sig  . amLODipine (NORVASC) 5 MG tablet Take 1 tablet (5 mg total) by mouth daily.  Marland Kitchen aspirin 81 MG tablet Take 1 tablet (81 mg total) by mouth daily.  Marland Kitchen atorvastatin (LIPITOR) 10 MG tablet Take 10 mg by mouth every morning.   . Calcium Citrate-Vitamin D (CITRACAL PETITES/VITAMIN D PO) Take 2 tablets by mouth 2 (two) times daily.  . carboxymethylcellulose (REFRESH PLUS) 0.5 % SOLN Place 2 drops daily as needed into both eyes.  . ciprofloxacin (CIPRO) 250 MG tablet Take 1 tablet by mouth twice  a day for 3 days for UTI  . clonazePAM (KLONOPIN) 0.5 MG tablet Take 0.5 mg by mouth 2 (two) times daily as needed for anxiety.   Marland Kitchen FINACEA 15 % cream Apply 1 application topically at bedtime.   . Flaxseed, Linseed, (SM FLAX SEED OIL) 1000 MG CAPS Take 1,000 mg by mouth every morning.   . indapamide (LOZOL) 1.25 MG tablet Take 1.25 mg by mouth every morning.   . Lactobacillus Rhamnosus, GG, (CULTURELLE PO) Take 1 capsule by mouth every morning.   . Multiple Vitamin (MULTIVITAMIN WITH MINERALS) TABS tablet Take 1 tablet by mouth every morning.  Marland Kitchen omeprazole (PRILOSEC) 20 MG capsule Take 1 capsule daily for 2 weeks every 4  months  . polyethylene glycol powder (GLYCOLAX/MIRALAX) powder Take 17 g by mouth daily as needed.  . primidone (MYSOLINE) 50 MG tablet Take 3 tablets (150 mg total) by mouth daily with breakfast.  . valsartan (DIOVAN) 320 MG tablet Take 320 mg by mouth every morning.        Allergies:   Clonidine, Hydrocodone-acetaminophen, Latex, Shingrix [zoster vac recomb adjuvanted], Flagyl [metronidazole], Penicillins, and Sulfamethoxazole-trimethoprim   Social History        Socioeconomic History  . Marital status: Widowed    Spouse name: Not on file  . Number of children: 3  . Years of education: BA  . Highest education level: Not on file  Occupational History  . Occupation: Retired Product manager: RETIRED  Tobacco Use  . Smoking status: Never Smoker  . Smokeless tobacco: Never Used  Substance and Sexual Activity  . Alcohol use: No    Alcohol/week: 0.0 standard drinks  . Drug use: No  . Sexual activity: Not Currently    Partners: Male  Other Topics Concern  . Not on file  Social History Narrative   Widowed.  Lives alone.  Ambulates without assistance.   Drinks about 1 cup of green tea a day    Social Determinants of Health      Financial Resource Strain:   . Difficulty of Paying Living Expenses: Not on file  Food Insecurity:   . Worried About Charity fundraiser in the Last Year: Not on file  . Ran Out of Food in the Last Year: Not on file  Transportation Needs:   . Lack of Transportation (Medical): Not on file  . Lack of Transportation (Non-Medical): Not on file  Physical Activity:   . Days of Exercise per Week: Not on file  . Minutes of Exercise per Session: Not on file  Stress:   . Feeling of Stress : Not on file  Social Connections:   . Frequency of Communication with Friends and Family: Not on file  . Frequency of Social Gatherings with Friends and Family: Not on file  . Attends Religious Services: Not on file  . Active Member of Clubs or  Organizations: Not on file  . Attends Archivist Meetings: Not on file  . Marital Status: Not on file     Family History: The patient's family history includes Alzheimer's disease in her father; Heart attack in her mother; Heart disease in her mother; Stroke in her maternal grandmother; Thyroid disease in her maternal grandmother and mother.  ROS:   Please see the history of present illness.    All other systems reviewed and are negative.  EKGs/Labs/Other Studies Reviewed:    The following studies were reviewed today: Prior notes, prior ECGs  EKG:  The ekg ordered  today demonstrates 2-1 AV block with a ventricular rate of 50 bpm.  Conducted P waves have a PR interval of 240 ms.  The QRS is narrow at 100 ms.  Recent Labs: No results found for requested labs within last 8760 hours.  Recent Lipid Panel Labs (Brief)          Component Value Date/Time   CHOL 154 01/16/2013 0356   TRIG 197 (H) 01/16/2013 0356   HDL 36 (L) 01/16/2013 0356   CHOLHDL 4.3 01/16/2013 0356   VLDL 39 01/16/2013 0356   LDLCALC 79 01/16/2013 0356      Physical Exam:    VS:  BP 122/80   Pulse (!) 124   Ht 5\' 4"  (1.626 m)   Wt 142 lb 12.8 oz (64.8 kg)   LMP 05/25/1999   SpO2 95%   BMI 24.51 kg/m        Wt Readings from Last 3 Encounters:  04/04/20 142 lb 12.8 oz (64.8 kg)  04/01/20 140 lb 12.8 oz (63.9 kg)  09/07/19 142 lb (64.4 kg)     GEN: Well nourished, well developed in no acute distress HEENT: Normal NECK: No JVD; No carotid bruits LYMPHATICS: No lymphadenopathy CARDIAC: Bradycardic, no murmurs, rubs, gallops RESPIRATORY:  Clear to auscultation without rales, wheezing or rhonchi  ABDOMEN: Soft, non-tender, non-distended MUSCULOSKELETAL:  No edema; No deformity  SKIN: Warm and dry NEUROLOGIC:  Alert and oriented x 3.  Severe tremors of bilateral upper extremities, head, legs. PSYCHIATRIC:  Normal affect   ASSESSMENT:    1. Essential tremor   2. AV  block, 2nd degree   3. Primary hypertension    PLAN:    In order of problems listed above:  1. Second-degree AV block, 2-1 AV block Patient has evidence of conduction system disease with an incomplete right bundle branch block, first-degree AV delay, left anterior fascicular block.  Given her conduction system disease and bradycardia, her propranolol has been discontinued.  The propranolol was very effective in treating her essential tremor which significantly impacts her quality of life.  Despite being off propranolol she continues to be bradycardic with 2-1 heart block on today's EKG with symptoms of fatigue.  I believe that she would benefit greatly from a dual-chamber permanent pacemaker.  This would allow [place] to give her a higher resting heart rate and also restart her propranolol to control her essential tremor.  It also is better at controlling her blood pressures.  Plan will be to implant a dual-chamber permanent pacemaker.  After the pacemaker is implanted we will restart propranolol and discontinue amlodipine.  The pacemaker implant procedure was discussed in detail with the patient during today's visit including the risks and expected recovery time.  She wishes to proceed.  Risks, benefits, alternatives to PPM implantation were discussed in detail with the patient today. The patient understands that the risks include but are not limited to bleeding, infection, pneumothorax, perforation, tamponade, vascular damage, renal failure, MI, stroke, death, and lead dislodgement and wishes to proceed.  We will therefore schedule device implantation at the next available time.  2.  Essential tremor Plan to restart propranolol and stop primidone after dual-chamber permanent pacemaker implant.  3.  Hypertension Controlled at today's visit.  Plan to stop amlodipine and restart propranolol after dual-chamber permanent pacemaker is  implanted.     ------------------------------------------------------------------------- I have seen, examined the patient, and reviewed the above assessment and plan.    Plan for PPM today.   Vickie Epley, MD  05/26/2020 10:50 AM

## 2020-05-26 NOTE — Progress Notes (Signed)
Patient arrived on unit, telemetry initiated, vital signs stable call bell within reach.

## 2020-05-26 NOTE — Progress Notes (Signed)
Paged regarding resting hypertension overnight.  S/p dual chamber PPM for high grade AV block, inability to tolerate BB with bothersome essential tremor.  Start propranolol and can titrate up now that she has permanent pacing in place.  Luanna Salk, MD Cardiology Fellow [facility]

## 2020-05-27 ENCOUNTER — Encounter (HOSPITAL_COMMUNITY): Payer: Self-pay | Admitting: Cardiology

## 2020-05-27 ENCOUNTER — Ambulatory Visit (HOSPITAL_COMMUNITY): Payer: Medicare PPO

## 2020-05-27 DIAGNOSIS — I10 Essential (primary) hypertension: Secondary | ICD-10-CM | POA: Diagnosis not present

## 2020-05-27 DIAGNOSIS — Z8249 Family history of ischemic heart disease and other diseases of the circulatory system: Secondary | ICD-10-CM | POA: Diagnosis not present

## 2020-05-27 DIAGNOSIS — G250 Essential tremor: Secondary | ICD-10-CM | POA: Diagnosis not present

## 2020-05-27 DIAGNOSIS — R001 Bradycardia, unspecified: Secondary | ICD-10-CM

## 2020-05-27 DIAGNOSIS — I441 Atrioventricular block, second degree: Secondary | ICD-10-CM | POA: Diagnosis not present

## 2020-05-27 MED ORDER — PROPRANOLOL HCL 40 MG PO TABS: 40.0000 mg | ORAL_TABLET | Freq: Two times a day (BID) | ORAL | 6 refills | Status: DC

## 2020-05-27 MED ORDER — PROPRANOLOL HCL 40 MG PO TABS
40.0000 mg | ORAL_TABLET | Freq: Two times a day (BID) | ORAL | Status: DC
  Administered 2020-05-27: 40 mg via ORAL
  Filled 2020-05-27: qty 1

## 2020-05-27 MED ORDER — HYDRALAZINE HCL 25 MG PO TABS
25.0000 mg | ORAL_TABLET | Freq: Once | ORAL | Status: AC
  Administered 2020-05-27: 25 mg via ORAL
  Filled 2020-05-27: qty 1

## 2020-05-27 NOTE — Progress Notes (Signed)
Patient remains hypertensive 170/73 MAP (100). Pt refusing prn hydralazine states her blood pressure is within her normal parameters.   Will continue to monitor.

## 2020-05-27 NOTE — Progress Notes (Signed)
Patient remains hypertensive, BP 202/94 (117) after hydralazine administration.  Page sent to N[place], MD

## 2020-05-27 NOTE — Progress Notes (Signed)
D/C instructions given to patient. Wound care and medications reviewed. All questions answered. Lifting restrictions for left arm reviewed. IV removed, clean and intact. Son to escort pt home with all belongings.  Versie Starks, RN

## 2020-05-27 NOTE — Discharge Summary (Signed)
ELECTROPHYSIOLOGY PROCEDURE DISCHARGE SUMMARY    Patient ID: Michelle Fuller,  MRN: [id number], DOB/AGE: 84-Feb-1936 84 y.o.  Admit date: 05/26/2020 Discharge date: 05/27/2020  Primary Care Physician: Lorne Skeens, MD  Primary Cardiologist: Dr. Johney Frame Electrophysiologist: Dr. Quentin Ore  Primary Discharge Diagnosis:  1. Symptomatic bradycardia, Mobitz II AV block status post pacemaker implantation this admission  Secondary Discharge Diagnosis:  1. Essential Tremor 2. HTN 3. HLD  Allergies  Allergen Reactions  . Clonidine Other (See Comments)    Fatigue drymouth  . Hydrocodone-Acetaminophen Nausea Only  . Latex Itching and Rash  . Flagyl [Metronidazole] Hives, Nausea And Vomiting, Nausea Only and Rash  . Penicillins Hives and Rash    Reaction: 2 year ago  . Shingrix [Zoster Vac Recomb Adjuvanted] Other (See Comments)    Chills, nausea, diarrhea, and lightheadedness after second dose  . Sulfamethoxazole-Trimethoprim Rash     Procedures This Admission:  1.  Implantation of a Abbott dual chamber PPM on 05/26/20 by Dr Quentin Ore.  The patient received  A Assurity MRI PM 2272, serial [id number] PPM, and Tendril LPA 1200M 52cm, serial [id number]) RV lead and Tendril LPA 1200M 46cm, serial [id number] RA lead There were no immediate post procedure complications. 2.  CXR on 05/27/20 demonstrated no pneumothorax status post device implantation.   Brief HPI: Michelle Fuller is a 86 y.o. female was referred to electrophysiology in the outpatient setting for consideration of PPM implantation.  Past medical history includes above.  The patient has had symptomatic bradycardia without reversible causes identified.  Risks, benefits, and alternatives to PPM implantation were reviewed with the patient who wished to proceed. Plans to resume her Propanolol past pacing for her tremor.  Hospital Course:  The patient was admitted and underwent implantation of a PPM with details as outlined  above.  She was monitored on telemetry overnight which demonstrated SR/VP.  Left chest was without hematoma or ecchymosis.  The device was interrogated and found to be functioning normally.  CXR was obtained and demonstrated no pneumothorax status post device implantation.  Wound care, arm mobility, and restrictions were reviewed with the patient.  The patient feels well, denies any CP/SOB, with minimal site discomfort. She was examined by Dr. Quentin Ore and considered stable for discharge to home.    Physical Exam: Vitals:   05/27/20 0548 05/27/20 0724 05/27/20 0800 05/27/20 0916  BP: (!) 199/82 (!) 190/83 (!) 179/72 (!) 181/67  Pulse: 99 92 67 67  Resp: 19 18 18 19   Temp: 98.9 F (37.2 C) 98.5 F (36.9 C)    TempSrc: Oral Oral Oral   SpO2: 99% 95% 93% 95%  Weight:      Height:        GEN- The patient is well appearing, alert and oriented x 3 today.   HEENT: normocephalic, atraumatic; sclera clear, conjunctiva pink; hearing intact; oropharynx clear; neck supple, no JVP Lungs- CTA b/l, normal work of breathing.  No wheezes, rales, rhonchi Heart- RRR, no murmurs, rubs or gallops, PMI not laterally displaced GI- soft, non-tender, non-distended Extremities- no clubbing, cyanosis, or edema MS- no significant deformity or atrophy Skin- warm and dry, no rash or lesion,  left chest without hematoma/ecchymosis Psych- euthymic mood, full affect Neuro- no gross deficits   Labs:   Lab Results  Component Value Date   WBC 6.4 05/20/2020   HGB 16.2 (H) 05/20/2020   HCT 48.6 (H) 05/20/2020   MCV 90 05/20/2020   PLT 254 05/20/2020   No results for  input(s): NA, K, CL, CO2, BUN, CREATININE, CALCIUM, PROT, BILITOT, ALKPHOS, ALT, AST, GLUCOSE in the last 168 hours.  Invalid input(s): LABALBU  Discharge Medications:  Allergies as of 05/27/2020      Reactions   Clonidine Other (See Comments)   Fatigue drymouth   Hydrocodone-acetaminophen Nausea Only   Latex Itching, Rash   Flagyl  [metronidazole] Hives, Nausea And Vomiting, Nausea Only, Rash   Penicillins Hives, Rash   Reaction: 2 year ago   Shingrix [zoster Vac Recomb Adjuvanted] Other (See Comments)   Chills, nausea, diarrhea, and lightheadedness after second dose   Sulfamethoxazole-trimethoprim Rash      Medication List    TAKE these medications   amLODipine 5 MG tablet Commonly known as: NORVASC Take 1 tablet (5 mg total) by mouth daily.   aspirin 81 MG tablet Take 1 tablet (81 mg total) by mouth daily.   atorvastatin 10 MG tablet Commonly known as: LIPITOR Take 10 mg by mouth every morning.   carboxymethylcellulose 0.5 % Soln Commonly known as: REFRESH PLUS Place 1 drop into both eyes 2 (two) times daily.   ciprofloxacin 250 MG tablet Commonly known as: CIPRO Take 250 mg by mouth 2 (two) times daily between meals as needed (UTI).   CITRACAL PETITES/VITAMIN D PO Take 2 tablets by mouth 2 (two) times daily.   clonazePAM 0.5 MG tablet Commonly known as: KLONOPIN Take 0.25 mg by mouth at bedtime.   CULTURELLE PO Take 1 capsule by mouth every morning.   indapamide 1.25 MG tablet Commonly known as: LOZOL Take 1.25 mg by mouth every morning.   multivitamin with minerals Tabs tablet Take 1 tablet by mouth every morning.   omeprazole 20 MG capsule Commonly known as: PRILOSEC Take 20 mg by mouth See admin instructions. Take 20 mg every day for two every four months   polyethylene glycol powder 17 GM/SCOOP powder Commonly known as: GLYCOLAX/MIRALAX Take 17 g by mouth daily as needed for moderate constipation or severe constipation.   primidone 50 MG tablet Commonly known as: MYSOLINE Take 3 tablets (150 mg total) by mouth daily with breakfast.   propranolol 40 MG tablet Commonly known as: INDERAL Take 1 tablet (40 mg total) by mouth 2 (two) times daily.   SM Flax Seed Oil 1000 MG Caps Take 1,000 mg by mouth every morning.   valsartan 320 MG tablet Commonly known as: DIOVAN Take 320  mg by mouth every morning.            Discharge Care Instructions  (From admission, onward)         Start     Ordered   05/27/20 0000  Discharge wound care:       Comments: As noted in AVS   05/27/20 1023          Disposition:  Discharge Instructions    Diet - low sodium heart healthy   Complete by: As directed    Discharge wound care:   Complete by: As directed    As noted in AVS   Increase activity slowly   Complete by: As directed       Follow-up Information    [place] [facility] Follow up.   Specialty: Cardiology Why: 06/05/2020 @11 :20AM, wound check visit Contact information: [address], [address] [place]       Vickie Epley, MD Follow up.   Specialties: Cardiology, Radiology Why: 09/04/20 @ 1:45PM Contact information: [address] [place] [address] [phone]               Duration of Discharge Encounter: Greater than 30 minutes including physician time.  Norma Fredrickson, PA-C 05/27/2020 10:23 AM

## 2020-05-27 NOTE — Progress Notes (Signed)
Patient agreed to take prn medication. Hydralazine 25mg  administered. Will continue to monitor blood pressure.

## 2020-05-27 NOTE — Discharge Instructions (Signed)
    Supplemental Discharge Instructions for  Pacemaker/Defibrillator Patients    Activity No heavy lifting or vigorous activity with your left/right arm for 6 to 8 weeks.  Do not raise your left/right arm above your head for one week.  Gradually raise your affected arm as drawn below.              05/30/20                      05/31/20                      06/01/20                    06/02/20 __  NO DRIVING for  1 week  ; you may begin driving on  3/55/97 .  WOUND CARE - Keep the wound area clean and dry.  Do not get this area wet , no showers for one week; you may shower on  06/02/20    . - The tape/steri-strips on your wound will fall off; do not pull them off.  No bandage is needed on the site.  DO  NOT apply any creams, oils, or ointments to the wound area. - If you notice any drainage or discharge from the wound, any swelling or bruising at the site, or you develop a fever > 101? F after you are discharged home, call the office at once.  Special Instructions - You are still able to use cellular telephones; use the ear opposite the side where you have your pacemaker/defibrillator.  Avoid carrying your cellular phone near your device. - When traveling through airports, show security personnel your identification card to avoid being screened in the metal detectors.  Ask the security personnel to use the hand wand. - Avoid arc welding equipment, MRI testing (magnetic resonance imaging), TENS units (transcutaneous nerve stimulators).  Call the office for questions about other devices. - Avoid electrical appliances that are in poor condition or are not properly grounded. - Microwave ovens are safe to be near or to operate.

## 2020-05-28 ENCOUNTER — Telehealth: Payer: Self-pay

## 2020-05-28 NOTE — Telephone Encounter (Signed)
The pt asked if she should do the arm exercise daily or weekly? I told her to do the exercises daily.

## 2020-06-03 ENCOUNTER — Telehealth: Payer: Self-pay | Admitting: Cardiology

## 2020-06-03 NOTE — Telephone Encounter (Signed)
Patient informed she may sleep in her bed. Home monitor will be moved to where she is sleeping within 3-6 feet of the bed.

## 2020-06-03 NOTE — Telephone Encounter (Signed)
  1. Has your device fired? No   2. Is you device beeping? No   3. Are you experiencing draining or swelling at device site? No   4. Are you calling to see if we received your device transmission? No   5. Have you passed out? No  Michelle Fuller is calling stating she was advised not to sleep in her bed for what she thought was said a week since getting her implant. She is wanting to know if it is okay for her to sleep in her bed tonight, as well as if she still needs to be careful about rolling on that side. Please advise.      Please route to [place]

## 2020-06-05 ENCOUNTER — Ambulatory Visit (INDEPENDENT_AMBULATORY_CARE_PROVIDER_SITE_OTHER): Payer: Medicare PPO | Admitting: Emergency Medicine

## 2020-06-05 ENCOUNTER — Other Ambulatory Visit: Payer: Self-pay

## 2020-06-05 DIAGNOSIS — I495 Sick sinus syndrome: Secondary | ICD-10-CM

## 2020-06-05 LAB — CUP PACEART INCLINIC DEVICE CHECK
Battery Remaining Longevity: 73 mo
Battery Voltage: 3.04 V
Brady Statistic RA Percent Paced: 0.49 %
Brady Statistic RV Percent Paced: 99.46 %
Date Time Interrogation Session: 20220113150503
Implantable Lead Implant Date: 20220103
Implantable Lead Implant Date: 20220103
Implantable Lead Location: 753859
Implantable Lead Location: 753860
Implantable Pulse Generator Implant Date: 20220103
Lead Channel Impedance Value: 512.5 Ohm
Lead Channel Impedance Value: 562.5 Ohm
Lead Channel Pacing Threshold Amplitude: 0.5 V
Lead Channel Pacing Threshold Amplitude: 0.5 V
Lead Channel Pacing Threshold Amplitude: 0.75 V
Lead Channel Pacing Threshold Amplitude: 0.75 V
Lead Channel Pacing Threshold Pulse Width: 0.5 ms
Lead Channel Pacing Threshold Pulse Width: 0.5 ms
Lead Channel Pacing Threshold Pulse Width: 0.5 ms
Lead Channel Pacing Threshold Pulse Width: 0.5 ms
Lead Channel Sensing Intrinsic Amplitude: 2.6 mV
Lead Channel Sensing Intrinsic Amplitude: 7.2 mV
Lead Channel Setting Pacing Amplitude: 3.5 V
Lead Channel Setting Pacing Amplitude: 3.5 V
Lead Channel Setting Pacing Pulse Width: 0.5 ms
Lead Channel Setting Sensing Sensitivity: 2 mV
Pulse Gen Model: 2272
Pulse Gen Serial Number: 3885899

## 2020-06-05 NOTE — Progress Notes (Signed)
Wound check appointment. Steri-strips removed. Wound without redness or edema. Incision edges approximated, wound well healed. Normal device function. Thresholds, sensing, and impedances consistent with implant measurements. Device programmed at 3.5V for extra safety margin until 3 month visit. Histogram distribution appropriate for patient and level of activity. 1 AHR episode recorded, appears AT for 6 sec with peak a/v rate of 187/70.  No high ventricular rates noted. Patient educated about wound care, arm mobility, lifting restrictions. Patient enrolled in remote monitoring, next scheduled check 08/28/20.  ROV with Dr. Quentin Ore on 09/04/20.

## 2020-06-30 NOTE — Progress Notes (Signed)
Cardiology Office Note:    Date:  07/03/2020   ID:  Michelle Fuller, DOB 1935-02-20, MRN [id number]  PCP:  Lorne Skeens, MD  [facility] Cardiologist:  Freada Bergeron, MD  [facility] Electrophysiologist:  None   Referring MD: Lorne Skeens, MD    History of Present Illness:    Michelle Fuller is a 86 y.o. female with a hx of  HTN, mobitz type II AVB, CVA, breast cancer s/p XRT and HLD who returns to clinic for concern for symptomatic bradycardia.  During our last visit on 04/01/20, the patient was experiencing HR in the 30s with associated lightheadedness. Her propranolol was weaned with improvement in HR to 50s but tremors increased significantly. We referred her to EP and she continued to have 2:1 AVB on their exam. She therefore underwent dual chamber PPM placement on 05/26/20.  Patient states that she overall feels well. Heart rates on in the 80s. Her propranolol was increased back to $Remov'40mg'bOfdpb$  BID with significant improvement in tremors. Only mild tenderness at pacemaker site. No SOB, chest pain, lightheadedness, dizziness. She does tai chi without issues.   Past Medical History:  Diagnosis Date  . Abnormal Pap smear of cervix   . Breast cancer ([place])    Right breast, status post XRT  . Cataracts, both eyes   . Chest pain 2015   recent episode while in sun- recovered in shade with water  . Colon polyp   . Depression   . Diverticulitis   . Diverticulosis   . Elevated LDL cholesterol level   . Essential tremor   . Goiter    multinodular  . Hyperlipidemia   . Hypertension   . Osteopenia 8/97   Spine  . Ovarian cyst    found on CT scan  . Post-menopausal bleeding 01/2000   Huge polyps exc  . Syncope   . Vitamin D deficiency     Past Surgical History:  Procedure Laterality Date  . Bilateral cateract surgery  2014  . BREAST LUMPECTOMY Right 4/96  . HYSTEROSCOPY  0/01  . LAPAROSCOPIC BILATERAL SALPINGO OOPHERECTOMY Bilateral 01/07/2014    Procedure: LAPAROSCOPIC BILATERAL SALPINGO OOPHORECTOMY;  Surgeon: Lyman Speller, MD;  Location: [place] [place] [facility];  Service: Gynecology;  Laterality: Bilateral;  . PACEMAKER IMPLANT N/A 05/26/2020   Procedure: PACEMAKER IMPLANT;  Surgeon: Vickie Epley, MD;  Location: [place] [facility];  Service: Cardiovascular;  Laterality: N/A;    Current Medications: Current Meds  Medication Sig  . aspirin 81 MG tablet Take 1 tablet (81 mg total) by mouth daily.  Marland Kitchen atorvastatin (LIPITOR) 10 MG tablet Take 10 mg by mouth every morning.   . Calcium Citrate-Vitamin D (CITRACAL PETITES/VITAMIN D PO) Take 2 tablets by mouth 2 (two) times daily.  . carboxymethylcellulose (REFRESH PLUS) 0.5 % SOLN Place 1 drop into both eyes 2 (two) times daily.  . ciprofloxacin (CIPRO) 250 MG tablet Take 250 mg by mouth 2 (two) times daily between meals as needed (UTI).  . clonazePAM (KLONOPIN) 0.5 MG tablet Take 0.25 mg by mouth at bedtime.  . Flaxseed, Linseed, (SM FLAX SEED OIL) 1000 MG CAPS Take 1,000 mg by mouth every morning.   . indapamide (LOZOL) 1.25 MG tablet Take 1.25 mg by mouth every morning.   . Lactobacillus Rhamnosus, GG, (CULTURELLE PO) Take 1 capsule by mouth every morning.   . Multiple Vitamin (MULTIVITAMIN WITH MINERALS) TABS tablet Take 1 tablet by mouth every morning.  Marland Kitchen omeprazole (PRILOSEC) 20 MG capsule Take  20 mg by mouth See admin instructions. Take 20 mg every day for two every four months  . polyethylene glycol powder (GLYCOLAX/MIRALAX) powder Take 17 g by mouth daily as needed for moderate constipation or severe constipation.  . primidone (MYSOLINE) 50 MG tablet Take 3 tablets (150 mg total) by mouth daily with breakfast.  . propranolol (INDERAL) 40 MG tablet Take 1 tablet (40 mg total) by mouth 2 (two) times daily.  . valsartan (DIOVAN) 320 MG tablet Take 320 mg by mouth every morning.      Allergies:   Clonidine, Hydrocodone-acetaminophen, Latex, Flagyl [metronidazole], Penicillins, Shingrix  [zoster vac recomb adjuvanted], and Sulfamethoxazole-trimethoprim   Social History   Socioeconomic History  . Marital status: Widowed    Spouse name: Not on file  . Number of children: 3  . Years of education: BA  . Highest education level: Not on file  Occupational History  . Occupation: Retired Product manager: RETIRED  Tobacco Use  . Smoking status: Never Smoker  . Smokeless tobacco: Never Used  Substance and Sexual Activity  . Alcohol use: No    Alcohol/week: 0.0 standard drinks  . Drug use: No  . Sexual activity: Not Currently    Partners: Male  Other Topics Concern  . Not on file  Social History Narrative   Widowed.  Lives alone.  Ambulates without assistance.   Drinks about 1 cup of green tea a day    Social Determinants of Radio broadcast assistant Strain: Not on file  Food Insecurity: Not on file  Transportation Needs: Not on file  Physical Activity: Not on file  Stress: Not on file  Social Connections: Not on file     Family History: The patient's family history includes Alzheimer's disease in her father; Heart attack in her mother; Heart disease in her mother; Stroke in her maternal grandmother; Thyroid disease in her maternal grandmother and mother.  ROS:   Please see the history of present illness.    Review of Systems  Constitutional: Negative for chills and fever.  HENT: Negative for nosebleeds.   Eyes: Negative for blurred vision.  Respiratory: Negative for shortness of breath.   Cardiovascular: Negative for chest pain, palpitations, orthopnea, claudication, leg swelling and PND.  Gastrointestinal: Negative for melena, nausea and vomiting.  Genitourinary: Negative for flank pain.  Musculoskeletal: Positive for joint pain. Negative for falls.  Neurological: Positive for tremors. Negative for dizziness and loss of consciousness.  Endo/Heme/Allergies: Negative for polydipsia.  Psychiatric/Behavioral: Negative for substance abuse.     EKGs/Labs/Other Studies Reviewed:    The following studies were reviewed today: TTE 05-17-20: IMPRESSIONS  1. Left ventricular ejection fraction, by estimation, is 60 to 65%. The  left ventricle has normal function. The left ventricle has no regional  wall motion abnormalities. Left ventricular diastolic parameters are  indeterminate.  2. Right ventricular systolic function is normal. The right ventricular  size is normal. There is normal pulmonary artery systolic pressure.  3. Left atrial size was mildly dilated.  4. The mitral valve is normal in structure. Mild mitral valve  regurgitation. No evidence of mitral stenosis.  5. The aortic valve was not well visualized. Aortic valve regurgitation  is mild. Mild aortic valve sclerosis is present, with no evidence of  aortic valve stenosis.  6. The inferior vena cava is normal in size with greater than 50%  respiratory variability, suggesting right atrial pressure of 3 mmHg.  Myoview 2016:  Nuclear stress EF: 66%.  There  was no ST segment deviation noted during stress.  This is a low risk study.  The left ventricular ejection fraction is hyperdynamic (>65%).   1. Fixed small, mild basal anterolateral perfusion defect.  Given normal wall motion, this likely represents attenuation.  No ischemia.  2. Normal wall motion and normal EF. 3. Low risk study.   Recent Labs: 05/20/2020: BUN 18; Creatinine, Ser 0.79; Hemoglobin 16.2; Platelets 254; Potassium 4.1; Sodium 137  Recent Lipid Panel    Component Value Date/Time   CHOL 154 01/16/2013 0356   TRIG 197 (H) 01/16/2013 0356   HDL 36 (L) 01/16/2013 0356   CHOLHDL 4.3 01/16/2013 0356   VLDL 39 01/16/2013 0356   LDLCALC 79 01/16/2013 0356     Physical Exam:    VS:  BP 124/86   Pulse 80   Ht 5\' 4"  (1.626 m)   Wt 139 lb 9.6 oz (63.3 kg)   LMP 05/25/1999   SpO2 97%   BMI 23.96 kg/m     Wt Readings from Last 3 Encounters:  07/03/20 139 lb 9.6 oz (63.3 kg)   05/26/20 150 lb (68 kg)  04/04/20 142 lb 12.8 oz (64.8 kg)     GEN:  Well nourished, well developed in no acute distress HEENT: Normal NECK: No JVD; No carotid bruits CARDIAC: RRR, no murmurs, rubs, gallops. Pacemaker insertion site c/d/i. No erythema or hematoma RESPIRATORY:  Clear to auscultation without rales, wheezing or rhonchi  ABDOMEN: Soft, non-tender, non-distended MUSCULOSKELETAL:  No edema; No deformity  SKIN: Warm and dry NEUROLOGIC:  Alert and oriented x 3 PSYCHIATRIC:  Normal affect   ASSESSMENT:    1. AV block, 2nd degree   2. Essential tremor   3. Pure hypercholesterolemia   4. Primary hypertension    PLAN:    In order of problems listed above:  #Symptomatic bradycardia #High grade AVB Patient with continued high degree AVB despite stopping propranolol. Also with significant worsening of essential tremors while off the BB. After evaluation by EP, patient underwent successful dual chamber PPM placement. Doing well today with HR 80s. -S/p dual chamber PPM placement--doing well -Follow-up with EP as scheduled  #HTN: Well controlled today -Continue amlodipine 5mg  daily  -Continue home valsartan and lozol  -Continue propranolol 40mg  BID  #HLD Managed by PCP. -Continue home atorva 10mg  daily -Repeat labs with PCP  #Essential Tremor: -Resumed propranolol with significant improvement    Medication Adjustments/Labs and Tests Ordered: Current medicines are reviewed at length with the patient today.  Concerns regarding medicines are outlined above.  No orders of the defined types were placed in this encounter.  No orders of the defined types were placed in this encounter.   Patient Instructions  Medication Instructions:  Your physician recommends that you continue on your current medications as directed. Please refer to the Current Medication list given to you today.  *If you need a refill on your cardiac medications before your next appointment,  please call your pharmacy*   Lab Work: none If you have labs (blood work) drawn today and your tests are completely normal, you will receive your results only by: Marland Kitchen MyChart Message (if you have MyChart) OR . A paper copy in the mail If you have any lab test that is abnormal or we need to change your treatment, we will call you to review the results.   Testing/Procedures: none   Follow-Up: At [facility], you and your health needs are our priority.  As part of our continuing mission  to provide you with exceptional heart care, we have created designated Provider Care Teams.  These Care Teams include your primary Cardiologist (physician) and Advanced Practice Providers (APPs -  Physician Assistants and Nurse Practitioners) who all work together to provide you with the care you need, when you need it.  We recommend signing up for the patient portal called "MyChart".  Sign up information is provided on this After Visit Summary.  MyChart is used to connect with patients for Virtual Visits (Telemedicine).  Patients are able to view lab/test results, encounter notes, upcoming appointments, etc.  Non-urgent messages can be sent to your provider as well.   To learn more about what you can do with MyChart, go to [email or web address].    Your next appointment:   6 month(s)  The format for your next appointment:   In Person  Provider:   You may see Freada Bergeron, MD or one of the following Advanced Practice Providers on your designated Care Team:    Richardson Dopp, PA-C  Robbie Lis, [place]    Other Instructions      Signed, Freada Bergeron, MD  07/03/2020 1:41 PM    [place]

## 2020-07-03 ENCOUNTER — Encounter: Payer: Self-pay | Admitting: Cardiology

## 2020-07-03 ENCOUNTER — Ambulatory Visit: Payer: Medicare PPO | Admitting: Cardiology

## 2020-07-03 ENCOUNTER — Other Ambulatory Visit: Payer: Self-pay

## 2020-07-03 VITALS — BP 124/86 | HR 80 | Ht 64.0 in | Wt 139.6 lb

## 2020-07-03 DIAGNOSIS — I10 Essential (primary) hypertension: Secondary | ICD-10-CM | POA: Diagnosis not present

## 2020-07-03 DIAGNOSIS — I441 Atrioventricular block, second degree: Secondary | ICD-10-CM | POA: Diagnosis not present

## 2020-07-03 DIAGNOSIS — E7800 Pure hypercholesterolemia, unspecified: Secondary | ICD-10-CM

## 2020-07-03 DIAGNOSIS — G250 Essential tremor: Secondary | ICD-10-CM

## 2020-07-03 NOTE — Patient Instructions (Signed)
Medication Instructions:  Your physician recommends that you continue on your current medications as directed. Please refer to the Current Medication list given to you today.  *If you need a refill on your cardiac medications before your next appointment, please call your pharmacy*   Lab Work: none If you have labs (blood work) drawn today and your tests are completely normal, you will receive your results only by: Marland Kitchen MyChart Message (if you have MyChart) OR . A paper copy in the mail If you have any lab test that is abnormal or we need to change your treatment, we will call you to review the results.   Testing/Procedures: none   Follow-Up: At [facility], you and your health needs are our priority.  As part of our continuing mission to provide you with exceptional heart care, we have created designated Provider Care Teams.  These Care Teams include your primary Cardiologist (physician) and Advanced Practice Providers (APPs -  Physician Assistants and Nurse Practitioners) who all work together to provide you with the care you need, when you need it.  We recommend signing up for the patient portal called "MyChart".  Sign up information is provided on this After Visit Summary.  MyChart is used to connect with patients for Virtual Visits (Telemedicine).  Patients are able to view lab/test results, encounter notes, upcoming appointments, etc.  Non-urgent messages can be sent to your provider as well.   To learn more about what you can do with MyChart, go to [email or web address].    Your next appointment:   6 month(s)  The format for your next appointment:   In Person  Provider:   You may see Freada Bergeron, MD or one of the following Advanced Practice Providers on your designated Care Team:    Richardson Dopp, PA-C  Robbie Lis, [place]    Other Instructions

## 2020-08-28 ENCOUNTER — Ambulatory Visit (INDEPENDENT_AMBULATORY_CARE_PROVIDER_SITE_OTHER): Payer: Medicare PPO

## 2020-08-28 DIAGNOSIS — I441 Atrioventricular block, second degree: Secondary | ICD-10-CM | POA: Diagnosis not present

## 2020-08-28 LAB — CUP PACEART REMOTE DEVICE CHECK
Battery Remaining Longevity: 70 mo
Battery Remaining Percentage: 95.5 %
Battery Voltage: 3.01 V
Brady Statistic AP VP Percent: 2.4 %
Brady Statistic AP VS Percent: 1 %
Brady Statistic AS VP Percent: 97 %
Brady Statistic AS VS Percent: 1 %
Brady Statistic RA Percent Paced: 2.2 %
Brady Statistic RV Percent Paced: 99 %
Date Time Interrogation Session: 20220407031526
Implantable Lead Implant Date: 20220103
Implantable Lead Implant Date: 20220103
Implantable Lead Location: 753859
Implantable Lead Location: 753860
Implantable Pulse Generator Implant Date: 20220103
Lead Channel Impedance Value: 550 Ohm
Lead Channel Impedance Value: 590 Ohm
Lead Channel Pacing Threshold Amplitude: 0.5 V
Lead Channel Pacing Threshold Amplitude: 0.75 V
Lead Channel Pacing Threshold Pulse Width: 0.5 ms
Lead Channel Pacing Threshold Pulse Width: 0.5 ms
Lead Channel Sensing Intrinsic Amplitude: 3.3 mV
Lead Channel Sensing Intrinsic Amplitude: 7.2 mV
Lead Channel Setting Pacing Amplitude: 3.5 V
Lead Channel Setting Pacing Amplitude: 3.5 V
Lead Channel Setting Pacing Pulse Width: 0.5 ms
Lead Channel Setting Sensing Sensitivity: 2 mV
Pulse Gen Model: 2272
Pulse Gen Serial Number: 3885899

## 2020-09-04 ENCOUNTER — Encounter: Payer: Medicare PPO | Admitting: Cardiology

## 2020-09-09 NOTE — Progress Notes (Signed)
Remote pacemaker transmission.   

## 2020-09-16 ENCOUNTER — Other Ambulatory Visit: Payer: Self-pay

## 2020-09-16 ENCOUNTER — Ambulatory Visit (INDEPENDENT_AMBULATORY_CARE_PROVIDER_SITE_OTHER): Payer: Medicare PPO | Admitting: Cardiology

## 2020-09-16 ENCOUNTER — Encounter: Payer: Self-pay | Admitting: Cardiology

## 2020-09-16 VITALS — BP 132/82 | HR 86 | Ht 64.0 in | Wt 140.2 lb

## 2020-09-16 DIAGNOSIS — I441 Atrioventricular block, second degree: Secondary | ICD-10-CM | POA: Diagnosis not present

## 2020-09-16 DIAGNOSIS — Z950 Presence of cardiac pacemaker: Secondary | ICD-10-CM | POA: Insufficient documentation

## 2020-09-16 DIAGNOSIS — G250 Essential tremor: Secondary | ICD-10-CM | POA: Diagnosis not present

## 2020-09-16 DIAGNOSIS — I495 Sick sinus syndrome: Secondary | ICD-10-CM

## 2020-09-16 NOTE — Progress Notes (Signed)
Electrophysiology Office Follow up Visit Note:    Date:  09/16/2020   ID:  Michelle Fuller, DOB 1935/02/25, MRN [id number]  PCP:  Lorne Skeens, MD  [facility] Cardiologist:  Freada Bergeron, MD  [facility] Electrophysiologist:  Vickie Epley, MD    Interval History:    Michelle Fuller is a 86 y.o. female who presents for a follow up visit.  I last saw the patient July 03, 2020 for her second-degree AV block.  She underwent a permanent pacemaker implant on May 26, 2020.  Since her implant she has done well.  She is back on primidone and propranolol to help control her tremors.  On this regimen they have significantly improved.  She recently learned that her primary care physician is retiring.    Past Medical History:  Diagnosis Date  . Abnormal Pap smear of cervix   . Breast cancer ([place])    Right breast, status post XRT  . Cataracts, both eyes   . Chest pain 2015   recent episode while in sun- recovered in shade with water  . Colon polyp   . Depression   . Diverticulitis   . Diverticulosis   . Elevated LDL cholesterol level   . Essential tremor   . Goiter    multinodular  . Hyperlipidemia   . Hypertension   . Osteopenia 8/97   Spine  . Ovarian cyst    found on CT scan  . Post-menopausal bleeding 01/2000   Huge polyps exc  . Syncope   . Vitamin D deficiency     Past Surgical History:  Procedure Laterality Date  . Bilateral cateract surgery  2014  . BREAST LUMPECTOMY Right 4/96  . HYSTEROSCOPY  0/01  . LAPAROSCOPIC BILATERAL SALPINGO OOPHERECTOMY Bilateral 01/07/2014   Procedure: LAPAROSCOPIC BILATERAL SALPINGO OOPHORECTOMY;  Surgeon: Lyman Speller, MD;  Location: [facility];  Service: Gynecology;  Laterality: Bilateral;  . PACEMAKER IMPLANT N/A 05/26/2020   Procedure: PACEMAKER IMPLANT;  Surgeon: Vickie Epley, MD;  Location: [place] [facility];  Service: Cardiovascular;  Laterality: N/A;    Current Medications: Current Meds   Medication Sig  . aspirin 81 MG tablet Take 1 tablet (81 mg total) by mouth daily.  Marland Kitchen atorvastatin (LIPITOR) 10 MG tablet Take 10 mg by mouth every morning.   . Calcium Citrate-Vitamin D (CITRACAL PETITES/VITAMIN D PO) Take 2 tablets by mouth 2 (two) times daily.  . carboxymethylcellulose (REFRESH PLUS) 0.5 % SOLN Place 1 drop into both eyes 2 (two) times daily.  . ciprofloxacin (CIPRO) 250 MG tablet Take 250 mg by mouth 2 (two) times daily between meals as needed (UTI).  . clonazePAM (KLONOPIN) 0.5 MG tablet Take 0.25 mg by mouth at bedtime.  . Flaxseed, Linseed, (SM FLAX SEED OIL) 1000 MG CAPS Take 1,000 mg by mouth every morning.   . indapamide (LOZOL) 1.25 MG tablet Take 1.25 mg by mouth every morning.   . Lactobacillus Rhamnosus, GG, (CULTURELLE PO) Take 1 capsule by mouth every morning.   . Multiple Vitamin (MULTIVITAMIN WITH MINERALS) TABS tablet Take 1 tablet by mouth every morning.  Marland Kitchen omeprazole (PRILOSEC) 20 MG capsule Take 20 mg by mouth See admin instructions. Take 20 mg every day for two every four months  . polyethylene glycol powder (GLYCOLAX/MIRALAX) powder Take 17 g by mouth daily as needed for moderate constipation or severe constipation.  . primidone (MYSOLINE) 50 MG tablet Take 3 tablets (150 mg total) by mouth daily with breakfast.  .  propranolol (INDERAL) 40 MG tablet Take 1 tablet (40 mg total) by mouth 2 (two) times daily.  . valsartan (DIOVAN) 320 MG tablet Take 320 mg by mouth every morning.      Allergies:   Clonidine, Hydrocodone-acetaminophen, Latex, Flagyl [metronidazole], Penicillins, Shingrix [zoster vac recomb adjuvanted], and Sulfamethoxazole-trimethoprim   Social History   Socioeconomic History  . Marital status: Widowed    Spouse name: Not on file  . Number of children: 3  . Years of education: BA  . Highest education level: Not on file  Occupational History  . Occupation: Retired Product manager: RETIRED  Tobacco Use  . Smoking status: Never  Smoker  . Smokeless tobacco: Never Used  Substance and Sexual Activity  . Alcohol use: No    Alcohol/week: 0.0 standard drinks  . Drug use: No  . Sexual activity: Not Currently    Partners: Male  Other Topics Concern  . Not on file  Social History Narrative   Widowed.  Lives alone.  Ambulates without assistance.   Drinks about 1 cup of green tea a day    Social Determinants of Radio broadcast assistant Strain: Not on file  Food Insecurity: Not on file  Transportation Needs: Not on file  Physical Activity: Not on file  Stress: Not on file  Social Connections: Not on file     Family History: The patient's family history includes Alzheimer's disease in her father; Heart attack in her mother; Heart disease in her mother; Stroke in her maternal grandmother; Thyroid disease in her maternal grandmother and mother.  ROS:   Please see the history of present illness.    All other systems reviewed and are negative.  EKGs/Labs/Other Studies Reviewed:    The following studies were reviewed today:  September 16, 2020 device interrogation in clinic personally reviewed Battery longevity 10.4 years. Presenting rhythm a sensed/V pace Underlying rhythm is 2-1 AV block with a ventricular rate of 44 bpm. Ventricularly pacing greater than 99% 1 mode switch episode for an atrial tachycardia lasting 4 seconds Lead parameters are stable Lead outputs adjusted to optimize battery longevity during today's visit  EKG:  The ekg ordered today demonstrates atrial sensed, ventricular paced rhythm  Recent Labs: 05/20/2020: BUN 18; Creatinine, Ser 0.79; Hemoglobin 16.2; Platelets 254; Potassium 4.1; Sodium 137  Recent Lipid Panel    Component Value Date/Time   CHOL 154 01/16/2013 0356   TRIG 197 (H) 01/16/2013 0356   HDL 36 (L) 01/16/2013 0356   CHOLHDL 4.3 01/16/2013 0356   VLDL 39 01/16/2013 0356   LDLCALC 79 01/16/2013 0356    Physical Exam:    VS:  BP 132/82   Pulse 86   Ht 5\' 4"  (1.626  m)   Wt 140 lb 3.2 oz (63.6 kg)   LMP 05/25/1999   SpO2 97%   BMI 24.07 kg/m     Wt Readings from Last 3 Encounters:  09/16/20 140 lb 3.2 oz (63.6 kg)  07/03/20 139 lb 9.6 oz (63.3 kg)  05/26/20 150 lb (68 kg)     GEN:  Well nourished, well developed in no acute distress HEENT: Normal NECK: No JVD; No carotid bruits LYMPHATICS: No lymphadenopathy CARDIAC: RRR, no murmurs, rubs, gallops.  Pacemaker pocket well-healed RESPIRATORY:  Clear to auscultation without rales, wheezing or rhonchi  ABDOMEN: Soft, non-tender, non-distended MUSCULOSKELETAL:  No edema; No deformity  SKIN: Warm and dry NEUROLOGIC:  Alert and oriented x 3.  Tremor improved since last visit  PSYCHIATRIC:  Normal affect   ASSESSMENT:    1. AV block, 2nd degree   2. Sick sinus syndrome ([place])   3. Pacemaker   4. Essential tremor    PLAN:    In order of problems listed above:  1. Second-degree AV block post permanent pacemaker implant Doing well since pacemaker implant.  Has not been restarted on her propranolol for her essential tremor.  This is improved her symptoms.  Device reprogrammed today to optimize battery longevity.  From a device perspective, can see the patient annually with continued remote monitoring.  2.  Essential tremor Continue primidone and propranolol.  I have encouraged her to find a primary care physician.  Follow-up 1 year or sooner as needed.      Medication Adjustments/Labs and Tests Ordered: Current medicines are reviewed at length with the patient today.  Concerns regarding medicines are outlined above.  Orders Placed This Encounter  Procedures  . EKG 12-Lead   No orders of the defined types were placed in this encounter.    Signed, Lars Mage, MD, [facility], [facility] 09/16/2020 6:35 PM    Electrophysiology [facility]

## 2020-09-16 NOTE — Patient Instructions (Signed)
Medication Instructions:  Your physician recommends that you continue on your current medications as directed. Please refer to the Current Medication list given to you today. *If you need a refill on your cardiac medications before your next appointment, please call your pharmacy*  Lab Work: None ordered. If you have labs (blood work) drawn today and your tests are completely normal, you will receive your results only by: Marland Kitchen MyChart Message (if you have MyChart) OR . A paper copy in the mail If you have any lab test that is abnormal or we need to change your treatment, we will call you to review the results.  Testing/Procedures: None ordered.  Follow-Up: At [facility], you and your health needs are our priority.  As part of our continuing mission to provide you with exceptional heart care, we have created designated Provider Care Teams.  These Care Teams include your primary Cardiologist (physician) and Advanced Practice Providers (APPs -  Physician Assistants and Nurse Practitioners) who all work together to provide you with the care you need, when you need it.  Your next appointment:   Your physician wants you to follow-up in: one year with Dr. Quentin Ore.   You will receive a reminder letter in the mail two months in advance. If you don't receive a letter, please call our office to schedule the follow-up appointment.  Remote monitoring is used to monitor your Pacemaker from home. This monitoring reduces the number of office visits required to check your device to one time per year. It allows [place] to keep an eye on the functioning of your device to ensure it is working properly. You are scheduled for a device check from home on 11/27/2020. You may send your transmission at any time that day. If you have a wireless device, the transmission will be sent automatically. After your physician reviews your transmission, you will receive a postcard with your next transmission date.

## 2020-11-08 ENCOUNTER — Other Ambulatory Visit: Payer: Self-pay | Admitting: Physician Assistant

## 2020-11-27 ENCOUNTER — Ambulatory Visit (INDEPENDENT_AMBULATORY_CARE_PROVIDER_SITE_OTHER): Payer: Medicare PPO

## 2020-11-27 DIAGNOSIS — I495 Sick sinus syndrome: Secondary | ICD-10-CM

## 2020-11-27 LAB — CUP PACEART REMOTE DEVICE CHECK
Battery Remaining Longevity: 115 mo
Battery Remaining Percentage: 95.5 %
Battery Voltage: 3.01 V
Brady Statistic AP VP Percent: 3 %
Brady Statistic AP VS Percent: 1 %
Brady Statistic AS VP Percent: 97 %
Brady Statistic AS VS Percent: 1 %
Brady Statistic RA Percent Paced: 2.7 %
Brady Statistic RV Percent Paced: 99 %
Date Time Interrogation Session: 20220707020014
Implantable Lead Implant Date: 20220103
Implantable Lead Implant Date: 20220103
Implantable Lead Location: 753859
Implantable Lead Location: 753860
Implantable Pulse Generator Implant Date: 20220103
Lead Channel Impedance Value: 410 Ohm
Lead Channel Impedance Value: 550 Ohm
Lead Channel Pacing Threshold Amplitude: 0.5 V
Lead Channel Pacing Threshold Amplitude: 0.875 V
Lead Channel Pacing Threshold Pulse Width: 0.5 ms
Lead Channel Pacing Threshold Pulse Width: 0.5 ms
Lead Channel Sensing Intrinsic Amplitude: 3.4 mV
Lead Channel Sensing Intrinsic Amplitude: 9.3 mV
Lead Channel Setting Pacing Amplitude: 1.125
Lead Channel Setting Pacing Amplitude: 2 V
Lead Channel Setting Pacing Pulse Width: 0.5 ms
Lead Channel Setting Sensing Sensitivity: 2 mV
Pulse Gen Model: 2272
Pulse Gen Serial Number: 3885899

## 2020-12-18 NOTE — Progress Notes (Signed)
Remote pacemaker transmission.   

## 2021-02-26 ENCOUNTER — Ambulatory Visit (INDEPENDENT_AMBULATORY_CARE_PROVIDER_SITE_OTHER): Payer: Medicare PPO

## 2021-02-26 DIAGNOSIS — I495 Sick sinus syndrome: Secondary | ICD-10-CM | POA: Diagnosis not present

## 2021-02-26 LAB — CUP PACEART REMOTE DEVICE CHECK
Battery Remaining Longevity: 113 mo
Battery Remaining Percentage: 94 %
Battery Voltage: 3.01 V
Brady Statistic AP VP Percent: 2.4 %
Brady Statistic AP VS Percent: 1 %
Brady Statistic AS VP Percent: 97 %
Brady Statistic AS VS Percent: 1 %
Brady Statistic RA Percent Paced: 2.2 %
Brady Statistic RV Percent Paced: 99 %
Date Time Interrogation Session: 20221006020012
Implantable Lead Implant Date: 20220103
Implantable Lead Implant Date: 20220103
Implantable Lead Location: 753859
Implantable Lead Location: 753860
Implantable Pulse Generator Implant Date: 20220103
Lead Channel Impedance Value: 440 Ohm
Lead Channel Impedance Value: 580 Ohm
Lead Channel Pacing Threshold Amplitude: 0.5 V
Lead Channel Pacing Threshold Amplitude: 0.875 V
Lead Channel Pacing Threshold Pulse Width: 0.5 ms
Lead Channel Pacing Threshold Pulse Width: 0.5 ms
Lead Channel Sensing Intrinsic Amplitude: 3.4 mV
Lead Channel Sensing Intrinsic Amplitude: 9.3 mV
Lead Channel Setting Pacing Amplitude: 1.125
Lead Channel Setting Pacing Amplitude: 2 V
Lead Channel Setting Pacing Pulse Width: 0.5 ms
Lead Channel Setting Sensing Sensitivity: 2 mV
Pulse Gen Model: 2272
Pulse Gen Serial Number: 3885899

## 2021-03-05 NOTE — Progress Notes (Signed)
Remote pacemaker transmission.   

## 2021-03-30 ENCOUNTER — Telehealth: Payer: Self-pay | Admitting: Cardiology

## 2021-03-30 MED ORDER — AMLODIPINE BESYLATE 5 MG PO TABS: 5.0000 mg | ORAL_TABLET | Freq: Every day | ORAL | 0 refills | Status: DC

## 2021-03-30 NOTE — Telephone Encounter (Signed)
Pt's medication was sent to pt's pharmacy as requested. Confirmation received.  °

## 2021-03-30 NOTE — Telephone Encounter (Signed)
*  STAT* If patient is at the pharmacy, call can be transferred to refill team.   1. Which medications need to be refilled? (please list name of each medication and dose if known)  amLODipine (NORVASC) 5 MG tablet (Expired)  2. Which pharmacy/location (including street and city if local pharmacy) is medication to be sent to? [id number] - [place], [place] - [address]. AT [address]  3. Do they need a 30 day or 90 day supply? [place] IS COMPLETELY OUT OF THIS MEDICINE

## 2021-03-31 NOTE — Progress Notes (Deleted)
Cardiology Office Note:    Date:  03/31/2021   ID:  Michelle Fuller, DOB 17-Sep-1934, MRN [id number]  PCP:  Sueanne Margarita, DO  [facility] Cardiologist:  Freada Bergeron, MD  [facility] Electrophysiologist:  Vickie Epley, MD   Referring MD: Altheimer, Legrand Como, MD    History of Present Illness:    Michelle Fuller is a 86 y.o. female with a hx of  HTN, mobitz type II AVB, CVA, breast cancer s/p XRT, HLD and bradycardia who presents to clinic for follow-up  During our initial visit on 04/01/20, the patient was experiencing HR in the 30s with associated lightheadedness. Her propranolol was weaned with improvement in HR to 50s but tremors increased significantly. We referred her to EP and she continued to have 2:1 AVB on their exam. She therefore underwent dual chamber PPM placement on 05/26/20.  During our last visit on 07/03/20, she was doing well. Her propranolol had been increased with significant improvement in her tremors. Saw Dr. Quentin Ore on 09/16/20 where she was doing very well.   Today, ***  Past Medical History:  Diagnosis Date   Abnormal Pap smear of cervix    Breast cancer ([place])    Right breast, status post XRT   Cataracts, both eyes    Chest pain 2015   recent episode while in sun- recovered in shade with water   Colon polyp    Depression    Diverticulitis    Diverticulosis    Elevated LDL cholesterol level    Essential tremor    Goiter    multinodular   Hyperlipidemia    Hypertension    Osteopenia 8/97   Spine   Ovarian cyst    found on CT scan   Post-menopausal bleeding 01/2000   Huge polyps exc   Syncope    Vitamin D deficiency     Past Surgical History:  Procedure Laterality Date   Bilateral cateract surgery  2014   BREAST LUMPECTOMY Right 4/96   HYSTEROSCOPY  0/01   LAPAROSCOPIC BILATERAL SALPINGO OOPHERECTOMY Bilateral 01/07/2014   Procedure: LAPAROSCOPIC BILATERAL SALPINGO OOPHORECTOMY;  Surgeon: Lyman Speller, MD;   Location: [facility];  Service: Gynecology;  Laterality: Bilateral;   PACEMAKER IMPLANT N/A 05/26/2020   Procedure: PACEMAKER IMPLANT;  Surgeon: Vickie Epley, MD;  Location: [facility];  Service: Cardiovascular;  Laterality: N/A;    Current Medications: No outpatient medications have been marked as taking for the 04/14/21 encounter (Appointment) with Freada Bergeron, MD.     Allergies:   Clonidine, Hydrocodone-acetaminophen, Latex, Flagyl [metronidazole], Penicillins, Shingrix [zoster vac recomb adjuvanted], and Sulfamethoxazole-trimethoprim   Social History   Socioeconomic History   Marital status: Widowed    Spouse name: Not on file   Number of children: 3   Years of education: BA   Highest education level: Not on file  Occupational History   Occupation: Retired Product manager: RETIRED  Tobacco Use   Smoking status: Never   Smokeless tobacco: Never  Substance and Sexual Activity   Alcohol use: No    Alcohol/week: 0.0 standard drinks   Drug use: No   Sexual activity: Not Currently    Partners: Male  Other Topics Concern   Not on file  Social History Narrative   Widowed.  Lives alone.  Ambulates without assistance.   Drinks about 1 cup of green tea a day    Social Determinants of Health   Financial Resource Strain: Not on file  Food Insecurity: Not on file  Transportation Needs: Not on file  Physical Activity: Not on file  Stress: Not on file  Social Connections: Not on file     Family History: The patient's family history includes Alzheimer's disease in her father; Heart attack in her mother; Heart disease in her mother; Stroke in her maternal grandmother; Thyroid disease in her maternal grandmother and mother.  ROS:   Please see the history of present illness.    Review of Systems  Constitutional:  Negative for chills and fever.  HENT:  Negative for nosebleeds.   Eyes:  Negative for blurred vision.  Respiratory:  Negative for shortness of  breath.   Cardiovascular:  Negative for chest pain, palpitations, orthopnea, claudication, leg swelling and PND.  Gastrointestinal:  Negative for melena, nausea and vomiting.  Genitourinary:  Negative for flank pain.  Musculoskeletal:  Positive for joint pain. Negative for falls.  Neurological:  Positive for tremors. Negative for dizziness and loss of consciousness.  Endo/Heme/Allergies:  Negative for polydipsia.  Psychiatric/Behavioral:  Negative for substance abuse.    EKGs/Labs/Other Studies Reviewed:    The following studies were reviewed today: TTE 05/19/2020: IMPRESSIONS   1. Left ventricular ejection fraction, by estimation, is 60 to 65%. The  left ventricle has normal function. The left ventricle has no regional  wall motion abnormalities. Left ventricular diastolic parameters are  indeterminate.   2. Right ventricular systolic function is normal. The right ventricular  size is normal. There is normal pulmonary artery systolic pressure.   3. Left atrial size was mildly dilated.   4. The mitral valve is normal in structure. Mild mitral valve  regurgitation. No evidence of mitral stenosis.   5. The aortic valve was not well visualized. Aortic valve regurgitation  is mild. Mild aortic valve sclerosis is present, with no evidence of  aortic valve stenosis.   6. The inferior vena cava is normal in size with greater than 50%  respiratory variability, suggesting right atrial pressure of 3 mmHg.  Myoview 2016: Nuclear stress EF: 66%. There was no ST segment deviation noted during stress. This is a low risk study. The left ventricular ejection fraction is hyperdynamic (>65%).   1. Fixed small, mild basal anterolateral perfusion defect.  Given normal wall motion, this likely represents attenuation.  No ischemia.  2. Normal wall motion and normal EF. 3. Low risk study.   Recent Labs: 05/20/2020: BUN 18; Creatinine, Ser 0.79; Hemoglobin 16.2; Platelets 254; Potassium 4.1; Sodium 137   Recent Lipid Panel    Component Value Date/Time   CHOL 154 01/16/2013 0356   TRIG 197 (H) 01/16/2013 0356   HDL 36 (L) 01/16/2013 0356   CHOLHDL 4.3 01/16/2013 0356   VLDL 39 01/16/2013 0356   LDLCALC 79 01/16/2013 0356     Physical Exam:    VS:  LMP 05/25/1999     Wt Readings from Last 3 Encounters:  09/16/20 140 lb 3.2 oz (63.6 kg)  07/03/20 139 lb 9.6 oz (63.3 kg)  05/26/20 150 lb (68 kg)     GEN:  Well nourished, well developed in no acute distress HEENT: Normal NECK: No JVD; No carotid bruits CARDIAC: RRR, no murmurs, rubs, gallops. Pacemaker insertion site c/d/i. No erythema or hematoma RESPIRATORY:  Clear to auscultation without rales, wheezing or rhonchi  ABDOMEN: Soft, non-tender, non-distended MUSCULOSKELETAL:  No edema; No deformity  SKIN: Warm and dry NEUROLOGIC:  Alert and oriented x 3 PSYCHIATRIC:  Normal affect   ASSESSMENT:    No  diagnosis found.  PLAN:    In order of problems listed above:  #Symptomatic bradycardia #High grade AVB Patient with continued high degree AVB despite stopping propranolol. Also with significant worsening of essential tremors while off the BB. After evaluation by EP, patient underwent successful dual chamber PPM placement.  -S/p dual chamber PPM placement -Follow-up with EP as scheduled   #HTN: Well controlled today -Continue amlodipine 5mg  daily  -Continue home valsartan and lozol  -Continue propranolol 40mg  BID   #HLD Managed by PCP. -Continue home atorva 10mg  daily -Repeat labs with PCP  #Essential Tremor: -Resumed propranolol with significant improvement    Medication Adjustments/Labs and Tests Ordered: Current medicines are reviewed at length with the patient today.  Concerns regarding medicines are outlined above.  No orders of the defined types were placed in this encounter.  No orders of the defined types were placed in this encounter.   There are no Patient Instructions on file for this visit.     Signed, Freada Bergeron, MD  03/31/2021 9:01 PM    [facility]

## 2021-04-14 ENCOUNTER — Encounter: Payer: Self-pay | Admitting: Cardiology

## 2021-04-14 ENCOUNTER — Other Ambulatory Visit: Payer: Self-pay

## 2021-04-14 ENCOUNTER — Ambulatory Visit: Payer: Medicare PPO | Admitting: Cardiology

## 2021-04-14 VITALS — BP 124/72 | HR 90 | Ht 64.0 in | Wt 144.2 lb

## 2021-04-14 DIAGNOSIS — I10 Essential (primary) hypertension: Secondary | ICD-10-CM

## 2021-04-14 DIAGNOSIS — I495 Sick sinus syndrome: Secondary | ICD-10-CM

## 2021-04-14 DIAGNOSIS — Z950 Presence of cardiac pacemaker: Secondary | ICD-10-CM | POA: Diagnosis not present

## 2021-04-14 DIAGNOSIS — E7800 Pure hypercholesterolemia, unspecified: Secondary | ICD-10-CM | POA: Diagnosis not present

## 2021-04-14 DIAGNOSIS — G250 Essential tremor: Secondary | ICD-10-CM

## 2021-04-14 MED ORDER — PROPRANOLOL HCL 40 MG PO TABS: 40.0000 mg | ORAL_TABLET | Freq: Two times a day (BID) | ORAL | 3 refills | Status: DC

## 2021-04-14 NOTE — Progress Notes (Signed)
Cardiology Office Note:    Date:  04/14/2021   ID:  Michelle Fuller, DOB 05/03/1935, MRN [id number]  PCP:  Sueanne Margarita, DO  [facility] Cardiologist:  Freada Bergeron, MD  [facility] Electrophysiologist:  Vickie Epley, MD   Referring MD: Altheimer, Legrand Como, MD    History of Present Illness:    Michelle Fuller is a 86 y.o. female with a hx of  HTN, mobitz type II AVB, CVA, breast cancer s/p XRT, HLD and bradycardia who presents to clinic for follow-up  During our initial visit on 04/01/20, the patient was experiencing HR in the 30s with associated lightheadedness. Her propranolol was weaned with improvement in HR to 50s but tremors increased significantly. We referred her to EP and she continued to have 2:1 AVB on their exam. She therefore underwent dual chamber PPM placement on 05/26/20.  During our last visit on 07/03/20, she was doing well. Her propranolol had been increased with significant improvement in her tremors. Saw Dr. Quentin Ore on 09/16/20 where she was doing very well.   Today, she has been doing well. The propanolol has been improving her tremors. No chest pain, SOB, lightheadedness or dizziness. Has been struggling from insomnia and chronic back pain but states she is doing well from a CV standpoint.   She denies any palpitations, chest pain, lightheadedness, headaches, syncope, orthopnea, PND, lower extremity edema or exertional symptoms.  Past Medical History:  Diagnosis Date   Abnormal Pap smear of cervix    Breast cancer ([place])    Right breast, status post XRT   Cataracts, both eyes    Chest pain 2015   recent episode while in sun- recovered in shade with water   Colon polyp    Depression    Diverticulitis    Diverticulosis    Elevated LDL cholesterol level    Essential tremor    Goiter    multinodular   Hyperlipidemia    Hypertension    Osteopenia 8/97   Spine   Ovarian cyst    found on CT scan   Post-menopausal bleeding 01/2000    Huge polyps exc   Syncope    Vitamin D deficiency     Past Surgical History:  Procedure Laterality Date   Bilateral cateract surgery  2014   BREAST LUMPECTOMY Right 4/96   HYSTEROSCOPY  0/01   LAPAROSCOPIC BILATERAL SALPINGO OOPHERECTOMY Bilateral 01/07/2014   Procedure: LAPAROSCOPIC BILATERAL SALPINGO OOPHORECTOMY;  Surgeon: Lyman Speller, MD;  Location: [facility];  Service: Gynecology;  Laterality: Bilateral;   PACEMAKER IMPLANT N/A 05/26/2020   Procedure: PACEMAKER IMPLANT;  Surgeon: Vickie Epley, MD;  Location: [facility];  Service: Cardiovascular;  Laterality: N/A;    Current Medications: Current Meds  Medication Sig   amLODipine (NORVASC) 5 MG tablet Take 1 tablet (5 mg total) by mouth daily.   aspirin 81 MG tablet Take 1 tablet (81 mg total) by mouth daily.   atorvastatin (LIPITOR) 10 MG tablet Take 10 mg by mouth every morning.    Calcium Citrate-Vitamin D (CITRACAL PETITES/VITAMIN D PO) Take 2 tablets by mouth 2 (two) times daily.   carboxymethylcellulose (REFRESH PLUS) 0.5 % SOLN Place 1 drop into both eyes 2 (two) times daily.   ciprofloxacin (CIPRO) 250 MG tablet Take 250 mg by mouth 2 (two) times daily between meals as needed (UTI).   clonazePAM (KLONOPIN) 0.5 MG tablet Take 0.25 mg by mouth at bedtime.   Flaxseed, Linseed, (SM FLAX SEED OIL) 1000  MG CAPS Take 1,000 mg by mouth every morning.    indapamide (LOZOL) 1.25 MG tablet Take 1.25 mg by mouth every morning.    Lactobacillus Rhamnosus, GG, (CULTURELLE PO) Take 1 capsule by mouth every morning.    Multiple Vitamin (MULTIVITAMIN WITH MINERALS) TABS tablet Take 1 tablet by mouth every morning.   omeprazole (PRILOSEC) 20 MG capsule Take 20 mg by mouth See admin instructions. Take 20 mg every day for two every four months   polyethylene glycol powder (GLYCOLAX/MIRALAX) powder Take 17 g by mouth daily as needed for moderate constipation or severe constipation.   primidone (MYSOLINE) 50 MG tablet Take 3  tablets (150 mg total) by mouth daily with breakfast.   valsartan (DIOVAN) 320 MG tablet Take 320 mg by mouth every morning.    [DISCONTINUED] propranolol (INDERAL) 40 MG tablet TAKE 1 TABLET BY MOUTH TWICE A DAY     Allergies:   Clonidine, Hydrocodone-acetaminophen, Latex, Flagyl [metronidazole], Penicillins, Shingrix [zoster vac recomb adjuvanted], and Sulfamethoxazole-trimethoprim   Social History   Socioeconomic History   Marital status: Widowed    Spouse name: Not on file   Number of children: 3   Years of education: BA   Highest education level: Not on file  Occupational History   Occupation: Retired Product manager: RETIRED  Tobacco Use   Smoking status: Never   Smokeless tobacco: Never  Substance and Sexual Activity   Alcohol use: No    Alcohol/week: 0.0 standard drinks   Drug use: No   Sexual activity: Not Currently    Partners: Male  Other Topics Concern   Not on file  Social History Narrative   Widowed.  Lives alone.  Ambulates without assistance.   Drinks about 1 cup of green tea a day    Social Determinants of Radio broadcast assistant Strain: Not on file  Food Insecurity: Not on file  Transportation Needs: Not on file  Physical Activity: Not on file  Stress: Not on file  Social Connections: Not on file     Family History: The patient's family history includes Alzheimer's disease in her father; Heart attack in her mother; Heart disease in her mother; Stroke in her maternal grandmother; Thyroid disease in her maternal grandmother and mother.  ROS:   Please see the history of present illness.    Review of Systems  Constitutional:  Negative for chills and fever.  HENT:  Negative for nosebleeds.   Eyes:  Negative for blurred vision.  Respiratory:  Negative for shortness of breath.   Cardiovascular:  Negative for chest pain, palpitations, orthopnea, claudication, leg swelling and PND.  Gastrointestinal:  Negative for melena, nausea and vomiting.   Genitourinary:  Negative for flank pain.  Musculoskeletal:  Positive for back pain. Negative for falls and joint pain.  Skin:  Negative for itching and rash.  Neurological:  Positive for tremors. Negative for dizziness.  Endo/Heme/Allergies:  Negative for polydipsia.  Psychiatric/Behavioral:  Positive for depression. The patient has insomnia.    EKGs/Labs/Other Studies Reviewed:    The following studies were reviewed today: TTE 05-31-20: IMPRESSIONS   1. Left ventricular ejection fraction, by estimation, is 60 to 65%. The  left ventricle has normal function. The left ventricle has no regional  wall motion abnormalities. Left ventricular diastolic parameters are  indeterminate.   2. Right ventricular systolic function is normal. The right ventricular  size is normal. There is normal pulmonary artery systolic pressure.   3. Left atrial size was mildly  dilated.   4. The mitral valve is normal in structure. Mild mitral valve  regurgitation. No evidence of mitral stenosis.   5. The aortic valve was not well visualized. Aortic valve regurgitation  is mild. Mild aortic valve sclerosis is present, with no evidence of  aortic valve stenosis.   6. The inferior vena cava is normal in size with greater than 50%  respiratory variability, suggesting right atrial pressure of 3 mmHg.  Myoview 2016: Nuclear stress EF: 66%. There was no ST segment deviation noted during stress. This is a low risk study. The left ventricular ejection fraction is hyperdynamic (>65%). 1. Fixed small, mild basal anterolateral perfusion defect.  Given normal wall motion, this likely represents attenuation.  No ischemia.  2. Normal wall motion and normal EF. 3. Low risk study.   EKG: 04/14/21: EKG was not ordered today  Recent Labs: 05/20/2020: BUN 18; Creatinine, Ser 0.79; Hemoglobin 16.2; Platelets 254; Potassium 4.1; Sodium 137  Recent Lipid Panel    Component Value Date/Time   CHOL 154 01/16/2013 0356    TRIG 197 (H) 01/16/2013 0356   HDL 36 (L) 01/16/2013 0356   CHOLHDL 4.3 01/16/2013 0356   VLDL 39 01/16/2013 0356   LDLCALC 79 01/16/2013 0356     Physical Exam:    VS:  BP 124/72   Pulse 90   Ht 5\' 4"  (1.626 m)   Wt 144 lb 3.2 oz (65.4 kg)   LMP 05/25/1999   SpO2 95%   BMI 24.75 kg/m     Wt Readings from Last 3 Encounters:  04/14/21 144 lb 3.2 oz (65.4 kg)  09/16/20 140 lb 3.2 oz (63.6 kg)  07/03/20 139 lb 9.6 oz (63.3 kg)     GEN:  Well nourished, well developed in no acute distress HEENT: Normal NECK: No JVD; No carotid bruits CARDIAC: RRR, no murmurs, rubs, gallops. Pacemaker insertion site c/d/i. No erythema or hematoma RESPIRATORY:  Clear to auscultation without rales, wheezing or rhonchi  ABDOMEN: Soft, non-tender, non-distended MUSCULOSKELETAL:  No edema; No deformity  SKIN: Warm and dry NEUROLOGIC:  Alert and oriented x 3 PSYCHIATRIC:  Normal affect   ASSESSMENT:    1. Sick sinus syndrome ([place])   2. Pacemaker   3. Pure hypercholesterolemia   4. Primary hypertension   5. Essential tremor     PLAN:    In order of problems listed above:  #Symptomatic bradycardia #High grade AVB Patient with continued high degree AVB despite stopping propranolol. Also with significant worsening of essential tremors while off the BB. After evaluation by EP, patient underwent successful dual chamber PPM placement.  -S/p dual chamber PPM placement -Follow-up with EP as scheduled   #HTN: Well controlled today -Continue amlodipine 5mg  daily  -Continue home valsartan and lozol  -Continue propranolol 40mg  BID   #HLD Managed by PCP. -Continue home atorva 10mg  daily -Repeat labs with PCP  #Essential Tremor: -Resumed propranolol 40mg  BID with significant improvement   Medication Adjustments/Labs and Tests Ordered: Current medicines are reviewed at length with the patient today.  Concerns regarding medicines are outlined above.  No orders of the defined types were  placed in this encounter.  Meds ordered this encounter  Medications   propranolol (INDERAL) 40 MG tablet    Sig: Take 1 tablet (40 mg total) by mouth 2 (two) times daily.    Dispense:  180 tablet    Refill:  3     Patient Instructions  Medication Instructions:   Your physician recommends that you  continue on your current medications as directed. Please refer to the Current Medication list given to you today.  *If you need a refill on your cardiac medications before your next appointment, please call your pharmacy*   Follow-Up: At [facility], you and your health needs are our priority.  As part of our continuing mission to provide you with exceptional heart care, we have created designated Provider Care Teams.  These Care Teams include your primary Cardiologist (physician) and Advanced Practice Providers (APPs -  Physician Assistants and Nurse Practitioners) who all work together to provide you with the care you need, when you need it.  We recommend signing up for the patient portal called "MyChart".  Sign up information is provided on this After Visit Summary.  MyChart is used to connect with patients for Virtual Visits (Telemedicine).  Patients are able to view lab/test results, encounter notes, upcoming appointments, etc.  Non-urgent messages can be sent to your provider as well.   To learn more about what you can do with MyChart, go to [email or web address].    Your next appointment:   1 year(s)  The format for your next appointment:   In Person  Provider:   Freada Bergeron, MD      Wilhemina Bonito as a scribe for Freada Bergeron, MD.,have documented all relevant documentation on the behalf of Freada Bergeron, MD,as directed by  Freada Bergeron, MD while in the presence of Freada Bergeron, MD.  I, Freada Bergeron, MD, have reviewed all documentation for this visit. The documentation on 04/14/21 for the exam, diagnosis, procedures, and  orders are all accurate and complete.   Signed, Freada Bergeron, MD  04/14/2021 4:18 PM    [facility]

## 2021-04-14 NOTE — Patient Instructions (Signed)
Medication Instructions:   Your physician recommends that you continue on your current medications as directed. Please refer to the Current Medication list given to you today.  *If you need a refill on your cardiac medications before your next appointment, please call your pharmacy*   Follow-Up: At [facility], you and your health needs are our priority.  As part of our continuing mission to provide you with exceptional heart care, we have created designated Provider Care Teams.  These Care Teams include your primary Cardiologist (physician) and Advanced Practice Providers (APPs -  Physician Assistants and Nurse Practitioners) who all work together to provide you with the care you need, when you need it.  We recommend signing up for the patient portal called "MyChart".  Sign up information is provided on this After Visit Summary.  MyChart is used to connect with patients for Virtual Visits (Telemedicine).  Patients are able to view lab/test results, encounter notes, upcoming appointments, etc.  Non-urgent messages can be sent to your provider as well.   To learn more about what you can do with MyChart, go to [email or web address].    Your next appointment:   1 year(s)  The format for your next appointment:   In Person  Provider:   Freada Bergeron, MD

## 2021-05-07 DIAGNOSIS — J029 Acute pharyngitis, unspecified: Secondary | ICD-10-CM | POA: Diagnosis not present

## 2021-05-07 DIAGNOSIS — Z20822 Contact with and (suspected) exposure to covid-19: Secondary | ICD-10-CM | POA: Diagnosis not present

## 2021-05-07 DIAGNOSIS — J069 Acute upper respiratory infection, unspecified: Secondary | ICD-10-CM | POA: Diagnosis not present

## 2021-05-07 DIAGNOSIS — R5383 Other fatigue: Secondary | ICD-10-CM | POA: Diagnosis not present

## 2021-05-14 DIAGNOSIS — R1319 Other dysphagia: Secondary | ICD-10-CM | POA: Diagnosis not present

## 2021-05-14 DIAGNOSIS — M810 Age-related osteoporosis without current pathological fracture: Secondary | ICD-10-CM | POA: Diagnosis not present

## 2021-05-14 DIAGNOSIS — E785 Hyperlipidemia, unspecified: Secondary | ICD-10-CM | POA: Diagnosis not present

## 2021-05-14 DIAGNOSIS — I495 Sick sinus syndrome: Secondary | ICD-10-CM | POA: Diagnosis not present

## 2021-05-14 DIAGNOSIS — I10 Essential (primary) hypertension: Secondary | ICD-10-CM | POA: Diagnosis not present

## 2021-05-14 DIAGNOSIS — E042 Nontoxic multinodular goiter: Secondary | ICD-10-CM | POA: Diagnosis not present

## 2021-05-14 DIAGNOSIS — R251 Tremor, unspecified: Secondary | ICD-10-CM | POA: Diagnosis not present

## 2021-05-14 DIAGNOSIS — G4700 Insomnia, unspecified: Secondary | ICD-10-CM | POA: Diagnosis not present

## 2021-05-28 ENCOUNTER — Ambulatory Visit (INDEPENDENT_AMBULATORY_CARE_PROVIDER_SITE_OTHER): Payer: Medicare PPO

## 2021-05-28 DIAGNOSIS — I495 Sick sinus syndrome: Secondary | ICD-10-CM

## 2021-05-28 LAB — CUP PACEART REMOTE DEVICE CHECK
Battery Remaining Longevity: 110 mo
Battery Remaining Percentage: 91 %
Battery Voltage: 3.01 V
Brady Statistic AP VP Percent: 2.5 %
Brady Statistic AP VS Percent: 1 %
Brady Statistic AS VP Percent: 97 %
Brady Statistic AS VS Percent: 1 %
Brady Statistic RA Percent Paced: 2.2 %
Brady Statistic RV Percent Paced: 99 %
Date Time Interrogation Session: 20230105022224
Implantable Lead Implant Date: 20220103
Implantable Lead Implant Date: 20220103
Implantable Lead Location: 753859
Implantable Lead Location: 753860
Implantable Pulse Generator Implant Date: 20220103
Lead Channel Impedance Value: 440 Ohm
Lead Channel Impedance Value: 580 Ohm
Lead Channel Pacing Threshold Amplitude: 0.5 V
Lead Channel Pacing Threshold Amplitude: 0.75 V
Lead Channel Pacing Threshold Pulse Width: 0.5 ms
Lead Channel Pacing Threshold Pulse Width: 0.5 ms
Lead Channel Sensing Intrinsic Amplitude: 12 mV
Lead Channel Sensing Intrinsic Amplitude: 2.5 mV
Lead Channel Setting Pacing Amplitude: 1 V
Lead Channel Setting Pacing Amplitude: 2 V
Lead Channel Setting Pacing Pulse Width: 0.5 ms
Lead Channel Setting Sensing Sensitivity: 2 mV
Pulse Gen Model: 2272
Pulse Gen Serial Number: 3885899

## 2021-06-02 ENCOUNTER — Other Ambulatory Visit: Payer: Self-pay

## 2021-06-02 MED ORDER — AMLODIPINE BESYLATE 5 MG PO TABS: 5.0000 mg | ORAL_TABLET | Freq: Every day | ORAL | 3 refills | Status: DC

## 2021-06-08 NOTE — Progress Notes (Signed)
Remote pacemaker transmission.   

## 2021-08-27 ENCOUNTER — Ambulatory Visit (INDEPENDENT_AMBULATORY_CARE_PROVIDER_SITE_OTHER): Payer: Medicare PPO

## 2021-08-27 DIAGNOSIS — I495 Sick sinus syndrome: Secondary | ICD-10-CM | POA: Diagnosis not present

## 2021-08-27 LAB — CUP PACEART REMOTE DEVICE CHECK
Battery Remaining Longevity: 108 mo
Battery Remaining Percentage: 89 %
Battery Voltage: 3.01 V
Brady Statistic AP VP Percent: 3.4 %
Brady Statistic AP VS Percent: 1 %
Brady Statistic AS VP Percent: 96 %
Brady Statistic AS VS Percent: 1 %
Brady Statistic RA Percent Paced: 3 %
Brady Statistic RV Percent Paced: 99 %
Date Time Interrogation Session: 20230406020015
Implantable Lead Implant Date: 20220103
Implantable Lead Implant Date: 20220103
Implantable Lead Location: 753859
Implantable Lead Location: 753860
Implantable Pulse Generator Implant Date: 20220103
Lead Channel Impedance Value: 480 Ohm
Lead Channel Impedance Value: 590 Ohm
Lead Channel Pacing Threshold Amplitude: 0.5 V
Lead Channel Pacing Threshold Amplitude: 0.75 V
Lead Channel Pacing Threshold Pulse Width: 0.5 ms
Lead Channel Pacing Threshold Pulse Width: 0.5 ms
Lead Channel Sensing Intrinsic Amplitude: 12 mV
Lead Channel Sensing Intrinsic Amplitude: 2.4 mV
Lead Channel Setting Pacing Amplitude: 1 V
Lead Channel Setting Pacing Amplitude: 2 V
Lead Channel Setting Pacing Pulse Width: 0.5 ms
Lead Channel Setting Sensing Sensitivity: 2 mV
Pulse Gen Model: 2272
Pulse Gen Serial Number: 3885899

## 2021-09-11 NOTE — Progress Notes (Signed)
Remote pacemaker transmission.   

## 2021-09-15 DIAGNOSIS — E785 Hyperlipidemia, unspecified: Secondary | ICD-10-CM | POA: Diagnosis not present

## 2021-09-15 DIAGNOSIS — I10 Essential (primary) hypertension: Secondary | ICD-10-CM | POA: Diagnosis not present

## 2021-09-15 DIAGNOSIS — E042 Nontoxic multinodular goiter: Secondary | ICD-10-CM | POA: Diagnosis not present

## 2021-09-22 DIAGNOSIS — Z1331 Encounter for screening for depression: Secondary | ICD-10-CM | POA: Diagnosis not present

## 2021-09-22 DIAGNOSIS — R251 Tremor, unspecified: Secondary | ICD-10-CM | POA: Diagnosis not present

## 2021-09-22 DIAGNOSIS — Z0000 Encounter for general adult medical examination without abnormal findings: Secondary | ICD-10-CM | POA: Diagnosis not present

## 2021-09-22 DIAGNOSIS — E785 Hyperlipidemia, unspecified: Secondary | ICD-10-CM | POA: Diagnosis not present

## 2021-09-22 DIAGNOSIS — D692 Other nonthrombocytopenic purpura: Secondary | ICD-10-CM | POA: Diagnosis not present

## 2021-09-22 DIAGNOSIS — I495 Sick sinus syndrome: Secondary | ICD-10-CM | POA: Diagnosis not present

## 2021-09-22 DIAGNOSIS — N390 Urinary tract infection, site not specified: Secondary | ICD-10-CM | POA: Diagnosis not present

## 2021-09-22 DIAGNOSIS — I10 Essential (primary) hypertension: Secondary | ICD-10-CM | POA: Diagnosis not present

## 2021-09-22 DIAGNOSIS — M810 Age-related osteoporosis without current pathological fracture: Secondary | ICD-10-CM | POA: Diagnosis not present

## 2021-11-26 ENCOUNTER — Ambulatory Visit (INDEPENDENT_AMBULATORY_CARE_PROVIDER_SITE_OTHER): Payer: Medicare PPO

## 2021-11-26 DIAGNOSIS — I495 Sick sinus syndrome: Secondary | ICD-10-CM | POA: Diagnosis not present

## 2021-11-26 LAB — CUP PACEART REMOTE DEVICE CHECK
Battery Remaining Longevity: 104 mo
Battery Remaining Percentage: 86 %
Battery Voltage: 3.01 V
Brady Statistic AP VP Percent: 3.5 %
Brady Statistic AP VS Percent: 1 %
Brady Statistic AS VP Percent: 96 %
Brady Statistic AS VS Percent: 1 %
Brady Statistic RA Percent Paced: 3.1 %
Brady Statistic RV Percent Paced: 99 %
Date Time Interrogation Session: 20230706020014
Implantable Lead Implant Date: 20220103
Implantable Lead Implant Date: 20220103
Implantable Lead Location: 753859
Implantable Lead Location: 753860
Implantable Pulse Generator Implant Date: 20220103
Lead Channel Impedance Value: 540 Ohm
Lead Channel Impedance Value: 550 Ohm
Lead Channel Pacing Threshold Amplitude: 0.5 V
Lead Channel Pacing Threshold Amplitude: 0.75 V
Lead Channel Pacing Threshold Pulse Width: 0.5 ms
Lead Channel Pacing Threshold Pulse Width: 0.5 ms
Lead Channel Sensing Intrinsic Amplitude: 12 mV
Lead Channel Sensing Intrinsic Amplitude: 3.4 mV
Lead Channel Setting Pacing Amplitude: 1 V
Lead Channel Setting Pacing Amplitude: 2 V
Lead Channel Setting Pacing Pulse Width: 0.5 ms
Lead Channel Setting Sensing Sensitivity: 2 mV
Pulse Gen Model: 2272
Pulse Gen Serial Number: 3885899

## 2021-12-14 NOTE — Progress Notes (Signed)
Remote pacemaker transmission.   

## 2022-02-25 ENCOUNTER — Ambulatory Visit (INDEPENDENT_AMBULATORY_CARE_PROVIDER_SITE_OTHER): Payer: Medicare PPO

## 2022-02-25 DIAGNOSIS — I495 Sick sinus syndrome: Secondary | ICD-10-CM

## 2022-02-25 LAB — CUP PACEART REMOTE DEVICE CHECK
Battery Remaining Longevity: 101 mo
Battery Remaining Percentage: 84 %
Battery Voltage: 3.01 V
Brady Statistic AP VP Percent: 3.4 %
Brady Statistic AP VS Percent: 1 %
Brady Statistic AS VP Percent: 96 %
Brady Statistic AS VS Percent: 1 %
Brady Statistic RA Percent Paced: 3 %
Brady Statistic RV Percent Paced: 99 %
Date Time Interrogation Session: 20231005020012
Implantable Lead Implant Date: 20220103
Implantable Lead Implant Date: 20220103
Implantable Lead Location: 753859
Implantable Lead Location: 753860
Implantable Pulse Generator Implant Date: 20220103
Lead Channel Impedance Value: 530 Ohm
Lead Channel Impedance Value: 550 Ohm
Lead Channel Pacing Threshold Amplitude: 0.5 V
Lead Channel Pacing Threshold Amplitude: 0.75 V
Lead Channel Pacing Threshold Pulse Width: 0.5 ms
Lead Channel Pacing Threshold Pulse Width: 0.5 ms
Lead Channel Sensing Intrinsic Amplitude: 12 mV
Lead Channel Sensing Intrinsic Amplitude: 2.4 mV
Lead Channel Setting Pacing Amplitude: 1 V
Lead Channel Setting Pacing Amplitude: 2 V
Lead Channel Setting Pacing Pulse Width: 0.5 ms
Lead Channel Setting Sensing Sensitivity: 2 mV
Pulse Gen Model: 2272
Pulse Gen Serial Number: 3885899

## 2022-03-04 NOTE — Progress Notes (Signed)
Remote pacemaker transmission.   

## 2022-03-15 DIAGNOSIS — H04123 Dry eye syndrome of bilateral lacrimal glands: Secondary | ICD-10-CM | POA: Diagnosis not present

## 2022-03-15 DIAGNOSIS — H524 Presbyopia: Secondary | ICD-10-CM | POA: Diagnosis not present

## 2022-03-30 DIAGNOSIS — R1319 Other dysphagia: Secondary | ICD-10-CM | POA: Diagnosis not present

## 2022-03-30 DIAGNOSIS — G4700 Insomnia, unspecified: Secondary | ICD-10-CM | POA: Diagnosis not present

## 2022-03-30 DIAGNOSIS — N390 Urinary tract infection, site not specified: Secondary | ICD-10-CM | POA: Diagnosis not present

## 2022-03-30 DIAGNOSIS — D692 Other nonthrombocytopenic purpura: Secondary | ICD-10-CM | POA: Diagnosis not present

## 2022-03-30 DIAGNOSIS — I10 Essential (primary) hypertension: Secondary | ICD-10-CM | POA: Diagnosis not present

## 2022-03-30 DIAGNOSIS — R251 Tremor, unspecified: Secondary | ICD-10-CM | POA: Diagnosis not present

## 2022-03-30 DIAGNOSIS — E785 Hyperlipidemia, unspecified: Secondary | ICD-10-CM | POA: Diagnosis not present

## 2022-03-30 DIAGNOSIS — M810 Age-related osteoporosis without current pathological fracture: Secondary | ICD-10-CM | POA: Diagnosis not present

## 2022-03-30 DIAGNOSIS — I495 Sick sinus syndrome: Secondary | ICD-10-CM | POA: Diagnosis not present

## 2022-04-27 NOTE — Progress Notes (Unsigned)
Cardiology Office Note:    Date:  04/27/2022   ID:  Michelle Fuller, DOB 1935-04-03, MRN [id number]  PCP:  Sueanne Margarita, DO  [facility] Cardiologist:  Freada Bergeron, MD  [facility] Electrophysiologist:  Vickie Epley, MD   Referring MD: Sueanne Margarita, DO    History of Present Illness:    Michelle Fuller is a 87 y.o. female with a hx of  HTN, mobitz type II AVB, CVA, breast cancer s/p XRT, HLD and bradycardia who presents to clinic for follow-up  During our initial visit on 04/01/20, the patient was experiencing HR in the 30s with associated lightheadedness. Her propranolol was weaned with improvement in HR to 50s but tremors increased significantly. We referred her to EP and she continued to have 2:1 AVB on their exam. She therefore underwent dual chamber PPM placement on 05/26/20.  During visit on 07/03/20, she was doing well. Her propranolol had been increased with significant improvement in her tremors. Saw Dr. Quentin Ore on 09/16/20 where she was doing very well.   Was last seen in clinic on 03/2021 where she was doing well. Propranolol had helped her tremors.   Today,***  Past Medical History:  Diagnosis Date   Abnormal Pap smear of cervix    Breast cancer ([place])    Right breast, status post XRT   Cataracts, both eyes    Chest pain 2015   recent episode while in sun- recovered in shade with water   Colon polyp    Depression    Diverticulitis    Diverticulosis    Elevated LDL cholesterol level    Essential tremor    Goiter    multinodular   Hyperlipidemia    Hypertension    Osteopenia 8/97   Spine   Ovarian cyst    found on CT scan   Post-menopausal bleeding 01/2000   Huge polyps exc   Syncope    Vitamin D deficiency     Past Surgical History:  Procedure Laterality Date   Bilateral cateract surgery  2014   BREAST LUMPECTOMY Right 4/96   HYSTEROSCOPY  0/01   LAPAROSCOPIC BILATERAL SALPINGO OOPHERECTOMY Bilateral 01/07/2014   Procedure:  LAPAROSCOPIC BILATERAL SALPINGO OOPHORECTOMY;  Surgeon: Lyman Speller, MD;  Location: [place] [facility];  Service: Gynecology;  Laterality: Bilateral;   PACEMAKER IMPLANT N/A 05/26/2020   Procedure: PACEMAKER IMPLANT;  Surgeon: Vickie Epley, MD;  Location: [place] [facility];  Service: Cardiovascular;  Laterality: N/A;    Current Medications: No outpatient medications have been marked as taking for the 04/29/22 encounter (Appointment) with Freada Bergeron, MD.     Allergies:   Clonidine, Hydrocodone-acetaminophen, Latex, Flagyl [metronidazole], Penicillins, Shingrix [zoster vac recomb adjuvanted], and Sulfamethoxazole-trimethoprim   Social History   Socioeconomic History   Marital status: Widowed    Spouse name: Not on file   Number of children: 3   Years of education: BA   Highest education level: Not on file  Occupational History   Occupation: Retired Product manager: RETIRED  Tobacco Use   Smoking status: Never   Smokeless tobacco: Never  Substance and Sexual Activity   Alcohol use: No    Alcohol/week: 0.0 standard drinks of alcohol   Drug use: No   Sexual activity: Not Currently    Partners: Male  Other Topics Concern   Not on file  Social History Narrative   Widowed.  Lives alone.  Ambulates without assistance.   Drinks about 1 cup of green  tea a day    Social Determinants of Radio broadcast assistant Strain: Not on file  Food Insecurity: Not on file  Transportation Needs: Not on file  Physical Activity: Not on file  Stress: Not on file  Social Connections: Not on file     Family History: The patient's family history includes Alzheimer's disease in her father; Heart attack in her mother; Heart disease in her mother; Stroke in her maternal grandmother; Thyroid disease in her maternal grandmother and mother.  ROS:   Please see the history of present illness.    Review of Systems  Constitutional:  Negative for chills and fever.  HENT:  Negative for  nosebleeds.   Eyes:  Negative for blurred vision.  Respiratory:  Negative for shortness of breath.   Cardiovascular:  Negative for chest pain, palpitations, orthopnea, claudication, leg swelling and PND.  Gastrointestinal:  Negative for melena, nausea and vomiting.  Genitourinary:  Negative for flank pain.  Musculoskeletal:  Positive for back pain. Negative for falls and joint pain.  Skin:  Negative for itching and rash.  Neurological:  Positive for tremors. Negative for dizziness.  Endo/Heme/Allergies:  Negative for polydipsia.  Psychiatric/Behavioral:  Positive for depression. The patient has insomnia.     EKGs/Labs/Other Studies Reviewed:    The following studies were reviewed today: TTE 05-26-20: IMPRESSIONS   1. Left ventricular ejection fraction, by estimation, is 60 to 65%. The  left ventricle has normal function. The left ventricle has no regional  wall motion abnormalities. Left ventricular diastolic parameters are  indeterminate.   2. Right ventricular systolic function is normal. The right ventricular  size is normal. There is normal pulmonary artery systolic pressure.   3. Left atrial size was mildly dilated.   4. The mitral valve is normal in structure. Mild mitral valve  regurgitation. No evidence of mitral stenosis.   5. The aortic valve was not well visualized. Aortic valve regurgitation  is mild. Mild aortic valve sclerosis is present, with no evidence of  aortic valve stenosis.   6. The inferior vena cava is normal in size with greater than 50%  respiratory variability, suggesting right atrial pressure of 3 mmHg.  Myoview 2016: Nuclear stress EF: 66%. There was no ST segment deviation noted during stress. This is a low risk study. The left ventricular ejection fraction is hyperdynamic (>65%). 1. Fixed small, mild basal anterolateral perfusion defect.  Given normal wall motion, this likely represents attenuation.  No ischemia.  2. Normal wall motion and normal  EF. 3. Low risk study.   EKG: 04/14/21: EKG was not ordered today  Recent Labs: No results found for requested labs within last 365 days.  Recent Lipid Panel    Component Value Date/Time   CHOL 154 01/16/2013 0356   TRIG 197 (H) 01/16/2013 0356   HDL 36 (L) 01/16/2013 0356   CHOLHDL 4.3 01/16/2013 0356   VLDL 39 01/16/2013 0356   LDLCALC 79 01/16/2013 0356     Physical Exam:    VS:  LMP 05/25/1999     Wt Readings from Last 3 Encounters:  04/14/21 144 lb 3.2 oz (65.4 kg)  09/16/20 140 lb 3.2 oz (63.6 kg)  07/03/20 139 lb 9.6 oz (63.3 kg)     GEN:  Well nourished, well developed in no acute distress HEENT: Normal NECK: No JVD; No carotid bruits CARDIAC: RRR, no murmurs, rubs, gallops. Pacemaker insertion site c/d/i. No erythema or hematoma RESPIRATORY:  Clear to auscultation without rales, wheezing or rhonchi  ABDOMEN: Soft, non-tender, non-distended MUSCULOSKELETAL:  No edema; No deformity  SKIN: Warm and dry NEUROLOGIC:  Alert and oriented x 3 PSYCHIATRIC:  Normal affect   ASSESSMENT:    No diagnosis found.   PLAN:    In order of problems listed above:  #Symptomatic bradycardia #High grade AVB Patient with continued high degree AVB despite stopping propranolol. Also with significant worsening of essential tremors while off the BB. After evaluation by EP, patient underwent successful dual chamber PPM placement.  -S/p dual chamber PPM placement -Follow-up with EP as scheduled   #HTN: Well controlled today -Continue amlodipine '5mg'$  daily  -Continue home valsartan and lozol  -Continue propranolol '40mg'$  BID   #HLD Managed by PCP. -Continue home atorva '10mg'$  daily -Repeat labs per PCP  #Essential Tremor: -Resumed propranolol '40mg'$  BID with significant improvement   Medication Adjustments/Labs and Tests Ordered: Current medicines are reviewed at length with the patient today.  Concerns regarding medicines are outlined above.  No orders of the defined  types were placed in this encounter.  No orders of the defined types were placed in this encounter.    There are no Patient Instructions on file for this visit.   I,Mykaella Javier,acting as a scribe for Freada Bergeron, MD.,have documented all relevant documentation on the behalf of Freada Bergeron, MD,as directed by  Freada Bergeron, MD while in the presence of Freada Bergeron, MD.  I, Freada Bergeron, MD, have reviewed all documentation for this visit. The documentation on 04/27/22 for the exam, diagnosis, procedures, and orders are all accurate and complete.   Signed, Freada Bergeron, MD  04/27/2022 8:39 PM    [place]

## 2022-04-29 ENCOUNTER — Ambulatory Visit: Payer: Medicare PPO | Attending: Cardiology | Admitting: Cardiology

## 2022-04-29 ENCOUNTER — Encounter: Payer: Self-pay | Admitting: Cardiology

## 2022-04-29 VITALS — BP 130/80 | HR 84 | Ht 64.0 in | Wt 140.6 lb

## 2022-04-29 DIAGNOSIS — I495 Sick sinus syndrome: Secondary | ICD-10-CM | POA: Diagnosis not present

## 2022-04-29 DIAGNOSIS — E7800 Pure hypercholesterolemia, unspecified: Secondary | ICD-10-CM | POA: Diagnosis not present

## 2022-04-29 DIAGNOSIS — I441 Atrioventricular block, second degree: Secondary | ICD-10-CM | POA: Diagnosis not present

## 2022-04-29 DIAGNOSIS — G250 Essential tremor: Secondary | ICD-10-CM

## 2022-04-29 DIAGNOSIS — Z950 Presence of cardiac pacemaker: Secondary | ICD-10-CM

## 2022-04-29 DIAGNOSIS — I10 Essential (primary) hypertension: Secondary | ICD-10-CM | POA: Diagnosis not present

## 2022-04-29 MED ORDER — AMLODIPINE BESYLATE 5 MG PO TABS: 5.0000 mg | ORAL_TABLET | Freq: Every day | ORAL | 3 refills | Status: DC

## 2022-04-29 MED ORDER — VALSARTAN 320 MG PO TABS: 320.0000 mg | ORAL_TABLET | Freq: Every morning | ORAL | 2 refills | Status: DC

## 2022-04-29 MED ORDER — ATORVASTATIN CALCIUM 10 MG PO TABS: 10.0000 mg | ORAL_TABLET | Freq: Every morning | ORAL | 2 refills | Status: DC

## 2022-04-29 MED ORDER — PROPRANOLOL HCL 40 MG PO TABS: 40.0000 mg | ORAL_TABLET | Freq: Two times a day (BID) | ORAL | 3 refills | Status: DC

## 2022-04-29 NOTE — Progress Notes (Signed)
Cardiology Office Note:    Date:  04/29/2022   ID:  KAIJAH ABTS, DOB [birth year], MRN [id number]  PCP:  Sueanne Margarita, DO  [facility] Cardiologist:  Freada Bergeron, MD  [facility] Electrophysiologist:  Vickie Epley, MD   Referring MD: Sueanne Margarita, DO    History of Present Illness:    Michelle Fuller is a 87 y.o. female with a hx of  HTN, mobitz type II AVB, CVA, breast cancer s/p XRT, HLD and bradycardia who presents to clinic for follow-up  During our initial visit on 04/01/20, the patient was experiencing HR in the 30s with associated lightheadedness. Her propranolol was weaned with improvement in HR to 50s but tremors increased significantly. We referred her to EP and she continued to have 2:1 AVB on their exam. She therefore underwent dual chamber PPM placement on 05/26/20.  During visit on 07/03/20, she was doing well. Her propranolol had been increased with significant improvement in her tremors. Saw Dr. Quentin Ore on 09/16/20 where she was doing very well.   Was last seen in clinic on 03/2021 where she was doing well. Propranolol had helped her tremors.   Today, the patient states that she is getting tired more easily and having to take more rests. Overall she believes her symptoms are related to her age. However, she also wonders if her fatigue may be due to episodes of lower blood pressures. She does not monitor her BP at home.  She remains compliant with propranolol. She takes the propranolol in the morning and at night. She has tremors that present more when she is writing. The tremors do not bother her.   She has dry eyes and is taking eye drops 4x a day. She states that her vision is blurry as well.   She is also experiencing trouble sleeping.   She denies any palpitations, chest pain, shortness of breath, or peripheral edema. No lightheadedness, headaches, syncope, orthopnea, or PND.   Past Medical History:  Diagnosis Date   Abnormal Pap  smear of cervix    Breast cancer ([place])    Right breast, status post XRT   Cataracts, both eyes    Chest pain 2015   recent episode while in sun- recovered in shade with water   Colon polyp    Depression    Diverticulitis    Diverticulosis    Elevated LDL cholesterol level    Essential tremor    Goiter    multinodular   Hyperlipidemia    Hypertension    Osteopenia 8/97   Spine   Ovarian cyst    found on CT scan   Post-menopausal bleeding 01/2000   Huge polyps exc   Syncope    Vitamin D deficiency     Past Surgical History:  Procedure Laterality Date   Bilateral cateract surgery  2014   BREAST LUMPECTOMY Right 4/96   HYSTEROSCOPY  0/01   LAPAROSCOPIC BILATERAL SALPINGO OOPHERECTOMY Bilateral 01/07/2014   Procedure: LAPAROSCOPIC BILATERAL SALPINGO OOPHORECTOMY;  Surgeon: Lyman Speller, MD;  Location: [facility];  Service: Gynecology;  Laterality: Bilateral;   PACEMAKER IMPLANT N/A 05/26/2020   Procedure: PACEMAKER IMPLANT;  Surgeon: Vickie Epley, MD;  Location: [place] [facility];  Service: Cardiovascular;  Laterality: N/A;    Current Medications: Current Meds  Medication Sig   aspirin 81 MG tablet Take 1 tablet (81 mg total) by mouth daily.   Calcium Citrate-Vitamin D (CITRACAL PETITES/VITAMIN D PO) Take 2 tablets by mouth 2 (two)  times daily.   carboxymethylcellulose (REFRESH PLUS) 0.5 % SOLN Place 1 drop into both eyes 2 (two) times daily.   ciprofloxacin (CIPRO) 250 MG tablet Take 250 mg by mouth 2 (two) times daily between meals as needed (UTI).   clonazePAM (KLONOPIN) 0.5 MG tablet Take 0.25 mg by mouth at bedtime.   Flaxseed, Linseed, (SM FLAX SEED OIL) 1000 MG CAPS Take 1,000 mg by mouth every morning.    ibandronate (BONIVA) 150 MG tablet Take 150 mg by mouth every 30 (thirty) days.   indapamide (LOZOL) 1.25 MG tablet Take 1.25 mg by mouth every morning.    Lactobacillus Rhamnosus, GG, (CULTURELLE PO) Take 1 capsule by mouth every morning.    Multiple  Vitamin (MULTIVITAMIN WITH MINERALS) TABS tablet Take 1 tablet by mouth every morning.   omeprazole (PRILOSEC) 20 MG capsule Take 20 mg by mouth See admin instructions. Take 20 mg every day for two every four months   polyethylene glycol powder (GLYCOLAX/MIRALAX) powder Take 17 g by mouth daily as needed for moderate constipation or severe constipation.   primidone (MYSOLINE) 50 MG tablet Take 3 tablets (150 mg total) by mouth daily with breakfast.   [DISCONTINUED] amLODipine (NORVASC) 5 MG tablet Take 1 tablet (5 mg total) by mouth daily.   [DISCONTINUED] atorvastatin (LIPITOR) 10 MG tablet Take 10 mg by mouth every morning.    [DISCONTINUED] propranolol (INDERAL) 40 MG tablet Take 1 tablet (40 mg total) by mouth 2 (two) times daily.   [DISCONTINUED] valsartan (DIOVAN) 320 MG tablet Take 320 mg by mouth every morning.      Allergies:   Clonidine, Hydrocodone-acetaminophen, Latex, Flagyl [metronidazole], Penicillins, Shingrix [zoster vac recomb adjuvanted], and Sulfamethoxazole-trimethoprim   Social History   Socioeconomic History   Marital status: Widowed    Spouse name: Not on file   Number of children: 3   Years of education: BA   Highest education level: Not on file  Occupational History   Occupation: Retired Product manager: RETIRED  Tobacco Use   Smoking status: Never   Smokeless tobacco: Never  Substance and Sexual Activity   Alcohol use: No    Alcohol/week: 0.0 standard drinks of alcohol   Drug use: No   Sexual activity: Not Currently    Partners: Male  Other Topics Concern   Not on file  Social History Narrative   Widowed.  Lives alone.  Ambulates without assistance.   Drinks about 1 cup of green tea a day    Social Determinants of Radio broadcast assistant Strain: Not on file  Food Insecurity: Not on file  Transportation Needs: Not on file  Physical Activity: Not on file  Stress: Not on file  Social Connections: Not on file     Family History: The  patient's family history includes Alzheimer's disease in her father; Heart attack in her mother; Heart disease in her mother; Stroke in her maternal grandmother; Thyroid disease in her maternal grandmother and mother.  ROS:   Please see the history of present illness.    Review of Systems  Constitutional:  Positive for malaise/fatigue. Negative for chills and fever.  HENT:  Negative for nosebleeds.   Eyes:  Positive for blurred vision.  Respiratory:  Negative for shortness of breath.   Cardiovascular:  Negative for chest pain, palpitations, orthopnea, claudication, leg swelling and PND.  Gastrointestinal:  Negative for melena, nausea and vomiting.  Genitourinary:  Negative for flank pain.  Musculoskeletal:  Negative for falls and joint pain.  Skin:  Negative for itching and rash.  Neurological:  Positive for tremors. Negative for dizziness.  Endo/Heme/Allergies:  Negative for polydipsia.  Psychiatric/Behavioral:  The patient has insomnia.     EKGs/Labs/Other Studies Reviewed:    The following studies were reviewed today: Pacemaker Implant 05/26/2020: CONCLUSIONS:  1. Symptomatic 2:1 AV block 2. Successful dual chamber permanent pacemaker implantation 3. No early apparent complications. 4. Focal occlusion of the lateral L subclavian vein   TTE 05/01/20: IMPRESSIONS   1. Left ventricular ejection fraction, by estimation, is 60 to 65%. The  left ventricle has normal function. The left ventricle has no regional  wall motion abnormalities. Left ventricular diastolic parameters are  indeterminate.   2. Right ventricular systolic function is normal. The right ventricular  size is normal. There is normal pulmonary artery systolic pressure.   3. Left atrial size was mildly dilated.   4. The mitral valve is normal in structure. Mild mitral valve  regurgitation. No evidence of mitral stenosis.   5. The aortic valve was not well visualized. Aortic valve regurgitation  is mild. Mild  aortic valve sclerosis is present, with no evidence of  aortic valve stenosis.   6. The inferior vena cava is normal in size with greater than 50%  respiratory variability, suggesting right atrial pressure of 3 mmHg.  Myoview 2016: Nuclear stress EF: 66%. There was no ST segment deviation noted during stress. This is a low risk study. The left ventricular ejection fraction is hyperdynamic (>65%). 1. Fixed small, mild basal anterolateral perfusion defect.  Given normal wall motion, this likely represents attenuation.  No ischemia.  2. Normal wall motion and normal EF. 3. Low risk study.   EKG: EKG is personally reviewed.  04/29/2022: A-sensed.V-paced. Rate 84 bpm.  04/14/21: EKG was not ordered today  Recent Labs: No results found for requested labs within last 365 days.   Recent Lipid Panel    Component Value Date/Time   CHOL 154 01/16/2013 0356   TRIG 197 (H) 01/16/2013 0356   HDL 36 (L) 01/16/2013 0356   CHOLHDL 4.3 01/16/2013 0356   VLDL 39 01/16/2013 0356   LDLCALC 79 01/16/2013 0356     Physical Exam:    VS:  BP 130/80   Pulse 84   Ht '5\' 4"'$  (1.626 m)   Wt 140 lb 9.6 oz (63.8 kg)   LMP 05/25/1999   SpO2 96%   BMI 24.13 kg/m     Wt Readings from Last 3 Encounters:  04/29/22 140 lb 9.6 oz (63.8 kg)  04/14/21 144 lb 3.2 oz (65.4 kg)  09/16/20 140 lb 3.2 oz (63.6 kg)     GEN:  Well nourished, well developed in no acute distress HEENT: Normal NECK: No JVD; No carotid bruits CARDIAC: RRR, no murmurs, rubs, gallops.  RESPIRATORY:  Clear to auscultation without rales, wheezing or rhonchi  ABDOMEN: Soft, non-tender, non-distended MUSCULOSKELETAL:  No edema; No deformity  SKIN: Warm and dry NEUROLOGIC:  Alert and oriented x 3 PSYCHIATRIC:  Normal affect   ASSESSMENT:    1. Sick sinus syndrome ([place])   2. Pure hypercholesterolemia   3. Essential tremor   4. Pacemaker   5. AV block, 2nd degree   6. Primary hypertension     PLAN:    In order of problems  listed above:  #Symptomatic bradycardia #High grade AVB Patient with continued high degree AVB despite stopping propranolol. Also with significant worsening of essential tremors while off the BB. After evaluation by EP, patient underwent  successful dual chamber PPM placement.  -S/p dual chamber PPM placement -Follow-up with EP as scheduled   #HTN: Well controlled today. States she is having some fatigue at home. Discussed monitoring her blood pressure at home to ensure she is not having any lows. We can also adjust her propranolol dosing as needed.  -Continue amlodipine '5mg'$  daily  -Continue home valsartan and lozol  -Continue propranolol '40mg'$  BID   #HLD Managed by PCP. -Continue home atorva '10mg'$  daily -Repeat labs per PCP  #Essential Tremor: -Continue propranolol '40mg'$  BID; can adjust pending fatigue symptoms  Follow-up: 6 months  Medication Adjustments/Labs and Tests Ordered: Current medicines are reviewed at length with the patient today.  Concerns regarding medicines are outlined above.   Orders Placed This Encounter  Procedures   EKG 12-Lead   Meds ordered this encounter  Medications   propranolol (INDERAL) 40 MG tablet    Sig: Take 1 tablet (40 mg total) by mouth 2 (two) times daily.    Dispense:  180 tablet    Refill:  3   valsartan (DIOVAN) 320 MG tablet    Sig: Take 1 tablet (320 mg total) by mouth every morning.    Dispense:  90 tablet    Refill:  2   atorvastatin (LIPITOR) 10 MG tablet    Sig: Take 1 tablet (10 mg total) by mouth every morning.    Dispense:  90 tablet    Refill:  2   amLODipine (NORVASC) 5 MG tablet    Sig: Take 1 tablet (5 mg total) by mouth daily.    Dispense:  90 tablet    Refill:  3   Patient Instructions  Medication Instructions:   Your physician recommends that you continue on your current medications as directed. Please refer to the Current Medication list given to you today.  *If you need a refill on your cardiac medications  before your next appointment, please call your pharmacy*   Follow-Up: At [facility], you and your health needs are our priority.  As part of our continuing mission to provide you with exceptional heart care, we have created designated Provider Care Teams.  These Care Teams include your primary Cardiologist (physician) and Advanced Practice Providers (APPs -  Physician Assistants and Nurse Practitioners) who all work together to provide you with the care you need, when you need it.  We recommend signing up for the patient portal called "MyChart".  Sign up information is provided on this After Visit Summary.  MyChart is used to connect with patients for Virtual Visits (Telemedicine).  Patients are able to view lab/test results, encounter notes, upcoming appointments, etc.  Non-urgent messages can be sent to your provider as well.   To learn more about what you can do with MyChart, go to [email or web address].    Your next appointment:   6 month(s)  The format for your next appointment:   In Person  Provider:   Freada Bergeron, MD      Important Information About [place] as a scribe for Freada Bergeron, MD.,have documented all relevant documentation on the behalf of Freada Bergeron, MD,as directed by  Freada Bergeron, MD while in the presence of Freada Bergeron, MD.   I, Freada Bergeron, MD, have reviewed all documentation for this visit. The documentation on 04/29/22 for the exam, diagnosis, procedures, and orders are all accurate and complete.   Signed, Freada Bergeron, MD  04/29/2022 3:42 PM    [facility]

## 2022-04-29 NOTE — Patient Instructions (Signed)
Medication Instructions:   Your physician recommends that you continue on your current medications as directed. Please refer to the Current Medication list given to you today.  *If you need a refill on your cardiac medications before your next appointment, please call your pharmacy*   Follow-Up: At [facility], you and your health needs are our priority.  As part of our continuing mission to provide you with exceptional heart care, we have created designated Provider Care Teams.  These Care Teams include your primary Cardiologist (physician) and Advanced Practice Providers (APPs -  Physician Assistants and Nurse Practitioners) who all work together to provide you with the care you need, when you need it.  We recommend signing up for the patient portal called "MyChart".  Sign up information is provided on this After Visit Summary.  MyChart is used to connect with patients for Virtual Visits (Telemedicine).  Patients are able to view lab/test results, encounter notes, upcoming appointments, etc.  Non-urgent messages can be sent to your provider as well.   To learn more about what you can do with MyChart, go to [email or web address].    Your next appointment:   6 month(s)  The format for your next appointment:   In Person  Provider:   Heather E Pemberton, MD     Important Information About [place]       

## 2022-05-27 ENCOUNTER — Ambulatory Visit (INDEPENDENT_AMBULATORY_CARE_PROVIDER_SITE_OTHER): Payer: Medicare PPO

## 2022-05-27 DIAGNOSIS — I441 Atrioventricular block, second degree: Secondary | ICD-10-CM | POA: Diagnosis not present

## 2022-05-27 LAB — CUP PACEART REMOTE DEVICE CHECK
Battery Remaining Longevity: 97 mo
Battery Remaining Percentage: 81 %
Battery Voltage: 3.01 V
Brady Statistic AP VP Percent: 4 %
Brady Statistic AP VS Percent: 1 %
Brady Statistic AS VP Percent: 95 %
Brady Statistic AS VS Percent: 1 %
Brady Statistic RA Percent Paced: 3.3 %
Brady Statistic RV Percent Paced: 99 %
Date Time Interrogation Session: 20240104020012
Implantable Lead Connection Status: 753985
Implantable Lead Connection Status: 753985
Implantable Lead Implant Date: 20220103
Implantable Lead Implant Date: 20220103
Implantable Lead Location: 753859
Implantable Lead Location: 753860
Implantable Pulse Generator Implant Date: 20220103
Lead Channel Impedance Value: 530 Ohm
Lead Channel Impedance Value: 550 Ohm
Lead Channel Pacing Threshold Amplitude: 0.5 V
Lead Channel Pacing Threshold Amplitude: 0.875 V
Lead Channel Pacing Threshold Pulse Width: 0.5 ms
Lead Channel Pacing Threshold Pulse Width: 0.5 ms
Lead Channel Sensing Intrinsic Amplitude: 12 mV
Lead Channel Sensing Intrinsic Amplitude: 2.8 mV
Lead Channel Setting Pacing Amplitude: 1.125
Lead Channel Setting Pacing Amplitude: 2 V
Lead Channel Setting Pacing Pulse Width: 0.5 ms
Lead Channel Setting Sensing Sensitivity: 2 mV
Pulse Gen Model: 2272
Pulse Gen Serial Number: 3885899

## 2022-06-16 ENCOUNTER — Ambulatory Visit: Payer: Medicare PPO | Attending: Cardiology | Admitting: Cardiology

## 2022-06-16 ENCOUNTER — Encounter: Payer: Self-pay | Admitting: Cardiology

## 2022-06-16 VITALS — BP 126/86 | HR 85 | Ht 64.0 in | Wt 144.0 lb

## 2022-06-16 DIAGNOSIS — I441 Atrioventricular block, second degree: Secondary | ICD-10-CM | POA: Diagnosis not present

## 2022-06-16 DIAGNOSIS — I10 Essential (primary) hypertension: Secondary | ICD-10-CM | POA: Diagnosis not present

## 2022-06-16 DIAGNOSIS — Z950 Presence of cardiac pacemaker: Secondary | ICD-10-CM

## 2022-06-16 NOTE — Patient Instructions (Signed)
Medication Instructions:  Your physician recommends that you continue on your current medications as directed. Please refer to the Current Medication list given to you today.  *If you need a refill on your cardiac medications before your next appointment, please call your pharmacy*  Follow-Up: At  [facility]Care, you and your health needs are our priority.  As part of our continuing mission to provide you with exceptional heart care, we have created designated Provider Care Teams.  These Care Teams include your primary Cardiologist (physician) and Advanced Practice Providers (APPs -  Physician Assistants and Nurse Practitioners) who all work together to provide you with the care you need, when you need it.  Your next appointment:   1 year(s)  Provider:   You may see CAMERON T LAMBERT, MD or one of the following Advanced Practice Providers on your designated Care Team:   Renee Ursuy, PA-C Michael "Andy" Tillery, PA-C Suzann Riddle, NP    

## 2022-06-16 NOTE — Progress Notes (Signed)
Remote pacemaker transmission.   

## 2022-06-16 NOTE — Progress Notes (Deleted)
Electrophysiology Office Follow up Visit Note:    Date:  06/16/2022   ID:  Michelle Fuller, DOB 05-25-1934, MRN [id number]  PCP:  Sueanne Margarita, [place] Cardiologist:  Freada Bergeron, MD  [facility] Electrophysiologist:  Vickie Epley, MD    Interval History:    Michelle Fuller is a 88 y.o. female who presents for a follow up visit.  She has a history of pacemaker implant May 26, 2020.  Remote interrogations have shown stable device function since implant.       Past Medical History:  Diagnosis Date   Abnormal Pap smear of cervix    Breast cancer ([place])    Right breast, status post XRT   Cataracts, both eyes    Chest pain 2015   recent episode while in sun- recovered in shade with water   Colon polyp    Depression    Diverticulitis    Diverticulosis    Elevated LDL cholesterol level    Essential tremor    Goiter    multinodular   Hyperlipidemia    Hypertension    Osteopenia 8/97   Spine   Ovarian cyst    found on CT scan   Post-menopausal bleeding 01/2000   Huge polyps exc   Syncope    Vitamin D deficiency     Past Surgical History:  Procedure Laterality Date   Bilateral cateract surgery  2014   BREAST LUMPECTOMY Right 4/96   HYSTEROSCOPY  0/01   LAPAROSCOPIC BILATERAL SALPINGO OOPHERECTOMY Bilateral 01/07/2014   Procedure: LAPAROSCOPIC BILATERAL SALPINGO OOPHORECTOMY;  Surgeon: Lyman Speller, MD;  Location: [facility];  Service: Gynecology;  Laterality: Bilateral;   PACEMAKER IMPLANT N/A 05/26/2020   Procedure: PACEMAKER IMPLANT;  Surgeon: Vickie Epley, MD;  Location: [place] [facility];  Service: Cardiovascular;  Laterality: N/A;    Current Medications: No outpatient medications have been marked as taking for the 06/16/22 encounter (Appointment) with Vickie Epley, MD.     Allergies:   Clonidine, Hydrocodone-acetaminophen, Latex, Flagyl [metronidazole], Penicillins, Shingrix [zoster vac recomb adjuvanted], and  Sulfamethoxazole-trimethoprim   Social History   Socioeconomic History   Marital status: Widowed    Spouse name: Not on file   Number of children: 3   Years of education: BA   Highest education level: Not on file  Occupational History   Occupation: Retired Product manager: RETIRED  Tobacco Use   Smoking status: Never   Smokeless tobacco: Never  Substance and Sexual Activity   Alcohol use: No    Alcohol/week: 0.0 standard drinks of alcohol   Drug use: No   Sexual activity: Not Currently    Partners: Male  Other Topics Concern   Not on file  Social History Narrative   Widowed.  Lives alone.  Ambulates without assistance.   Drinks about 1 cup of green tea a day    Social Determinants of Radio broadcast assistant Strain: Not on file  Food Insecurity: Not on file  Transportation Needs: Not on file  Physical Activity: Not on file  Stress: Not on file  Social Connections: Not on file     Family History: The patient's family history includes Alzheimer's disease in her father; Heart attack in her mother; Heart disease in her mother; Stroke in her maternal grandmother; Thyroid disease in her maternal grandmother and mother.  ROS:   Please see the history of present illness.    All other systems reviewed and are  negative.  EKGs/Labs/Other Studies Reviewed:    The following studies were reviewed today:  June 16, 2022 in clinic device interrogation personally reviewed ***  EKG:  The ekg ordered today demonstrates ***  Recent Labs: No results found for requested labs within last 365 days.  Recent Lipid Panel    Component Value Date/Time   CHOL 154 01/16/2013 0356   TRIG 197 (H) 01/16/2013 0356   HDL 36 (L) 01/16/2013 0356   CHOLHDL 4.3 01/16/2013 0356   VLDL 39 01/16/2013 0356   LDLCALC 79 01/16/2013 0356    Physical Exam:    VS:  LMP 05/25/1999     Wt Readings from Last 3 Encounters:  04/29/22 140 lb 9.6 oz (63.8 kg)  04/14/21 144 lb 3.2 oz (65.4  kg)  09/16/20 140 lb 3.2 oz (63.6 kg)     GEN: *** Well nourished, well developed in no acute distress CARDIAC: ***RRR, no murmurs, rubs, gallops       ASSESSMENT:    1. Mobitz type II atrioventricular block   2. Cardiac pacemaker in situ   3. Primary hypertension    PLAN:    In order of problems listed above:  #Symptomatic bradycardia #Mobitz 2 heart block #Pacemaker in situ Device functioning appropriately.  Continue remote monitoring.  #Hypertension *** goal today.  Recommend checking blood pressures 1-2 times per week at home and recording the values.  Recommend bringing these recordings to the primary care physician.    Follow-up 1 year with APP   Medication Adjustments/Labs and Tests Ordered: Current medicines are reviewed at length with the patient today.  Concerns regarding medicines are outlined above.  No orders of the defined types were placed in this encounter.  No orders of the defined types were placed in this encounter.    Signed, Lars Mage, MD, [facility], [facility] 06/16/2022 5:52 AM    Electrophysiology [facility]

## 2022-06-16 NOTE — Progress Notes (Signed)
Electrophysiology Office Follow up Visit Note:    Date:  06/16/2022   ID:  Michelle Fuller, DOB 1935/05/03, MRN [id number]  PCP:  Sueanne Margarita, [place] Cardiologist:  Freada Bergeron, MD  [facility] Electrophysiologist:  Vickie Epley, MD    Interval History:    Michelle Fuller is a 88 y.o. female who presents for a follow up visit.  She has a history of pacemaker implant May 26, 2020.  Remote interrogations have shown stable device function since implant.  Today, she states she is feeling well overall. She denies any chest pain or lightheadedness.   Occasionally she has lower energy levels. When she is home and sitting, she forces herself to get up and perform tai chi. However, she feels fine when going out on errands and staying active. No anginal symptoms.  She denies any palpitations, chest pain, shortness of breath, or peripheral edema. No lightheadedness, headaches, syncope, orthopnea, or PND.  She is planning to take a trip to [place] for 2 weeks. Advised her that it would be okay for her to not take her remote interrogation device.       Past Medical History:  Diagnosis Date   Abnormal Pap smear of cervix    Breast cancer ([place])    Right breast, status post XRT   Cataracts, both eyes    Chest pain 2015   recent episode while in sun- recovered in shade with water   Colon polyp    Depression    Diverticulitis    Diverticulosis    Elevated LDL cholesterol level    Essential tremor    Goiter    multinodular   Hyperlipidemia    Hypertension    Osteopenia 8/97   Spine   Ovarian cyst    found on CT scan   Post-menopausal bleeding 01/2000   Huge polyps exc   Syncope    Vitamin D deficiency     Past Surgical History:  Procedure Laterality Date   Bilateral cateract surgery  2014   BREAST LUMPECTOMY Right 4/96   HYSTEROSCOPY  0/01   LAPAROSCOPIC BILATERAL SALPINGO OOPHERECTOMY Bilateral 01/07/2014   Procedure: LAPAROSCOPIC  BILATERAL SALPINGO OOPHORECTOMY;  Surgeon: Lyman Speller, MD;  Location: [facility];  Service: Gynecology;  Laterality: Bilateral;   PACEMAKER IMPLANT N/A 05/26/2020   Procedure: PACEMAKER IMPLANT;  Surgeon: Vickie Epley, MD;  Location: [place] [facility];  Service: Cardiovascular;  Laterality: N/A;    Current Medications: Current Meds  Medication Sig   amLODipine (NORVASC) 5 MG tablet Take 1 tablet (5 mg total) by mouth daily.   aspirin 81 MG tablet Take 1 tablet (81 mg total) by mouth daily.   atorvastatin (LIPITOR) 10 MG tablet Take 1 tablet (10 mg total) by mouth every morning.   Calcium Citrate-Vitamin D (CITRACAL PETITES/VITAMIN D PO) Take 2 tablets by mouth 2 (two) times daily.   carboxymethylcellulose (REFRESH PLUS) 0.5 % SOLN Place 1 drop into both eyes 2 (two) times daily.   ciprofloxacin (CIPRO) 250 MG tablet Take 250 mg by mouth 2 (two) times daily between meals as needed (UTI).   clonazePAM (KLONOPIN) 0.5 MG tablet Take 0.25 mg by mouth at bedtime.   Flaxseed, Linseed, (SM FLAX SEED OIL) 1000 MG CAPS Take 1,000 mg by mouth every morning.    ibandronate (BONIVA) 150 MG tablet Take 150 mg by mouth every 30 (thirty) days.   indapamide (LOZOL) 1.25 MG tablet Take 1.25 mg by mouth every morning.  Lactobacillus Rhamnosus, GG, (CULTURELLE PO) Take 1 capsule by mouth every morning.    Multiple Vitamin (MULTIVITAMIN WITH MINERALS) TABS tablet Take 1 tablet by mouth every morning.   omeprazole (PRILOSEC) 20 MG capsule Take 20 mg by mouth See admin instructions. Take 20 mg every day for two every four months   polyethylene glycol powder (GLYCOLAX/MIRALAX) powder Take 17 g by mouth daily as needed for moderate constipation or severe constipation.   primidone (MYSOLINE) 50 MG tablet Take 3 tablets (150 mg total) by mouth daily with breakfast.   propranolol (INDERAL) 40 MG tablet Take 1 tablet (40 mg total) by mouth 2 (two) times daily.   valsartan (DIOVAN) 320 MG tablet Take 1 tablet  (320 mg total) by mouth every morning.     Allergies:   Clonidine, Hydrocodone-acetaminophen, Latex, Flagyl [metronidazole], Penicillins, Shingrix [zoster vac recomb adjuvanted], and Sulfamethoxazole-trimethoprim   Social History   Socioeconomic History   Marital status: Widowed    Spouse name: Not on file   Number of children: 3   Years of education: BA   Highest education level: Not on file  Occupational History   Occupation: Retired Product manager: RETIRED  Tobacco Use   Smoking status: Never   Smokeless tobacco: Never  Substance and Sexual Activity   Alcohol use: No    Alcohol/week: 0.0 standard drinks of alcohol   Drug use: No   Sexual activity: Not Currently    Partners: Male  Other Topics Concern   Not on file  Social History Narrative   Widowed.  Lives alone.  Ambulates without assistance.   Drinks about 1 cup of green tea a day    Social Determinants of Radio broadcast assistant Strain: Not on file  Food Insecurity: Not on file  Transportation Needs: Not on file  Physical Activity: Not on file  Stress: Not on file  Social Connections: Not on file     Family History: The patient's family history includes Alzheimer's disease in her father; Heart attack in her mother; Heart disease in her mother; Stroke in her maternal grandmother; Thyroid disease in her maternal grandmother and mother.  ROS:   Please see the history of present illness.    All other systems reviewed and are negative.  EKGs/Labs/Other Studies Reviewed:    The following studies were reviewed today:  June 16, 2022 in clinic device interrogation personally reviewed Battery longevity 8.1 years Lead parameter stable Ventricular pacing 99% Atrial pacing 3.4% Less than 1% mode switch, longest 8 seconds  EKG:  The ekg ordered today demonstrates a sensed, V paced with frequent PVCs.  Paced QRS 140 ms  Recent Labs: No results found for requested labs within last 365 days.   Recent  Lipid Panel    Component Value Date/Time   CHOL 154 01/16/2013 0356   TRIG 197 (H) 01/16/2013 0356   HDL 36 (L) 01/16/2013 0356   CHOLHDL 4.3 01/16/2013 0356   VLDL 39 01/16/2013 0356   LDLCALC 79 01/16/2013 0356    Physical Exam:    VS:  BP 126/86   Pulse 85   Ht '5\' 4"'$  (1.626 m)   Wt 144 lb (65.3 kg)   LMP 05/25/1999   SpO2 96%   BMI 24.72 kg/m     Wt Readings from Last 3 Encounters:  06/16/22 144 lb (65.3 kg)  04/29/22 140 lb 9.6 oz (63.8 kg)  04/14/21 144 lb 3.2 oz (65.4 kg)     GEN:  Well nourished,  well developed in no acute distress CARDIAC: RRR, no murmurs, rubs, gallops. Pacer pocket well healed.       ASSESSMENT:    1. Mobitz type II atrioventricular block   2. Cardiac pacemaker in situ   3. Primary hypertension    PLAN:    In order of problems listed above:  #Symptomatic bradycardia #Mobitz 2 heart block #Pacemaker in situ Device functioning appropriately.  Continue remote monitoring.  #Hypertension At goal today.  Recommend checking blood pressures 1-2 times per week at home and recording the values.  Recommend bringing these recordings to the primary care physician.   Follow-up 1 year with APP   Medication Adjustments/Labs and Tests Ordered: Current medicines are reviewed at length with the patient today.  Concerns regarding medicines are outlined above.   No orders of the defined types were placed in this encounter.  No orders of the defined types were placed in this encounter.  I,Mathew Stumpf,acting as a Education administrator for Vickie Epley, MD.,have documented all relevant documentation on the behalf of Vickie Epley, MD,as directed by  Vickie Epley, MD while in the presence of Vickie Epley, MD.  I, Vickie Epley, MD, have reviewed all documentation for this visit. The documentation on 06/16/22 for the exam, diagnosis, procedures, and orders are all accurate and complete.   Signed, Lars Mage, MD, [facility],  [facility] 06/16/2022 4:27 PM    Electrophysiology [facility]

## 2022-06-22 ENCOUNTER — Encounter: Payer: Self-pay | Admitting: Cardiology

## 2022-07-19 DIAGNOSIS — H04123 Dry eye syndrome of bilateral lacrimal glands: Secondary | ICD-10-CM | POA: Diagnosis not present

## 2022-08-27 ENCOUNTER — Ambulatory Visit (INDEPENDENT_AMBULATORY_CARE_PROVIDER_SITE_OTHER): Payer: Medicare PPO

## 2022-08-27 DIAGNOSIS — I441 Atrioventricular block, second degree: Secondary | ICD-10-CM | POA: Diagnosis not present

## 2022-08-27 LAB — CUP PACEART REMOTE DEVICE CHECK
Battery Remaining Longevity: 96 mo
Battery Remaining Percentage: 79 %
Battery Voltage: 3.01 V
Brady Statistic AP VP Percent: 5.9 %
Brady Statistic AP VS Percent: 1 %
Brady Statistic AS VP Percent: 88 %
Brady Statistic AS VS Percent: 4.3 %
Brady Statistic RA Percent Paced: 4 %
Brady Statistic RV Percent Paced: 94 %
Date Time Interrogation Session: 20240405020016
Implantable Lead Connection Status: 753985
Implantable Lead Connection Status: 753985
Implantable Lead Implant Date: 20220103
Implantable Lead Implant Date: 20220103
Implantable Lead Location: 753859
Implantable Lead Location: 753860
Implantable Pulse Generator Implant Date: 20220103
Lead Channel Impedance Value: 550 Ohm
Lead Channel Impedance Value: 580 Ohm
Lead Channel Pacing Threshold Amplitude: 0.75 V
Lead Channel Pacing Threshold Amplitude: 0.875 V
Lead Channel Pacing Threshold Pulse Width: 0.5 ms
Lead Channel Pacing Threshold Pulse Width: 0.5 ms
Lead Channel Sensing Intrinsic Amplitude: 12 mV
Lead Channel Sensing Intrinsic Amplitude: 2.8 mV
Lead Channel Setting Pacing Amplitude: 1.125
Lead Channel Setting Pacing Amplitude: 2 V
Lead Channel Setting Pacing Pulse Width: 0.5 ms
Lead Channel Setting Sensing Sensitivity: 2 mV
Pulse Gen Model: 2272
Pulse Gen Serial Number: 3885899

## 2022-09-28 NOTE — Progress Notes (Signed)
Remote pacemaker transmission.   

## 2022-10-05 DIAGNOSIS — E042 Nontoxic multinodular goiter: Secondary | ICD-10-CM | POA: Diagnosis not present

## 2022-10-05 DIAGNOSIS — M810 Age-related osteoporosis without current pathological fracture: Secondary | ICD-10-CM | POA: Diagnosis not present

## 2022-10-05 DIAGNOSIS — I10 Essential (primary) hypertension: Secondary | ICD-10-CM | POA: Diagnosis not present

## 2022-10-05 DIAGNOSIS — E785 Hyperlipidemia, unspecified: Secondary | ICD-10-CM | POA: Diagnosis not present

## 2022-10-12 DIAGNOSIS — D692 Other nonthrombocytopenic purpura: Secondary | ICD-10-CM | POA: Diagnosis not present

## 2022-10-12 DIAGNOSIS — Z0000 Encounter for general adult medical examination without abnormal findings: Secondary | ICD-10-CM | POA: Diagnosis not present

## 2022-10-12 DIAGNOSIS — I495 Sick sinus syndrome: Secondary | ICD-10-CM | POA: Diagnosis not present

## 2022-10-12 DIAGNOSIS — Z1339 Encounter for screening examination for other mental health and behavioral disorders: Secondary | ICD-10-CM | POA: Diagnosis not present

## 2022-10-12 DIAGNOSIS — Z1331 Encounter for screening for depression: Secondary | ICD-10-CM | POA: Diagnosis not present

## 2022-10-12 DIAGNOSIS — R82998 Other abnormal findings in urine: Secondary | ICD-10-CM | POA: Diagnosis not present

## 2022-10-12 DIAGNOSIS — M810 Age-related osteoporosis without current pathological fracture: Secondary | ICD-10-CM | POA: Diagnosis not present

## 2022-10-12 DIAGNOSIS — E785 Hyperlipidemia, unspecified: Secondary | ICD-10-CM | POA: Diagnosis not present

## 2022-10-12 DIAGNOSIS — I10 Essential (primary) hypertension: Secondary | ICD-10-CM | POA: Diagnosis not present

## 2022-11-15 DIAGNOSIS — R55 Syncope and collapse: Secondary | ICD-10-CM | POA: Diagnosis not present

## 2022-11-15 DIAGNOSIS — I10 Essential (primary) hypertension: Secondary | ICD-10-CM | POA: Diagnosis not present

## 2022-11-15 DIAGNOSIS — R42 Dizziness and giddiness: Secondary | ICD-10-CM | POA: Diagnosis not present

## 2022-11-15 DIAGNOSIS — Z880 Allergy status to penicillin: Secondary | ICD-10-CM | POA: Diagnosis not present

## 2022-11-15 DIAGNOSIS — E876 Hypokalemia: Secondary | ICD-10-CM | POA: Diagnosis not present

## 2022-11-26 ENCOUNTER — Ambulatory Visit (INDEPENDENT_AMBULATORY_CARE_PROVIDER_SITE_OTHER): Payer: Medicare PPO

## 2022-11-26 DIAGNOSIS — I441 Atrioventricular block, second degree: Secondary | ICD-10-CM | POA: Diagnosis not present

## 2022-11-29 LAB — CUP PACEART REMOTE DEVICE CHECK
Battery Remaining Longevity: 91 mo
Battery Remaining Percentage: 76 %
Battery Voltage: 3.01 V
Brady Statistic AP VP Percent: 3.6 %
Brady Statistic AP VS Percent: 1 %
Brady Statistic AS VP Percent: 93 %
Brady Statistic AS VS Percent: 2 %
Brady Statistic RA Percent Paced: 2.7 %
Brady Statistic RV Percent Paced: 97 %
Date Time Interrogation Session: 20240707000021
Implantable Lead Connection Status: 753985
Implantable Lead Connection Status: 753985
Implantable Lead Implant Date: 20220103
Implantable Lead Implant Date: 20220103
Implantable Lead Location: 753859
Implantable Lead Location: 753860
Implantable Pulse Generator Implant Date: 20220103
Lead Channel Impedance Value: 510 Ohm
Lead Channel Impedance Value: 550 Ohm
Lead Channel Pacing Threshold Amplitude: 0.75 V
Lead Channel Pacing Threshold Amplitude: 0.75 V
Lead Channel Pacing Threshold Pulse Width: 0.5 ms
Lead Channel Pacing Threshold Pulse Width: 0.5 ms
Lead Channel Sensing Intrinsic Amplitude: 12 mV
Lead Channel Sensing Intrinsic Amplitude: 2 mV
Lead Channel Setting Pacing Amplitude: 1 V
Lead Channel Setting Pacing Amplitude: 2 V
Lead Channel Setting Pacing Pulse Width: 0.5 ms
Lead Channel Setting Sensing Sensitivity: 2 mV
Pulse Gen Model: 2272
Pulse Gen Serial Number: 3885899

## 2022-12-01 ENCOUNTER — Ambulatory Visit (HOSPITAL_COMMUNITY)
Admission: EM | Admit: 2022-12-01 | Discharge: 2022-12-01 | Disposition: A | Discharge status: Home / Self Care | Payer: Medicare PPO | Attending: Emergency Medicine | Admitting: Emergency Medicine

## 2022-12-01 ENCOUNTER — Encounter (HOSPITAL_COMMUNITY): Payer: Self-pay | Admitting: Emergency Medicine

## 2022-12-01 DIAGNOSIS — Z203 Contact with and (suspected) exposure to rabies: Secondary | ICD-10-CM

## 2022-12-01 DIAGNOSIS — Z209 Contact with and (suspected) exposure to unspecified communicable disease: Secondary | ICD-10-CM

## 2022-12-01 MED ORDER — RABIES IMMUNE GLOBULIN 150 UNIT/ML IM INJ
20.0000 [IU]/kg | INJECTION | Freq: Once | INTRAMUSCULAR | Status: AC
  Administered 2022-12-01: 1275 [IU]

## 2022-12-01 MED ORDER — RABIES VACCINE, PCEC IM SUSR
INTRAMUSCULAR | Status: AC
  Filled 2022-12-01: qty 1

## 2022-12-01 MED ORDER — TETANUS-DIPHTH-ACELL PERTUSSIS 5-2.5-18.5 LF-MCG/0.5 IM SUSY
0.5000 mL | PREFILLED_SYRINGE | Freq: Once | INTRAMUSCULAR | Status: AC
  Administered 2022-12-01: 0.5 mL via INTRAMUSCULAR

## 2022-12-01 MED ORDER — TETANUS-DIPHTH-ACELL PERTUSSIS 5-2.5-18.5 LF-MCG/0.5 IM SUSY
PREFILLED_SYRINGE | INTRAMUSCULAR | Status: AC
  Filled 2022-12-01: qty 0.5

## 2022-12-01 MED ORDER — RABIES IMMUNE GLOBULIN 150 UNIT/ML IM INJ
INJECTION | INTRAMUSCULAR | Status: AC
  Filled 2022-12-01: qty 10

## 2022-12-01 MED ORDER — RABIES VACCINE, PCEC IM SUSR
1.0000 mL | Freq: Once | INTRAMUSCULAR | Status: AC
  Administered 2022-12-01: 1 mL via INTRAMUSCULAR

## 2022-12-01 NOTE — ED Triage Notes (Signed)
Pt reports there was a bat on the floor of home last night. Got something out of the trash to pick it up and take out side. When took outside to let go, the bat flew and was in hair on posterior head. Reports took back outside.

## 2022-12-01 NOTE — ED Provider Notes (Signed)
[facility]    CSN: [id number] Arrival date & time: 12/01/22  1037      History   Chief Complaint No chief complaint on file.   HPI Michelle Fuller is a 88 y.o. female.   Patient presents to clinic to receive the rabies vaccine series after exposure to bat within the home 1 day ago.  Endorses back was on 4 of her home, picked up the bat and took outside where it began to fly landing on the back of her head ,denies being scratched or bitten.  Plans to have checked to ensure that there are no further bats present. Last tetanus 2015.   Past Medical History:  Diagnosis Date   Abnormal Pap smear of cervix    Breast cancer ([facility])    Right breast, status post XRT   Cataracts, both eyes    Chest pain 2015   recent episode while in sun- recovered in shade with water   Colon polyp    Depression    Diverticulitis    Diverticulosis    Elevated LDL cholesterol level    Essential tremor    Goiter    multinodular   Hyperlipidemia    Hypertension    Osteopenia 8/97   Spine   Ovarian cyst    found on CT scan   Post-menopausal bleeding 01/2000   Huge polyps exc   Syncope    Vitamin D deficiency     Patient Active Problem List   Diagnosis Date Noted   Pacemaker 09/16/2020   Symptomatic bradycardia 05/26/2020   Sick sinus syndrome (HCC) 04/04/2020   Renal lesion 03/27/2017   Nausea vomiting and diarrhea 03/27/2017   Abdominal pain 03/27/2017   Mixed dyslipidemia 08/10/2016   Multinodular goiter (nontoxic) 08/10/2016   Vitamin D insufficiency 08/10/2016   Mobitz type II atrioventricular block 04/21/2015   Pain in the chest    Chest pain 04/20/2015   GERD (gastroesophageal reflux disease) 04/20/2015   HX: breast cancer 09/10/2013   Diverticulitis 03/31/2013   Vasovagal near syncope 03/31/2013   Visual disturbance, transient 01/16/2013   Hypertension 01/16/2013   Hyperlipidemia 01/16/2013   Cerebrovascular disease 01/16/2013   Essential tremor 01/16/2013     Past Surgical History:  Procedure Laterality Date   Bilateral cateract surgery  2014   BREAST LUMPECTOMY Right 4/96   HYSTEROSCOPY  0/01   LAPAROSCOPIC BILATERAL SALPINGO OOPHERECTOMY Bilateral 01/07/2014   Procedure: LAPAROSCOPIC BILATERAL SALPINGO OOPHORECTOMY;  Surgeon: Annamaria Boots, MD;  Location: [facility];  Service: Gynecology;  Laterality: Bilateral;   PACEMAKER IMPLANT N/A 05/26/2020   Procedure: PACEMAKER IMPLANT;  Surgeon: Lanier Prude, MD;  Location: [facility];  Service: Cardiovascular;  Laterality: N/A;    OB History     Gravida  3   Para  3   Term      Preterm      AB      Living  3      SAB      IAB      Ectopic      Multiple      Live Births               Home Medications    Prior to Admission medications   Medication Sig Start Date End Date Taking? Authorizing Provider  amLODipine (NORVASC) 5 MG tablet Take 1 tablet (5 mg total) by mouth daily. 04/29/22   Meriam Sprague, MD  aspirin 81 MG tablet Take 1 tablet (  81 mg total) by mouth daily. 01/16/13   Marinda Elk, MD  atorvastatin (LIPITOR) 10 MG tablet Take 1 tablet (10 mg total) by mouth every morning. 04/29/22   Meriam Sprague, MD  Calcium Citrate-Vitamin D (CITRACAL PETITES/VITAMIN D PO) Take 2 tablets by mouth 2 (two) times daily.    [provider]  carboxymethylcellulose (REFRESH PLUS) 0.5 % SOLN Place 1 drop into both eyes 2 (two) times daily.    [provider]  ciprofloxacin (CIPRO) 250 MG tablet Take 250 mg by mouth 2 (two) times daily between meals as needed (UTI). 02/25/17   [provider]  clonazePAM (KLONOPIN) 0.5 MG tablet Take 0.25 mg by mouth at bedtime. 09/09/14   [provider]  Flaxseed, Linseed, (SM FLAX SEED OIL) 1000 MG CAPS Take 1,000 mg by mouth every morning.     [provider]  ibandronate (BONIVA) 150 MG tablet Take 150 mg by mouth every 30 (thirty) days.    [provider]   indapamide (LOZOL) 1.25 MG tablet Take 1.25 mg by mouth every morning.     [provider]  Lactobacillus Rhamnosus, GG, (CULTURELLE PO) Take 1 capsule by mouth every morning.     [provider]  Multiple Vitamin (MULTIVITAMIN WITH MINERALS) TABS tablet Take 1 tablet by mouth every morning.    [provider]  omeprazole (PRILOSEC) 20 MG capsule Take 20 mg by mouth See admin instructions. Take 20 mg every day for two every four months    [provider]  polyethylene glycol powder (GLYCOLAX/MIRALAX) powder Take 17 g by mouth daily as needed for moderate constipation or severe constipation.    [provider]  primidone (MYSOLINE) 50 MG tablet Take 3 tablets (150 mg total) by mouth daily with breakfast. 01/08/14   Jerene Bears, MD  propranolol (INDERAL) 40 MG tablet Take 1 tablet (40 mg total) by mouth 2 (two) times daily. 04/29/22   Meriam Sprague, MD  valsartan (DIOVAN) 320 MG tablet Take 1 tablet (320 mg total) by mouth every morning. 04/29/22   Meriam Sprague, MD    Family History Family History  Problem Relation Age of Onset   Heart disease Mother        Died at 67 of MI - says doctors thought it was due to mistreatment of thyroid dz in youth   Thyroid disease Mother    Heart attack Mother    Alzheimer's disease Father    Thyroid disease Maternal Grandmother    Stroke Maternal Grandmother     Social History Social History   Tobacco Use   Smoking status: Never   Smokeless tobacco: Never  Substance Use Topics   Alcohol use: No    Alcohol/week: 0.0 standard drinks of alcohol   Drug use: No     Allergies   Clonidine, Hydrocodone-acetaminophen, Latex, Flagyl [metronidazole], Penicillins, Shingrix [zoster vac recomb adjuvanted], and Sulfamethoxazole-trimethoprim   Review of Systems Review of Systems   Physical Exam Triage Vital Signs ED Triage Vitals  Enc Vitals Group     BP 12/01/22 1052 (!) 153/95     Pulse  Rate 12/01/22 1052 92     Resp 12/01/22 1052 18     Temp 12/01/22 1052 97.8 F (36.6 C)     Temp Source 12/01/22 1052 Oral     SpO2 12/01/22 1052 93 %     Weight 12/01/22 1054 138 lb 9.6 oz (62.9 kg)     Height --  Head Circumference --      Peak Flow --      Pain Score 12/01/22 1051 0     Pain Loc --      Pain Edu? --      Excl. in GC? --    No data found.  Updated Vital Signs BP (!) 153/95 (BP Location: Left Arm)   Pulse 92   Temp 97.8 F (36.6 C) (Oral)   Resp 18   Wt 138 lb 9.6 oz (62.9 kg)   LMP 05/25/1999   SpO2 93%   BMI 23.79 kg/m   Visual Acuity Right Eye Distance:   Left Eye Distance:   Bilateral Distance:    Right Eye Near:   Left Eye Near:    Bilateral Near:     Physical Exam Constitutional:      Appearance: Normal appearance.  HENT:     Head:     Comments: No abnormalities to the scalp Eyes:     Extraocular Movements: Extraocular movements intact.  Pulmonary:     Effort: Pulmonary effort is normal.  Neurological:     Mental Status: She is alert and oriented to person, place, and time.      UC Treatments / Results  Labs (all labs ordered are listed, but only abnormal results are displayed) Labs Reviewed - No data to display  EKG   Radiology No results found.  Procedures Procedures (including critical care time)  Medications Ordered in UC Medications  rabies immune globulin (HYPERRAB/KEDRAB) injection 1,275 Units (has no administration in time range)  rabies vaccine (RABAVERT) injection 1 mL (has no administration in time range)  Tdap (BOOSTRIX) injection 0.5 mL (has no administration in time range)    Initial Impression / Assessment and Plan / UC Course  I have reviewed the triage vital signs and the nursing notes.  Pertinent labs & imaging results that were available during my care of the patient were reviewed by me and considered in my medical decision making (see chart for details).  Exposure to bat without known  bite  No abnormalities to the scalp, no wound present, rabies vaccine series initiated, tetanus shot updated, given written handout on schedule for vaccine Final Clinical Impressions(s) / UC Diagnoses   Final diagnoses:  Exposure to bat without known bite     Discharge Instructions      Due to your bat exposure you will be started on the rabies vaccine series and you have been given the first dosages today as well as a updated tetanus  Tetanus shot is good for the next 10 years  Below is your rabies vaccine schedule, please return on scheduled date only, may go to any of our clinics to receive vaccination  Day 3 Saturday, December 04, 2022 Day 7 Wednesday, December 08, 2022 Day 14 Wednesday, December 15, 2022    ED Prescriptions   None    PDMP not reviewed this encounter.   Valinda Hoar, NP 12/01/22 1136

## 2022-12-01 NOTE — Discharge Instructions (Signed)
Due to your bat exposure you will be started on the rabies vaccine series and you have been given the first dosages today as well as a updated tetanus  Tetanus shot is good for the next 10 years  Below is your rabies vaccine schedule, please return on scheduled date only, may go to any of our clinics to receive vaccination  Day 3 Saturday, December 04, 2022 Day 7 Wednesday, December 08, 2022 Day 14 Wednesday, December 15, 2022

## 2022-12-04 ENCOUNTER — Encounter (HOSPITAL_COMMUNITY): Payer: Self-pay

## 2022-12-04 ENCOUNTER — Ambulatory Visit (HOSPITAL_COMMUNITY): Payer: Medicare PPO

## 2022-12-04 ENCOUNTER — Ambulatory Visit (HOSPITAL_COMMUNITY)
Admission: RE | Admit: 2022-12-04 | Discharge: 2022-12-04 | Disposition: A | Discharge status: Home / Self Care | Payer: Medicare PPO | Source: Ambulatory Visit | Attending: Emergency Medicine | Admitting: Emergency Medicine

## 2022-12-04 DIAGNOSIS — Z203 Contact with and (suspected) exposure to rabies: Secondary | ICD-10-CM | POA: Diagnosis not present

## 2022-12-04 MED ORDER — RABIES VACCINE, PCEC IM SUSR
1.0000 mL | Freq: Once | INTRAMUSCULAR | Status: AC
  Administered 2022-12-04: 1 mL via INTRAMUSCULAR

## 2022-12-04 MED ORDER — RABIES VACCINE, PCEC IM SUSR
INTRAMUSCULAR | Status: AC
  Filled 2022-12-04: qty 1

## 2022-12-04 NOTE — ED Triage Notes (Signed)
Patient states she is here for her second rabies injection.

## 2022-12-08 ENCOUNTER — Ambulatory Visit (HOSPITAL_COMMUNITY): Payer: Medicare PPO

## 2022-12-08 ENCOUNTER — Ambulatory Visit (HOSPITAL_COMMUNITY)
Admission: EM | Admit: 2022-12-08 | Discharge: 2022-12-08 | Disposition: A | Discharge status: Home / Self Care | Payer: Medicare PPO | Attending: Family Medicine | Admitting: Family Medicine

## 2022-12-08 DIAGNOSIS — Z203 Contact with and (suspected) exposure to rabies: Secondary | ICD-10-CM | POA: Diagnosis not present

## 2022-12-08 MED ORDER — RABIES VACCINE, PCEC IM SUSR
1.0000 mL | Freq: Once | INTRAMUSCULAR | Status: AC
  Administered 2022-12-08: 1 mL via INTRAMUSCULAR

## 2022-12-08 MED ORDER — RABIES VACCINE, PCEC IM SUSR
INTRAMUSCULAR | Status: AC
  Filled 2022-12-08: qty 1

## 2022-12-08 NOTE — ED Triage Notes (Signed)
Pt is here to receive 3rd rabies shot. Gave shot in right arm.

## 2022-12-13 NOTE — Progress Notes (Signed)
Remote pacemaker transmission.   

## 2022-12-15 ENCOUNTER — Ambulatory Visit (HOSPITAL_COMMUNITY)
Admission: EM | Admit: 2022-12-15 | Discharge: 2022-12-15 | Disposition: A | Discharge status: Home / Self Care | Payer: Medicare PPO | Attending: Internal Medicine | Admitting: Internal Medicine

## 2022-12-15 ENCOUNTER — Ambulatory Visit (HOSPITAL_COMMUNITY): Payer: Medicare PPO

## 2022-12-15 ENCOUNTER — Encounter (HOSPITAL_COMMUNITY): Payer: Self-pay

## 2022-12-15 DIAGNOSIS — Z203 Contact with and (suspected) exposure to rabies: Secondary | ICD-10-CM | POA: Diagnosis not present

## 2022-12-15 MED ORDER — RABIES VACCINE, PCEC IM SUSR
INTRAMUSCULAR | Status: AC
  Filled 2022-12-15: qty 1

## 2022-12-15 MED ORDER — RABIES VACCINE, PCEC IM SUSR
1.0000 mL | Freq: Once | INTRAMUSCULAR | Status: AC
  Administered 2022-12-15: 1 mL via INTRAMUSCULAR

## 2022-12-15 NOTE — ED Triage Notes (Signed)
Pt reports she is here for her 4th and final rabies vaccination.

## 2022-12-24 DIAGNOSIS — Z1231 Encounter for screening mammogram for malignant neoplasm of breast: Secondary | ICD-10-CM | POA: Diagnosis not present

## 2023-01-05 DIAGNOSIS — R062 Wheezing: Secondary | ICD-10-CM | POA: Diagnosis not present

## 2023-01-05 DIAGNOSIS — R5381 Other malaise: Secondary | ICD-10-CM | POA: Diagnosis not present

## 2023-01-05 DIAGNOSIS — E871 Hypo-osmolality and hyponatremia: Secondary | ICD-10-CM | POA: Diagnosis not present

## 2023-01-05 DIAGNOSIS — I495 Sick sinus syndrome: Secondary | ICD-10-CM | POA: Diagnosis not present

## 2023-01-05 DIAGNOSIS — G4700 Insomnia, unspecified: Secondary | ICD-10-CM | POA: Diagnosis not present

## 2023-01-05 DIAGNOSIS — R5383 Other fatigue: Secondary | ICD-10-CM | POA: Diagnosis not present

## 2023-01-06 ENCOUNTER — Emergency Department (HOSPITAL_COMMUNITY): Payer: Medicare PPO

## 2023-01-06 ENCOUNTER — Inpatient Hospital Stay (HOSPITAL_COMMUNITY): Payer: Medicare PPO

## 2023-01-06 ENCOUNTER — Inpatient Hospital Stay (HOSPITAL_COMMUNITY)
Admission: EM | Admit: 2023-01-06 | Discharge: 2023-01-07 | DRG: 086 | Disposition: A | Discharge status: Home Health | Payer: Medicare PPO | Attending: Internal Medicine | Admitting: Internal Medicine

## 2023-01-06 DIAGNOSIS — E871 Hypo-osmolality and hyponatremia: Secondary | ICD-10-CM | POA: Diagnosis not present

## 2023-01-06 DIAGNOSIS — M8580 Other specified disorders of bone density and structure, unspecified site: Secondary | ICD-10-CM | POA: Diagnosis present

## 2023-01-06 DIAGNOSIS — I10 Essential (primary) hypertension: Secondary | ICD-10-CM | POA: Diagnosis not present

## 2023-01-06 DIAGNOSIS — W010XXA Fall on same level from slipping, tripping and stumbling without subsequent striking against object, initial encounter: Secondary | ICD-10-CM | POA: Diagnosis present

## 2023-01-06 DIAGNOSIS — R402252 Coma scale, best verbal response, oriented, at arrival to emergency department: Secondary | ICD-10-CM | POA: Diagnosis present

## 2023-01-06 DIAGNOSIS — Z883 Allergy status to other anti-infective agents status: Secondary | ICD-10-CM

## 2023-01-06 DIAGNOSIS — R402362 Coma scale, best motor response, obeys commands, at arrival to emergency department: Secondary | ICD-10-CM | POA: Diagnosis present

## 2023-01-06 DIAGNOSIS — S0990XA Unspecified injury of head, initial encounter: Secondary | ICD-10-CM | POA: Diagnosis not present

## 2023-01-06 DIAGNOSIS — W182XXA Fall in (into) shower or empty bathtub, initial encounter: Secondary | ICD-10-CM | POA: Diagnosis present

## 2023-01-06 DIAGNOSIS — I6201 Nontraumatic acute subdural hemorrhage: Secondary | ICD-10-CM | POA: Diagnosis not present

## 2023-01-06 DIAGNOSIS — Z823 Family history of stroke: Secondary | ICD-10-CM | POA: Diagnosis not present

## 2023-01-06 DIAGNOSIS — Z853 Personal history of malignant neoplasm of breast: Secondary | ICD-10-CM

## 2023-01-06 DIAGNOSIS — I6782 Cerebral ischemia: Secondary | ICD-10-CM | POA: Diagnosis not present

## 2023-01-06 DIAGNOSIS — S065X0A Traumatic subdural hemorrhage without loss of consciousness, initial encounter: Secondary | ICD-10-CM | POA: Diagnosis not present

## 2023-01-06 DIAGNOSIS — Y92012 Bathroom of single-family (private) house as the place of occurrence of the external cause: Secondary | ICD-10-CM | POA: Diagnosis not present

## 2023-01-06 DIAGNOSIS — M25512 Pain in left shoulder: Secondary | ICD-10-CM | POA: Diagnosis not present

## 2023-01-06 DIAGNOSIS — Z882 Allergy status to sulfonamides status: Secondary | ICD-10-CM

## 2023-01-06 DIAGNOSIS — Z880 Allergy status to penicillin: Secondary | ICD-10-CM | POA: Diagnosis not present

## 2023-01-06 DIAGNOSIS — Y92014 Private driveway to single-family (private) house as the place of occurrence of the external cause: Secondary | ICD-10-CM

## 2023-01-06 DIAGNOSIS — G250 Essential tremor: Secondary | ICD-10-CM | POA: Diagnosis present

## 2023-01-06 DIAGNOSIS — W19XXXA Unspecified fall, initial encounter: Secondary | ICD-10-CM

## 2023-01-06 DIAGNOSIS — S065XAA Traumatic subdural hemorrhage with loss of consciousness status unknown, initial encounter: Principal | ICD-10-CM | POA: Diagnosis present

## 2023-01-06 DIAGNOSIS — R402142 Coma scale, eyes open, spontaneous, at arrival to emergency department: Secondary | ICD-10-CM | POA: Diagnosis present

## 2023-01-06 DIAGNOSIS — E559 Vitamin D deficiency, unspecified: Secondary | ICD-10-CM | POA: Diagnosis present

## 2023-01-06 DIAGNOSIS — E785 Hyperlipidemia, unspecified: Secondary | ICD-10-CM | POA: Diagnosis not present

## 2023-01-06 DIAGNOSIS — Z7982 Long term (current) use of aspirin: Secondary | ICD-10-CM | POA: Diagnosis not present

## 2023-01-06 DIAGNOSIS — Z820 Family history of epilepsy and other diseases of the nervous system: Secondary | ICD-10-CM

## 2023-01-06 DIAGNOSIS — Z923 Personal history of irradiation: Secondary | ICD-10-CM | POA: Diagnosis not present

## 2023-01-06 DIAGNOSIS — R102 Pelvic and perineal pain: Secondary | ICD-10-CM | POA: Diagnosis not present

## 2023-01-06 DIAGNOSIS — M129 Arthropathy, unspecified: Secondary | ICD-10-CM | POA: Diagnosis not present

## 2023-01-06 DIAGNOSIS — Z91040 Latex allergy status: Secondary | ICD-10-CM

## 2023-01-06 DIAGNOSIS — Z887 Allergy status to serum and vaccine status: Secondary | ICD-10-CM

## 2023-01-06 DIAGNOSIS — Z8673 Personal history of transient ischemic attack (TIA), and cerebral infarction without residual deficits: Secondary | ICD-10-CM

## 2023-01-06 DIAGNOSIS — Z8249 Family history of ischemic heart disease and other diseases of the circulatory system: Secondary | ICD-10-CM

## 2023-01-06 DIAGNOSIS — I495 Sick sinus syndrome: Secondary | ICD-10-CM | POA: Diagnosis not present

## 2023-01-06 DIAGNOSIS — Z950 Presence of cardiac pacemaker: Secondary | ICD-10-CM | POA: Diagnosis not present

## 2023-01-06 DIAGNOSIS — Z79899 Other long term (current) drug therapy: Secondary | ICD-10-CM | POA: Diagnosis not present

## 2023-01-06 DIAGNOSIS — Z8349 Family history of other endocrine, nutritional and metabolic diseases: Secondary | ICD-10-CM

## 2023-01-06 DIAGNOSIS — Z7983 Long term (current) use of bisphosphonates: Secondary | ICD-10-CM

## 2023-01-06 DIAGNOSIS — Z885 Allergy status to narcotic agent status: Secondary | ICD-10-CM

## 2023-01-06 DIAGNOSIS — S4982XA Other specified injuries of left shoulder and upper arm, initial encounter: Secondary | ICD-10-CM | POA: Diagnosis not present

## 2023-01-06 DIAGNOSIS — I6200 Nontraumatic subdural hemorrhage, unspecified: Secondary | ICD-10-CM | POA: Diagnosis not present

## 2023-01-06 DIAGNOSIS — Z8719 Personal history of other diseases of the digestive system: Secondary | ICD-10-CM

## 2023-01-06 DIAGNOSIS — K219 Gastro-esophageal reflux disease without esophagitis: Secondary | ICD-10-CM | POA: Diagnosis present

## 2023-01-06 LAB — CBC
HCT: 46 % (ref 36.0–46.0)
Hemoglobin: 15.5 g/dL — ABNORMAL HIGH (ref 12.0–15.0)
MCH: 29.4 pg (ref 26.0–34.0)
MCHC: 33.7 g/dL (ref 30.0–36.0)
MCV: 87.3 fL (ref 80.0–100.0)
Platelets: 289 10*3/uL (ref 150–400)
RBC: 5.27 MIL/uL — ABNORMAL HIGH (ref 3.87–5.11)
RDW: 12.6 % (ref 11.5–15.5)
WBC: 9.1 10*3/uL (ref 4.0–10.5)
nRBC: 0 % (ref 0.0–0.2)

## 2023-01-06 LAB — BASIC METABOLIC PANEL
Anion gap: 11 (ref 5–15)
BUN: 17 mg/dL (ref 8–23)
CO2: 25 mmol/L (ref 22–32)
Calcium: 9.3 mg/dL (ref 8.9–10.3)
Chloride: 93 mmol/L — ABNORMAL LOW (ref 98–111)
Creatinine, Ser: 0.75 mg/dL (ref 0.44–1.00)
GFR, Estimated: >60 mL/min (ref >60)
Glucose, Bld: 108 mg/dL — ABNORMAL HIGH (ref 70–99)
Potassium: 3.6 mmol/L (ref 3.5–5.1)
Sodium: 129 mmol/L — ABNORMAL LOW (ref 135–145)

## 2023-01-06 LAB — OSMOLALITY: Osmolality: 284 mOsm/kg (ref 275–295)

## 2023-01-06 MED ORDER — HYDROCODONE-ACETAMINOPHEN 5-325 MG PO TABS
1.0000 | ORAL_TABLET | Freq: Once | ORAL | Status: AC
  Administered 2023-01-06: 1 via ORAL
  Filled 2023-01-06: qty 1

## 2023-01-06 MED ORDER — AMLODIPINE BESYLATE 5 MG PO TABS
5.0000 mg | ORAL_TABLET | Freq: Every day | ORAL | Status: DC
  Administered 2023-01-06 – 2023-01-07 (×2): 5 mg via ORAL
  Filled 2023-01-06 (×2): qty 1

## 2023-01-06 MED ORDER — ACETAMINOPHEN 325 MG PO TABS: 650.0000 mg | ORAL_TABLET | Freq: Four times a day (QID) | ORAL | Status: DC | PRN

## 2023-01-06 MED ORDER — ACETAMINOPHEN 650 MG RE SUPP: 650.0000 mg | Freq: Four times a day (QID) | RECTAL | Status: DC | PRN

## 2023-01-06 MED ORDER — PROPRANOLOL HCL 10 MG PO TABS
40.0000 mg | ORAL_TABLET | Freq: Two times a day (BID) | ORAL | Status: DC
  Administered 2023-01-06 – 2023-01-07 (×2): 40 mg via ORAL
  Filled 2023-01-06 (×2): qty 4

## 2023-01-06 MED ORDER — HYDRALAZINE HCL 20 MG/ML IJ SOLN: 5.0000 mg | INTRAMUSCULAR | Status: DC | PRN

## 2023-01-06 MED ORDER — IRBESARTAN 300 MG PO TABS
300.0000 mg | ORAL_TABLET | Freq: Every day | ORAL | Status: DC
  Administered 2023-01-07: 300 mg via ORAL
  Filled 2023-01-06: qty 1

## 2023-01-06 MED ORDER — ATORVASTATIN CALCIUM 10 MG PO TABS
10.0000 mg | ORAL_TABLET | Freq: Every morning | ORAL | Status: DC
  Administered 2023-01-07: 10 mg via ORAL
  Filled 2023-01-06: qty 1

## 2023-01-06 MED ORDER — KETOROLAC TROMETHAMINE 15 MG/ML IJ SOLN: 15.0000 mg | Freq: Once | INTRAMUSCULAR | Status: DC

## 2023-01-06 MED ORDER — SODIUM CHLORIDE 0.9 % IV BOLUS
500.0000 mL | Freq: Once | INTRAVENOUS | Status: AC
  Administered 2023-01-06: 500 mL via INTRAVENOUS

## 2023-01-06 MED ORDER — ONDANSETRON 4 MG PO TBDP
4.0000 mg | ORAL_TABLET | Freq: Once | ORAL | Status: AC
  Administered 2023-01-06: 4 mg via ORAL
  Filled 2023-01-06: qty 1

## 2023-01-06 MED ORDER — LABETALOL HCL 5 MG/ML IV SOLN
5.0000 mg | Freq: Once | INTRAVENOUS | Status: AC
  Administered 2023-01-06: 5 mg via INTRAVENOUS
  Filled 2023-01-06: qty 4

## 2023-01-06 MED ORDER — SODIUM CHLORIDE 0.9 % IV SOLN: INTRAVENOUS | Status: DC

## 2023-01-06 MED ORDER — TRAZODONE HCL 50 MG PO TABS: 25.0000 mg | ORAL_TABLET | Freq: Every evening | ORAL | Status: DC | PRN

## 2023-01-06 NOTE — Progress Notes (Signed)
Pt reportedly presented to [facility] ED today for a fall that occurred yesterday. Denies HA. Per ED provider no focal deficits. There is an acute R SDH >1cm with 2-56mm MLS. Recommend transfer to [facility] by medicine service, progressive bed. Nsgy to formally consult. SBP<160. NPO. Repeat CTH in the AM. Call w/ questions/concerns.   Patrici Ranks, [facility]

## 2023-01-06 NOTE — ED Notes (Signed)
Report called to Allicia RN on [address] [place] at [employer]

## 2023-01-06 NOTE — Progress Notes (Signed)
Orthopedic Tech Progress Note Patient Details:  Michelle Fuller 23-Jan-1935 [id number]  Ortho Devices Type of Ortho Device: Shoulder immobilizer Ortho Device/Splint Location: left Ortho Device/Splint Interventions: Ordered, Application, Adjustment   Post Interventions Patient Tolerated: Well Instructions Provided: Adjustment of device, Care of device  Kizzie Fantasia 01/06/2023, 5:53 PM

## 2023-01-06 NOTE — ED Notes (Signed)
Assisted pt on/off bedpan

## 2023-01-06 NOTE — Progress Notes (Signed)
Orthopedic Tech Progress Note Patient Details:  Michelle Fuller Oct 08, 1934 [id number]  Patient ID: Michelle Fuller, female   DOB: 1934/06/10, 88 y.o.   MRN: [id number]  Michelle Fuller 01/06/2023, 5:53 PM Sling applied and instructed on proper use. Sling then removed bc of iv placement made flexion of arm uncomfortable. Sling placed on bed for use as needed. RN informed and PA informed.

## 2023-01-06 NOTE — ED Notes (Signed)
Pt taken for scans 

## 2023-01-06 NOTE — ED Notes (Signed)
Assisted pt off bedpan  

## 2023-01-06 NOTE — ED Triage Notes (Signed)
Pt states that she was evaluated at PCP yesterday for low energy and when getting home she had a fall in the driveway, landing on her L shoulder and striking her head. Denies LOC. Pt also states she slipped in the tub yesterday as well and is c/o pain in her buttock area. Pt denies blood thinner use. Pt also mentions that while at the doctor they told her she had some abnormal labs including hyponatremia.

## 2023-01-06 NOTE — ED Provider Notes (Signed)
[place] EMERGENCY DEPARTMENT AT [facility] Provider Note   CSN: [id number] Arrival date & time: 01/06/23  1314     History  Chief Complaint  Patient presents with   Marletta Lor    CAILI KRAUSER is a 88 y.o. female with medical history of breast cancer, depression, diverticulitis, tremor, goiter, hypertension, ovarian cyst, syncope.  Patient presents to ED for evaluation of fall.  Patient states that she was seen at her PCPs office yesterday for decreased energy.  States that she was advised to increase water intake, PCP seem to think the patient was depressed.  The patient states that she went home, was getting her mail out of her mailbox when she tripped over the curb falling and striking her head on the ground.  She also states that she injured her left shoulder during this fall.  She denies losing consciousness, denies taking blood thinning medication.  She reports that she was able to help her self up off the ground however later that night had a second fall in the bathroom.  She states that she slipped on a rug that had "no adhesion" to the bottom of it.  She reports that when she fell, she landed on her tailbone is been having pain "in between my cheeks" since then.  She denies any medications prior to arrival.  She states that she was advised by her PCP to start taking Tylenol to assist in sleep however denies any ibuprofen or other NSAID medications for pain.  She denies one-sided weakness or numbness, chest pain, shortness of breath preceding her falls or currently.  Denies any feelings of palpitation, lightheadedness, dizziness, weakness.  She was advised that her doctor's office that her sodium was low however she was just advised to increase water intake.   Fall       Home Medications Prior to Admission medications   Medication Sig Start Date End Date Taking? Authorizing Provider  amLODipine (NORVASC) 5 MG tablet Take 1 tablet (5 mg total) by mouth daily. 04/29/22    Meriam Sprague, MD  aspirin 81 MG tablet Take 1 tablet (81 mg total) by mouth daily. 01/16/13   Marinda Elk, MD  atorvastatin (LIPITOR) 10 MG tablet Take 1 tablet (10 mg total) by mouth every morning. 04/29/22   Meriam Sprague, MD  Calcium Citrate-Vitamin D (CITRACAL PETITES/VITAMIN D PO) Take 2 tablets by mouth 2 (two) times daily.    [provider]  carboxymethylcellulose (REFRESH PLUS) 0.5 % SOLN Place 1 drop into both eyes 2 (two) times daily.    [provider]  ciprofloxacin (CIPRO) 250 MG tablet Take 250 mg by mouth 2 (two) times daily between meals as needed (UTI). 02/25/17   [provider]  clonazePAM (KLONOPIN) 0.5 MG tablet Take 0.25 mg by mouth at bedtime. 09/09/14   [provider]  Flaxseed, Linseed, (SM FLAX SEED OIL) 1000 MG CAPS Take 1,000 mg by mouth every morning.     [provider]  ibandronate (BONIVA) 150 MG tablet Take 150 mg by mouth every 30 (thirty) days.    [provider]  indapamide (LOZOL) 1.25 MG tablet Take 1.25 mg by mouth every morning.     [provider]  Lactobacillus Rhamnosus, GG, (CULTURELLE PO) Take 1 capsule by mouth every morning.     [provider]  Multiple Vitamin (MULTIVITAMIN WITH MINERALS) TABS tablet Take 1 tablet by mouth every morning.    [provider]  omeprazole (PRILOSEC) 20  MG capsule Take 20 mg by mouth See admin instructions. Take 20 mg every day for two every four months    [provider]  polyethylene glycol powder (GLYCOLAX/MIRALAX) powder Take 17 g by mouth daily as needed for moderate constipation or severe constipation.    [provider]  primidone (MYSOLINE) 50 MG tablet Take 3 tablets (150 mg total) by mouth daily with breakfast. 01/08/14   Jerene Bears, MD  propranolol (INDERAL) 40 MG tablet Take 1 tablet (40 mg total) by mouth 2 (two) times daily. 04/29/22   Meriam Sprague, MD  valsartan (DIOVAN) 320 MG  tablet Take 1 tablet (320 mg total) by mouth every morning. 04/29/22   Meriam Sprague, MD      Allergies    Clonidine, Hydrocodone-acetaminophen, Latex, Flagyl [metronidazole], Penicillins, Shingrix [zoster vac recomb adjuvanted], and Sulfamethoxazole-trimethoprim    Review of Systems   Review of Systems  Musculoskeletal:  Positive for arthralgias and myalgias.  All other systems reviewed and are negative.   Physical Exam Updated Vital Signs BP (!) 177/103   Pulse 82   Temp 97.8 F (36.6 C) (Oral)   Resp (!) 21   LMP 05/25/1999   SpO2 96%  Physical Exam Vitals and nursing note reviewed.  Constitutional:      General: She is not in acute distress.    Appearance: Normal appearance. She is not ill-appearing, toxic-appearing or diaphoretic.  HENT:     Head: Normocephalic and atraumatic.     Nose: Nose normal.     Mouth/Throat:     Mouth: Mucous membranes are moist.     Pharynx: Oropharynx is clear.  Eyes:     Extraocular Movements: Extraocular movements intact.     Conjunctiva/sclera: Conjunctivae normal.     Pupils: Pupils are equal, round, and reactive to light.  Cardiovascular:     Rate and Rhythm: Normal rate and regular rhythm.  Pulmonary:     Effort: Pulmonary effort is normal.     Breath sounds: Normal breath sounds. No wheezing.  Abdominal:     General: Abdomen is flat. Bowel sounds are normal.     Palpations: Abdomen is soft.     Tenderness: There is no abdominal tenderness.  Musculoskeletal:     Cervical back: Normal range of motion and neck supple. No tenderness.     Comments: No obvious deformity patient left shoulder.  She does have decreased range of motion secondary to pain.  Nonfocal pain.  No overlying skin change.  Skin:    General: Skin is warm and dry.     Capillary Refill: Capillary refill takes less than 2 seconds.  Neurological:     General: No focal deficit present.     Mental Status: She is alert and oriented to person, place, and time.      GCS: GCS eye subscore is 4. GCS verbal subscore is 5. GCS motor subscore is 6.     Cranial Nerves: Cranial nerves 2-12 are intact. No cranial nerve deficit.     Sensory: Sensation is intact. No sensory deficit.     Motor: Motor function is intact. No weakness.     Coordination: Coordination is intact. Heel to [facility] Test normal.     Comments: Reassuring neurological examination without focal neurodeficits     ED Results / Procedures / Treatments   Labs (all labs ordered are listed, but only abnormal results are displayed) Labs Reviewed  BASIC METABOLIC PANEL - Abnormal; Notable for the following components:  Result Value   Sodium 129 (*)    Chloride 93 (*)    Glucose, Bld 108 (*)    All other components within normal limits  CBC - Abnormal; Notable for the following components:   RBC 5.27 (*)    Hemoglobin 15.5 (*)    All other components within normal limits  BASIC METABOLIC PANEL  CBC  OSMOLALITY  SODIUM, URINE, RANDOM    EKG EKG Interpretation Date/Time:  Thursday January 06 2023 13:37:48 EDT Ventricular Rate:  80 PR Interval:  220 QRS Duration:  158 QT Interval:  452 QTC Calculation: 522 R Axis:   268  Text Interpretation: Atrial-sensed ventricular-paced rhythm Prolonged PR interval   Confirmed by Fulton Reek [phone]) on 01/06/2023 1:56:47 PM  Radiology CT Head Wo Contrast  Result Date: 01/06/2023 CLINICAL DATA:  Provided history: Head trauma, moderate/severe. Fall (with head strike). EXAM: CT HEAD WITHOUT CONTRAST TECHNIQUE: Contiguous axial images were obtained from the base of the skull through the vertex without intravenous contrast. RADIATION DOSE REDUCTION: This exam was performed according to the departmental dose-optimization program which includes automated exposure control, adjustment of the mA and/or kV according to patient size and/or use of iterative reconstruction technique. COMPARISON:  Brain MRI 01/16/2013. FINDINGS: Motion degraded exam.  Brain: Generalized cerebral atrophy. High density subdural hematoma overlying the right cerebral hemisphere, measuring up to 2 cm in thickness (for instance as seen on series 4, image 39). Mass effect upon the underlying right cerebral hemisphere with partial effacement of the right lateral ventricle. 3 mm leftward midline shift. Patchy and ill-defined hypoattenuation within the cerebral white matter, nonspecific but compatible with mild chronic small vessel ischemic disease. Redemonstrated chronic infarcts within the bilateral cerebellar hemispheres. No demarcated cortical infarct. No evidence of an intracranial mass. Vascular: No hyperdense vessel.  Atherosclerotic calcifications. Skull: No acute calvarial fracture is identified, although evaluation is significantly limited by motion degradation. Sinuses/Orbits: Within the limitations of motion degradation, no acute orbital abnormality is identified. Mild mucosal thickening within the left maxillary sinus at the imaged levels. Impression #2 called by telephone at the time of interpretation on 01/06/2023 at 4:52 pm to provider [facility] , who verbally acknowledged these results. IMPRESSION: 1. Motion degraded examination. 2. Acute subdural hematoma overlying the right cerebral hemisphere measuring up to 2 cm in thickness. Mass effect upon the underlying brain parenchyma with partial effacement of the right lateral ventricle and 3 mm leftward midline shift. 3. No calvarial fracture is identified, although evaluation is significantly limited by motion degradation. Attention recommended on CT follow-up. 4. Mild chronic small vessel ischemic changes within the cerebral white matter. 5. Redemonstrated chronic infarcts within the bilateral cerebellar hemispheres. 6. Mild mucosal thickening within the left maxillary sinus at the imaged levels. Electronically Signed   By: Jackey Loge D.O.   On: 01/06/2023 16:54   DG Pelvis 1-2 Views  Result Date:  01/06/2023 CLINICAL DATA:  Fall, pelvic pain EXAM: PELVIS - 1-2 VIEW COMPARISON:  CT pelvis 03/27/2017 FINDINGS: Moderate degenerative hip arthropathy, right greater than left, with associated spurring as well as craniocaudad and axial loss of articular space. Degenerative subcortical cyst formation along the right posterior acetabulum. No observed fracture or acute bony findings. IMPRESSION: 1. Moderate degenerative hip arthropathy, right greater than left. No acute bony findings. Electronically Signed   By: Gaylyn Rong M.D.   On: 01/06/2023 16:38   DG Shoulder Left  Result Date: 01/06/2023 CLINICAL DATA:  Fall, landing on left shoulder.  Left shoulder pain. EXAM:  LEFT SHOULDER - 2+ VIEW COMPARISON:  Chest radiograph 05/27/2020 FINDINGS: Pulse generator proximal leads from pacer noted. No fracture or malalignment observed. Subacromial morphology is type 2 (curved). No significant arthropathy. IMPRESSION: 1. No fracture or malalignment. Electronically Signed   By: Gaylyn Rong M.D.   On: 01/06/2023 16:36    Procedures Procedures   Medications Ordered in ED Medications  propranolol (INDERAL) tablet 40 mg (has no administration in time range)  amLODipine (NORVASC) tablet 5 mg (has no administration in time range)  irbesartan (AVAPRO) tablet 300 mg (has no administration in time range)  atorvastatin (LIPITOR) tablet 10 mg (has no administration in time range)  0.9 %  sodium chloride infusion (has no administration in time range)  acetaminophen (TYLENOL) tablet 650 mg (has no administration in time range)    Or  acetaminophen (TYLENOL) suppository 650 mg (has no administration in time range)  traZODone (DESYREL) tablet 25 mg (has no administration in time range)  hydrALAZINE (APRESOLINE) injection 5 mg (has no administration in time range)  sodium chloride 0.9 % bolus 500 mL (0 mLs Intravenous Stopped 01/06/23 1511)  HYDROcodone-acetaminophen (NORCO/VICODIN) 5-325 MG per tablet 1 tablet  (1 tablet Oral Given 01/06/23 1659)  ondansetron (ZOFRAN-ODT) disintegrating tablet 4 mg (4 mg Oral Given 01/06/23 1659)  labetalol (NORMODYNE) injection 5 mg (5 mg Intravenous Given 01/06/23 1732)    ED Course/ Medical Decision Making/ A&P Clinical Course as of 01/06/23 1913  Thu Jan 06, 2023  1713 Acute subdural hematoma overlying the right cerebral hemisphere measuring up to 2 cm in thickness. Mass effect upon the underlying brain parenchyma with partial effacement of the right lateral ventricle and 3 mm leftward midline shift.   [CG]  1718 Transfer to main, keep systolic under 160, admit to medicine, rescan in morning [CG]  1718 Keep her NPO [CG]    Clinical Course User Index [CG] Al Decant, PA-C       Medical Decision Making Amount and/or Complexity of Data Reviewed Labs: ordered. Radiology: ordered.  Risk Prescription drug management.   88 year old female presents to ED for evaluation.  Please see HPI for further details.   On examination patient is afebrile and nontachycardic.  Her lung sounds are clear bilaterally, she is not hypoxic.  Abdomen is soft and compressible throughout.  Neurological examination at baseline without focal neurodeficits.  She is overall nontoxic in appearance.  Her left shoulder does have decreased range of motion secondary to pain however no obvious deformity.  She has no neck tenderness.  Patient CBC shows no leukocytosis, no anemia.  Her metabolic panel is showing a sodium of 129, anion gap 11, creatinine 0.75.  The patient chart review shows that she is often hyponatremic.  X-ray imaging of patient left shoulder unremarkable.  Patient placed in left shoulder sling for comfort.  X-ray imaging of patient pelvis unremarkable.  CT scan of patient head shows an acute subdural hematoma overlying the right cerebral hemisphere measuring up to 2 cm in thickness.  There is mass effect upon the underlying brain parenchyma with partial  effacement of the right lateral ventricle and 3 mm leftward midline shift.  These imaging study findings were called to me by Dr. Renette Butters.  Patient given 5 mg hydrocodone for pain, for milligram Zofran for nausea and vomiting, 500 mL of fluid.  Reached out to neurosurgery.  Spoke with Maralyn Sago, NP of neurosurgery.  Maralyn Sago states that the patient should be transferred to main campus, her blood pressure should be kept under  160 systolic, she should be kept NPO.  She is requesting that we admit the patient to the hospital service for further management and care.  Patient given 5 mg of labetalol for blood pressure control this time.  Patient blood pressure currently 145/90.  Patient admitted to Dr. Kirby Crigler for further management and care.   Final Clinical Impression(s) / ED Diagnoses Final diagnoses:  Subdural hematoma (HCC)  Fall, initial encounter    Rx / DC Orders ED Discharge Orders     None         Clent Ridges 01/06/23 1913    Laurence Spates, MD 01/07/23 [phone]

## 2023-01-06 NOTE — H&P (Signed)
History and Physical  Michelle Fuller VWU:[id number] DOB: 07-24-1934 DOA: 01/06/2023  PCP: Charlane Ferretti, DO   Chief Complaint: Fall at home  HPI: Michelle Fuller is a 88 y.o. female with medical history significant for hypertension, sick sinus syndrome with pacemaker being admitted to the hospital with traumatic subdural hemorrhage.  She had a mechanical fall yesterday as she was coming home from her PCPs office, thinks that she tripped on the uneven concrete of her driveway, fell forward and hit her head.  Denies any chest pain, prodrome of falling, loss of consciousness.  She was able to get up and ambulate afterwards.  Since then, she slipped and fell in the bathtub at home as well, feels that she was walking unsteady and so came to the ER today for evaluation.  ED Course: In the emergency department, she has had some hypertension but vital signs otherwise unremarkable.  Lab work reveals sodium 129, which is relatively new for her.  CT scan was done as noted below with acute right subdural hemorrhage with 2 to 3 mm midline shift.  ER provider discussed with Paticia Stack, PA with neurosurgery who also discussed with Dr. Maisie Fus with neurosurgery.  They recommend hospitalist admission to [facility], keep n.p.o., and keep blood pressure below 160.  Patient has just received a dose of IV labetalol.  Currently she is resting comfortably, has no complaints.  Review of Systems: Please see HPI for pertinent positives and negatives. A complete 10 system review of systems are otherwise negative.  Past Medical History:  Diagnosis Date   Abnormal Pap smear of cervix    Breast cancer (HCC)    Right breast, status post XRT   Cataracts, both eyes    Chest pain 2015   recent episode while in sun- recovered in shade with water   Colon polyp    Depression    Diverticulitis    Diverticulosis    Elevated LDL cholesterol level    Essential tremor    Goiter    multinodular   Hyperlipidemia     Hypertension    Osteopenia 8/97   Spine   Ovarian cyst    found on CT scan   Post-menopausal bleeding 01/2000   Huge polyps exc   Syncope    Vitamin D deficiency    Past Surgical History:  Procedure Laterality Date   Bilateral cateract surgery  2014   BREAST LUMPECTOMY Right 4/96   HYSTEROSCOPY  0/01   LAPAROSCOPIC BILATERAL SALPINGO OOPHERECTOMY Bilateral 01/07/2014   Procedure: LAPAROSCOPIC BILATERAL SALPINGO OOPHORECTOMY;  Surgeon: Annamaria Boots, MD;  Location: [facility];  Service: Gynecology;  Laterality: Bilateral;   PACEMAKER IMPLANT N/A 05/26/2020   Procedure: PACEMAKER IMPLANT;  Surgeon: Lanier Prude, MD;  Location: [facility] INVASIVE CV LAB;  Service: Cardiovascular;  Laterality: N/A;    Social History:  reports that she has never smoked. She has never used smokeless tobacco. She reports that she does not drink alcohol and does not use drugs.   Allergies  Allergen Reactions   Clonidine Other (See Comments)    Fatigue drymouth   Hydrocodone-Acetaminophen Nausea Only   Latex Itching and Rash   Flagyl [Metronidazole] Hives, Nausea And Vomiting, Nausea Only and Rash   Penicillins Hives and Rash    Reaction: 2 year ago   Shingrix [Zoster Vac Recomb Adjuvanted] Other (See Comments)    Chills, nausea, diarrhea, and lightheadedness after second dose   Sulfamethoxazole-Trimethoprim Rash    Family History  Problem Relation Age  of Onset   Heart disease Mother        Died at 40 of MI - says doctors thought it was due to mistreatment of thyroid dz in youth   Thyroid disease Mother    Heart attack Mother    Alzheimer's disease Father    Thyroid disease Maternal Grandmother    Stroke Maternal Grandmother      Prior to Admission medications   Medication Sig Start Date End Date Taking? Authorizing Provider  amLODipine (NORVASC) 5 MG tablet Take 1 tablet (5 mg total) by mouth daily. 04/29/22   Meriam Sprague, MD  aspirin 81 MG tablet Take 1 tablet (81 mg total) by mouth  daily. 01/16/13   Marinda Elk, MD  atorvastatin (LIPITOR) 10 MG tablet Take 1 tablet (10 mg total) by mouth every morning. 04/29/22   Meriam Sprague, MD  Calcium Citrate-Vitamin D (CITRACAL PETITES/VITAMIN D PO) Take 2 tablets by mouth 2 (two) times daily.    [provider]  carboxymethylcellulose (REFRESH PLUS) 0.5 % SOLN Place 1 drop into both eyes 2 (two) times daily.    [provider]  ciprofloxacin (CIPRO) 250 MG tablet Take 250 mg by mouth 2 (two) times daily between meals as needed (UTI). 02/25/17   [provider]  clonazePAM (KLONOPIN) 0.5 MG tablet Take 0.25 mg by mouth at bedtime. 09/09/14   [provider]  Flaxseed, Linseed, (SM FLAX SEED OIL) 1000 MG CAPS Take 1,000 mg by mouth every morning.     [provider]  ibandronate (BONIVA) 150 MG tablet Take 150 mg by mouth every 30 (thirty) days.    [provider]  indapamide (LOZOL) 1.25 MG tablet Take 1.25 mg by mouth every morning.     [provider]  Lactobacillus Rhamnosus, GG, (CULTURELLE PO) Take 1 capsule by mouth every morning.     [provider]  Multiple Vitamin (MULTIVITAMIN WITH MINERALS) TABS tablet Take 1 tablet by mouth every morning.    [provider]  omeprazole (PRILOSEC) 20 MG capsule Take 20 mg by mouth See admin instructions. Take 20 mg every day for two every four months    [provider]  polyethylene glycol powder (GLYCOLAX/MIRALAX) powder Take 17 g by mouth daily as needed for moderate constipation or severe constipation.    [provider]  primidone (MYSOLINE) 50 MG tablet Take 3 tablets (150 mg total) by mouth daily with breakfast. 01/08/14   Jerene Bears, MD  propranolol (INDERAL) 40 MG tablet Take 1 tablet (40 mg total) by mouth 2 (two) times daily. 04/29/22   Meriam Sprague, MD  valsartan (DIOVAN) 320 MG tablet Take 1 tablet (320 mg total) by mouth every morning. 04/29/22   Meriam Sprague, MD    Physical Exam: BP (!) 177/103   Pulse 82   Temp 98.2 F (36.8 C) (Oral)   Resp (!) 21   LMP 05/25/1999   SpO2 96%   General:  Alert, oriented, calm, in no acute distress, Caucasian female appearing slightly younger than her stated age. Eyes: EOMI, clear conjuctivae, white sclerea Neck: supple, no masses, trachea mildline  Cardiovascular: RRR, no murmurs or rubs, no peripheral edema  Respiratory: clear to auscultation bilaterally, no wheezes, no crackles  Abdomen: soft, nontender, nondistended, normal bowel tones heard  Skin: dry, no rashes  Musculoskeletal: no joint effusions, normal range of motion, mild erythema without point tenderness of the left knee Psychiatric: appropriate affect, normal speech  Neurologic:  extraocular muscles intact, clear speech, moving all extremities with intact sensorium          Labs on Admission:  Basic Metabolic Panel: Recent Labs  Lab 01/06/23 1349  NA 129*  K 3.6  CL 93*  CO2 25  GLUCOSE 108*  BUN 17  CREATININE 0.75  CALCIUM 9.3   Liver Function Tests: No results for input(s): "AST", "ALT", "ALKPHOS", "BILITOT", "PROT", "ALBUMIN" in the last 168 hours. No results for input(s): "LIPASE", "AMYLASE" in the last 168 hours. No results for input(s): "AMMONIA" in the last 168 hours. CBC: Recent Labs  Lab 01/06/23 1443  WBC 9.1  HGB 15.5*  HCT 46.0  MCV 87.3  PLT 289   Cardiac Enzymes: No results for input(s): "CKTOTAL", "CKMB", "CKMBINDEX", "TROPONINI" in the last 168 hours.  BNP (last 3 results) No results for input(s): "BNP" in the last 8760 hours.  ProBNP (last 3 results) No results for input(s): "PROBNP" in the last 8760 hours.  CBG: No results for input(s): "GLUCAP" in the last 168 hours.  Radiological Exams on Admission: CT Head Wo Contrast  Result Date: 01/06/2023 CLINICAL DATA:  Provided history: Head trauma, moderate/severe. Fall (with head strike). EXAM: CT HEAD WITHOUT CONTRAST TECHNIQUE:  Contiguous axial images were obtained from the base of the skull through the vertex without intravenous contrast. RADIATION DOSE REDUCTION: This exam was performed according to the departmental dose-optimization program which includes automated exposure control, adjustment of the mA and/or kV according to patient size and/or use of iterative reconstruction technique. COMPARISON:  Brain MRI 01/16/2013. FINDINGS: Motion degraded exam. Brain: Generalized cerebral atrophy. High density subdural hematoma overlying the right cerebral hemisphere, measuring up to 2 cm in thickness (for instance as seen on series 4, image 39). Mass effect upon the underlying right cerebral hemisphere with partial effacement of the right lateral ventricle. 3 mm leftward midline shift. Patchy and ill-defined hypoattenuation within the cerebral white matter, nonspecific but compatible with mild chronic small vessel ischemic disease. Redemonstrated chronic infarcts within the bilateral cerebellar hemispheres. No demarcated cortical infarct. No evidence of an intracranial mass. Vascular: No hyperdense vessel.  Atherosclerotic calcifications. Skull: No acute calvarial fracture is identified, although evaluation is significantly limited by motion degradation. Sinuses/Orbits: Within the limitations of motion degradation, no acute orbital abnormality is identified. Mild mucosal thickening within the left maxillary sinus at the imaged levels. Impression #2 called by telephone at the time of interpretation on 01/06/2023 at 4:52 pm to provider [facility] , who verbally acknowledged these results. IMPRESSION: 1. Motion degraded examination. 2. Acute subdural hematoma overlying the right cerebral hemisphere measuring up to 2 cm in thickness. Mass effect upon the underlying brain parenchyma with partial effacement of the right lateral ventricle and 3 mm leftward midline shift. 3. No calvarial fracture is identified, although evaluation is  significantly limited by motion degradation. Attention recommended on CT follow-up. 4. Mild chronic small vessel ischemic changes within the cerebral white matter. 5. Redemonstrated chronic infarcts within the bilateral cerebellar hemispheres. 6. Mild mucosal thickening within the left maxillary sinus at the imaged levels. Electronically Signed   By: Jackey Loge D.O.   On: 01/06/2023 16:54   DG Pelvis 1-2 Views  Result Date: 01/06/2023 CLINICAL DATA:  Fall, pelvic pain EXAM: PELVIS - 1-2 VIEW COMPARISON:  CT pelvis 03/27/2017 FINDINGS: Moderate degenerative hip arthropathy, right greater than left, with associated spurring as well as craniocaudad and axial loss of articular space. Degenerative subcortical cyst formation along the right posterior acetabulum. No observed fracture  or acute bony findings. IMPRESSION: 1. Moderate degenerative hip arthropathy, right greater than left. No acute bony findings. Electronically Signed   By: Gaylyn Rong M.D.   On: 01/06/2023 16:38   DG Shoulder Left  Result Date: 01/06/2023 CLINICAL DATA:  Fall, landing on left shoulder.  Left shoulder pain. EXAM: LEFT SHOULDER - 2+ VIEW COMPARISON:  Chest radiograph 05/27/2020 FINDINGS: Pulse generator proximal leads from pacer noted. No fracture or malalignment observed. Subacromial morphology is type 2 (curved). No significant arthropathy. IMPRESSION: 1. No fracture or malalignment. Electronically Signed   By: Gaylyn Rong M.D.   On: 01/06/2023 16:36    Assessment/Plan 88 year old female with a history of hypertension, sick sinus syndrome with pacemaker being admitted to the hospital with acute subdural hemorrhage after traumatic mechanical fall 8/14.  Acute subdural hemorrhage-as a result of her mechanical fall yesterday -Inpatient admission to progressive unit at [facility] -Keep n.p.o. -Avoid blood thinners -ER provider discussed with neurosurgery, who will consult formally upon arrival to [facility] -Will keep SBP  less than 160 -Repeat noncontrast CT in the morning  Hyponatremia-quite mild and possibly inconsequential, but could also be contributing to her instability and falls -Check Osm, urine sodium -Hydrate with normal saline -Recheck sodium in the morning  Hypertension -continue home amlodipine, ARB, Inderal -IV hydralazine 5 mg as needed for SBP greater than 160  Hyperlipidemia-Lipitor  DVT prophylaxis: SCDs only    Code Status: Full Code  Consults called: Neurosurgery  Admission status: The appropriate patient status for this patient is INPATIENT. Inpatient status is judged to be reasonable and necessary in order to provide the required intensity of service to ensure the patient's safety. The patient's presenting symptoms, physical exam findings, and initial radiographic and laboratory data in the context of their chronic comorbidities is felt to place them at high risk for further clinical deterioration. Furthermore, it is not anticipated that the patient will be medically stable for discharge from the hospital within 2 midnights of admission.    I certify that at the point of admission it is my clinical judgment that the patient will require inpatient hospital care spanning beyond 2 midnights from the point of admission due to high intensity of service, high risk for further deterioration and high frequency of surveillance required  Time spent: 42 minutes  Hilliary Jock Sharlette Dense MD [facility] Pager [phone]  If 7PM-7AM, please contact night-coverage [email or web address] Password [facility]  01/06/2023, 5:42 PM

## 2023-01-06 NOTE — ED Notes (Signed)
[facility] called for transport. 

## 2023-01-07 ENCOUNTER — Other Ambulatory Visit: Payer: Self-pay

## 2023-01-07 ENCOUNTER — Encounter (HOSPITAL_COMMUNITY): Payer: Self-pay | Admitting: Internal Medicine

## 2023-01-07 ENCOUNTER — Inpatient Hospital Stay (HOSPITAL_COMMUNITY): Payer: Medicare PPO

## 2023-01-07 ENCOUNTER — Other Ambulatory Visit: Payer: Self-pay | Admitting: Internal Medicine

## 2023-01-07 DIAGNOSIS — S065XAA Traumatic subdural hemorrhage with loss of consciousness status unknown, initial encounter: Secondary | ICD-10-CM | POA: Diagnosis not present

## 2023-01-07 LAB — BASIC METABOLIC PANEL
Anion gap: 11 (ref 5–15)
BUN: 11 mg/dL (ref 8–23)
CO2: 23 mmol/L (ref 22–32)
Calcium: 8.9 mg/dL (ref 8.9–10.3)
Chloride: 99 mmol/L (ref 98–111)
Creatinine, Ser: 0.9 mg/dL (ref 0.44–1.00)
GFR, Estimated: >60 mL/min (ref >60)
Glucose, Bld: 100 mg/dL — ABNORMAL HIGH (ref 70–99)
Potassium: 2.9 mmol/L — ABNORMAL LOW (ref 3.5–5.1)
Sodium: 133 mmol/L — ABNORMAL LOW (ref 135–145)

## 2023-01-07 LAB — CBC
HCT: 41.7 % (ref 36.0–46.0)
Hemoglobin: 14.5 g/dL (ref 12.0–15.0)
MCH: 30 pg (ref 26.0–34.0)
MCHC: 34.8 g/dL (ref 30.0–36.0)
MCV: 86.3 fL (ref 80.0–100.0)
Platelets: 276 10*3/uL (ref 150–400)
RBC: 4.83 MIL/uL (ref 3.87–5.11)
RDW: 12.7 % (ref 11.5–15.5)
WBC: 7.6 10*3/uL (ref 4.0–10.5)
nRBC: 0 % (ref 0.0–0.2)

## 2023-01-07 LAB — MAGNESIUM: Magnesium: 2.1 mg/dL (ref 1.7–2.4)

## 2023-01-07 LAB — OSMOLALITY: Osmolality: 278 mosm/kg (ref 275–295)

## 2023-01-07 MED ORDER — HYDROCODONE-ACETAMINOPHEN 5-325 MG PO TABS: 1.0000 | ORAL_TABLET | Freq: Four times a day (QID) | ORAL | 0 refills | Status: AC | PRN

## 2023-01-07 MED ORDER — ONDANSETRON HCL 4 MG PO TABS: 4.0000 mg | ORAL_TABLET | Freq: Three times a day (TID) | ORAL | 0 refills | Status: AC | PRN

## 2023-01-07 MED ORDER — HYDROCODONE-ACETAMINOPHEN 5-325 MG PO TABS
1.0000 | ORAL_TABLET | Freq: Four times a day (QID) | ORAL | Status: DC | PRN
  Administered 2023-01-07: 1 via ORAL
  Filled 2023-01-07: qty 1

## 2023-01-07 MED ORDER — ORAL CARE MOUTH RINSE: 15.0000 mL | OROMUCOSAL | Status: DC | PRN

## 2023-01-07 MED ORDER — OXYCODONE HCL 5 MG PO TABS: 5.0000 mg | ORAL_TABLET | Freq: Four times a day (QID) | ORAL | 0 refills | Status: AC | PRN

## 2023-01-07 MED ORDER — ONDANSETRON HCL 4 MG/2ML IJ SOLN
4.0000 mg | Freq: Four times a day (QID) | INTRAMUSCULAR | Status: DC | PRN
  Administered 2023-01-07: 4 mg via INTRAVENOUS
  Filled 2023-01-07: qty 2

## 2023-01-07 MED ORDER — POTASSIUM CHLORIDE CRYS ER 20 MEQ PO TBCR
30.0000 meq | EXTENDED_RELEASE_TABLET | ORAL | Status: DC
  Administered 2023-01-07 (×2): 30 meq via ORAL
  Filled 2023-01-07 (×2): qty 1

## 2023-01-07 NOTE — Discharge Summary (Signed)
Physician Discharge Summary  Michelle Fuller JXB:[id number] DOB: August 13, 1934 DOA: 01/06/2023  PCP: Charlane Ferretti, DO  Admit date: 01/06/2023 Discharge date: 01/07/2023  Admitted From: Home Disposition: Home  Recommendations for Outpatient Follow-up:  Follow up with PCP in 1-2 weeks Follow-up with neurosurgery in 2 weeks either with Dr. Maisie Fus or Patrici Ranks, [facility] in regards to subdural hematoma; will need repeat CT head without contrast prior to visit Continue to hold home aspirin until follow-up with PCP/neurosurgery  Home Health: PT Equipment/Devices: No further equipment needs identified by physical therapy  Discharge Condition: Stable CODE STATUS: Full code Diet recommendation: Heart healthy diet  History of present illness:  Michelle Fuller is an 88 year old female with past medical history significant for HTN, SSS s/p PPM, HLD, CVA, breast cancer s/p XRT who presented to [facility] ED on 8/15 following mechanical fall in her driveway.  Complaining of left shoulder pain and striking her head but denies loss of consciousness.  Believes she tripped on uneven concrete in her driveway.  She was able to get up and ambulate afterwards, but send she slipped and fell in the bathtub at home and was walking unsteady so came to the ED for further evaluation.  Denies chest pain, no prodrome of falling, no loss of consciousness.  In the ED, temperature 98.2 F, HR 82, RR 13, BP 173/92, SpO2 98% on room air.  WBC 9.1, hemoglobin 15.5, platelet count 289.  Sodium 129, potassium 3.6, chloride 93, CO2 25, glucose 108, BUN 17, creatinine 0.75.  Left shoulder x-ray with no fracture or malalignment.  Pelvis x-ray with moderate degenerative hip arthropathy, right greater than left, no acute bony changes.  CT head without contrast with acute subdural hematoma overlying the right cerebral hemisphere measuring 2 cm in thickness, mass effect upon the underlying brain parenchyma with partial  effacement of the right lateral ventricle and 3 mm leftward midline shift, no calvarial fracture mild chronic small vessel ischemic changes within the white cerebral matter, chronic infarcts within the bilateral cerebellar hemispheres.  Neurosurgery was consulted, EDP discussed with Patrici Ranks, PA with neurosurgery who also discussed with Dr. Maisie Fus with neurosurgery. They recommend hospitalist admission to [facility], keep n.p.o., and keep blood pressure below 160. Patient has just received a dose of IV labetalol.  Patient was transferred to [facility] under the hospitalist service.  Hospital course:  Right subdural hematoma with mild midline shift Patient presenting to the ED following 2 falls at home, initially striking her head in the driveway without loss of consciousness.  She had the ability to ambulate thereafter but sustained a second fall in her bathroom which prompted her presentation to the ED.  CT head without contrast with acute subdural hematoma measuring 2 cm with 3 mm leftward midline shift.  Neurosurgery was consulted who recommended maintaining n.p.o. status and transfer to [facility] with repeat imaging.  Patient underwent repeat CT head without contrast on 8/16 with findings of unchanged subdural hematoma on the right with 4 mm midline shift which is stable.  Was reevaluated by neurosurgery and given no neurodeficits and stability of SDH; okay to discharge home with outpatient follow-up in 2 weeks.  If patient became symptomatic neurosurgery could consider right MMA embolization versus bur holes versus craniotomy.  Patient was seen by physical therapy with recommendations of home health.  Patient's symptoms resolved dramatically sooner than expected.  Will continue to hold home aspirin until follows up with PCP/neurosurgery.  Outpatient follow-up with PCP 1 week,  outpatient follow-up with neurosurgery 2 weeks.  Will need repeat CT head without contrast prior to neurosurgery  visit.  Essential hypertension Continue amlodipine 5 mg p.o. daily, valsartan 320 mg p.o. daily, propranolol 40 mg p.o. twice daily, indapamide 1.25mg  PO daily.  Holding home aspirin as above.  Sick sinus syndrome s/p PPM Continue outpatient follow-up with cardiology, electrophysiology  Hyperlipidemia Continue atorvastatin 10 mg p.o. daily.  Discharge Diagnoses:  Principal Problem:   SDH (subdural hematoma) [facility]    Discharge Instructions  Discharge Instructions     Call MD for:  difficulty breathing, headache or visual disturbances   Complete by: As directed    Call MD for:  extreme fatigue   Complete by: As directed    Call MD for:  persistant dizziness or light-headedness   Complete by: As directed    Call MD for:  persistant nausea and vomiting   Complete by: As directed    Call MD for:  severe uncontrolled pain   Complete by: As directed    Call MD for:  temperature >100.4   Complete by: As directed    Diet - low sodium heart healthy   Complete by: As directed    Increase activity slowly   Complete by: As directed       Allergies as of 01/07/2023       Reactions   Clonidine Other (See Comments)   Fatigue drymouth   Hydrocodone-acetaminophen Nausea Only   Latex Itching, Rash   Flagyl [metronidazole] Hives, Nausea And Vomiting, Nausea Only, Rash   Penicillins Hives, Rash   Reaction: 2 year ago   Shingrix [zoster Vac Recomb Adjuvanted] Other (See Comments)   Chills, nausea, diarrhea, and lightheadedness after second dose   Sulfamethoxazole-trimethoprim Rash        Medication List     STOP taking these medications    aspirin 81 MG tablet       TAKE these medications    amLODipine 5 MG tablet Commonly known as: NORVASC Take 1 tablet (5 mg total) by mouth daily.   atorvastatin 10 MG tablet Commonly known as: LIPITOR Take 1 tablet (10 mg total) by mouth every morning.   carboxymethylcellulose 0.5 % Soln Commonly known as: REFRESH PLUS Place  1 drop into both eyes 2 (two) times daily.   ciprofloxacin 250 MG tablet Commonly known as: CIPRO Take 250 mg by mouth 2 (two) times daily between meals as needed (UTI).   CITRACAL PETITES/VITAMIN D PO Take 2 tablets by mouth 2 (two) times daily.   clonazePAM 0.5 MG tablet Commonly known as: KLONOPIN Take 0.25 mg by mouth at bedtime.   CULTURELLE PO Take 1 capsule by mouth every morning.   ibandronate 150 MG tablet Commonly known as: BONIVA Take 150 mg by mouth every 30 (thirty) days.   indapamide 1.25 MG tablet Commonly known as: LOZOL Take 1.25 mg by mouth every morning.   multivitamin with minerals Tabs tablet Take 1 tablet by mouth every morning.   omeprazole 20 MG capsule Commonly known as: PRILOSEC Take 20 mg by mouth See admin instructions. Take 20 mg every day for two every four months   polyethylene glycol powder 17 GM/SCOOP powder Commonly known as: GLYCOLAX/MIRALAX Take 17 g by mouth daily as needed for moderate constipation or severe constipation.   primidone 50 MG tablet Commonly known as: MYSOLINE Take 3 tablets (150 mg total) by mouth daily with breakfast.   propranolol 40 MG tablet Commonly known as: INDERAL Take 1 tablet (40  mg total) by mouth 2 (two) times daily.   SM Flax Seed Oil 1000 MG Caps Take 1,000 mg by mouth every morning.   valsartan 320 MG tablet Commonly known as: DIOVAN Take 1 tablet (320 mg total) by mouth every morning.        Follow-up Information     Charlane Ferretti, DO. Schedule an appointment as soon as possible for a visit in 1 week(s).   Specialty: Internal Medicine Why: Will need repeat CT head without contrast prior to neurosurgery follow-up Contact information: [address] [place] [address] [phone]         Bedelia Person, MD. Schedule an appointment as soon as possible for a visit in 2 week(s).   Specialty: Neurosurgery Why: Will need repeat CT head without contrast prior to appointment Contact  information: [address] [place] [address] [phone]         Patrici Ranks [place], [place]. Schedule an appointment as soon as possible for a visit in 2 week(s).   Specialty: Neurosurgery Contact information: [address] [place] [address] [phone]                Allergies  Allergen Reactions   Clonidine Other (See Comments)    Fatigue drymouth   Hydrocodone-Acetaminophen Nausea Only   Latex Itching and Rash   Flagyl [Metronidazole] Hives, Nausea And Vomiting, Nausea Only and Rash   Penicillins Hives and Rash    Reaction: 2 year ago   Shingrix [Zoster Vac Recomb Adjuvanted] Other (See Comments)    Chills, nausea, diarrhea, and lightheadedness after second dose   Sulfamethoxazole-Trimethoprim Rash    Consultations: Neurosurgery   Procedures/Studies: CT HEAD WO CONTRAST ( )  Result Date: 01/07/2023 CLINICAL DATA:  Minor head trauma EXAM: CT HEAD WITHOUT CONTRAST TECHNIQUE: Contiguous axial images were obtained from the base of the skull through the vertex without intravenous contrast. RADIATION DOSE REDUCTION: This exam was performed according to the departmental dose-optimization program which includes automated exposure control, adjustment of the mA and/or kV according to patient size and/or use of iterative reconstruction technique. COMPARISON:  Head CT from yesterday FINDINGS: Brain: A high to isodense subdural hematoma along the right cerebral convexity shows no sign of interval bleeding, maximal thickness is along the right frontal convexity a 2.1 cm. The brain is atrophic and leftward midline shift is 4 mm. Small chronic left cerebellar infarct. No hydrocephalus Vascular: No hyperdense vessel or unexpected calcification. Skull: Normal. Negative for fracture or focal lesion. Small bone fragments medial to the left TMJ, likely degenerative. Sinuses/Orbits: No acute finding IMPRESSION: Compared to yesterday, unchanged subdural  hematoma on the right with 4 mm of midline shift. Electronically Signed   By: Tiburcio Pea M.D.   On: 01/07/2023 05:47   CT Head Wo Contrast  Result Date: 01/06/2023 CLINICAL DATA:  Provided history: Head trauma, moderate/severe. Fall (with head strike). EXAM: CT HEAD WITHOUT CONTRAST TECHNIQUE: Contiguous axial images were obtained from the base of the skull through the vertex without intravenous contrast. RADIATION DOSE REDUCTION: This exam was performed according to the departmental dose-optimization program which includes automated exposure control, adjustment of the mA and/or kV according to patient size and/or use of iterative reconstruction technique. COMPARISON:  Brain MRI 01/16/2013. FINDINGS: Motion degraded exam. Brain: Generalized cerebral atrophy. High density subdural hematoma overlying the right cerebral hemisphere, measuring up to 2 cm in thickness (for instance as seen on series 4, image 39). Mass effect upon the underlying  right cerebral hemisphere with partial effacement of the right lateral ventricle. 3 mm leftward midline shift. Patchy and ill-defined hypoattenuation within the cerebral white matter, nonspecific but compatible with mild chronic small vessel ischemic disease. Redemonstrated chronic infarcts within the bilateral cerebellar hemispheres. No demarcated cortical infarct. No evidence of an intracranial mass. Vascular: No hyperdense vessel.  Atherosclerotic calcifications. Skull: No acute calvarial fracture is identified, although evaluation is significantly limited by motion degradation. Sinuses/Orbits: Within the limitations of motion degradation, no acute orbital abnormality is identified. Mild mucosal thickening within the left maxillary sinus at the imaged levels. Impression #2 called by telephone at the time of interpretation on 01/06/2023 at 4:52 pm to provider [facility] , who verbally acknowledged these results. IMPRESSION: 1. Motion degraded examination. 2. Acute  subdural hematoma overlying the right cerebral hemisphere measuring up to 2 cm in thickness. Mass effect upon the underlying brain parenchyma with partial effacement of the right lateral ventricle and 3 mm leftward midline shift. 3. No calvarial fracture is identified, although evaluation is significantly limited by motion degradation. Attention recommended on CT follow-up. 4. Mild chronic small vessel ischemic changes within the cerebral white matter. 5. Redemonstrated chronic infarcts within the bilateral cerebellar hemispheres. 6. Mild mucosal thickening within the left maxillary sinus at the imaged levels. Electronically Signed   By: Jackey Loge D.O.   On: 01/06/2023 16:54   DG Pelvis 1-2 Views  Result Date: 01/06/2023 CLINICAL DATA:  Fall, pelvic pain EXAM: PELVIS - 1-2 VIEW COMPARISON:  CT pelvis 03/27/2017 FINDINGS: Moderate degenerative hip arthropathy, right greater than left, with associated spurring as well as craniocaudad and axial loss of articular space. Degenerative subcortical cyst formation along the right posterior acetabulum. No observed fracture or acute bony findings. IMPRESSION: 1. Moderate degenerative hip arthropathy, right greater than left. No acute bony findings. Electronically Signed   By: Gaylyn Rong M.D.   On: 01/06/2023 16:38   DG Shoulder Left  Result Date: 01/06/2023 CLINICAL DATA:  Fall, landing on left shoulder.  Left shoulder pain. EXAM: LEFT SHOULDER - 2+ VIEW COMPARISON:  Chest radiograph 05/27/2020 FINDINGS: Pulse generator proximal leads from pacer noted. No fracture or malalignment observed. Subacromial morphology is type 2 (curved). No significant arthropathy. IMPRESSION: 1. No fracture or malalignment. Electronically Signed   By: Gaylyn Rong M.D.   On: 01/06/2023 16:36     Subjective: Patient seen examined bedside, resting calmly.  Lying in bed.  Family present.  Neurosurgery at bedside, repeat CT head stable.  Seen by PT this morning with  recommendations of home health.  Patient feels much improved and has progressed faster than expected during the hospitalization and ready for discharge home.  Denies headache, no visual changes, no chest pain, no palpitations, no shortness of breath, no nausea/vomiting/diarrhea, no focal weakness, no abdominal pain, no fever/chills/night sweats, no nausea/vomiting/diarrhea, no paresthesias.  No acute events overnight per nursing staff.  Discharge Exam: Vitals:   01/07/23 0324 01/07/23 0834  BP: (!) 144/70 (!) 150/70  Pulse: 70 75  Resp: 18 18  Temp: 98.2 F (36.8 C) 97.8 F (36.6 C)  SpO2: 94% 95%   Vitals:   01/06/23 2141 01/07/23 0000 01/07/23 0324 01/07/23 0834  BP: (!) 142/80 (!) 150/69 (!) 144/70 (!) 150/70  Pulse: 81 69 70 75  Resp: 18 17 18 18   Temp: 98 F (36.7 C) 98.2 F (36.8 C) 98.2 F (36.8 C) 97.8 F (36.6 C)  TempSrc: Oral Oral Oral Oral  SpO2: 95% 95% 94% 95%  Physical Exam: GEN: NAD, alert and oriented x 3, elderly in appearance HEENT: NCAT, PERRL, EOMI, sclera clear, MMM PULM: CTAB w/o wheezes/crackles, normal respiratory effort, on room air CV: RRR w/o M/G/R GI: abd soft, NTND, NABS, no R/G/M MSK: no peripheral edema, muscle strength globally intact 5/5 bilateral upper/lower extremities NEURO: CN II-XII intact, no focal deficits, sensation to light touch intact PSYCH: normal mood/affect Integumentary: dry/intact, no rashes or wounds    The results of significant diagnostics from this hospitalization (including imaging, microbiology, ancillary and laboratory) are listed below for reference.     Microbiology: No results found for this or any previous visit (from the past 240 hour(s)).   Labs: BNP (last 3 results) No results for input(s): "BNP" in the last 8760 hours. Basic Metabolic Panel: Recent Labs  Lab 01/06/23 1349 01/07/23 0346  NA 129* 133*  K 3.6 2.9*  CL 93* 99  CO2 25 23  GLUCOSE 108* 100*  BUN 17 11  CREATININE 0.75 0.90   CALCIUM 9.3 8.9  MG  --  2.1   Liver Function Tests: No results for input(s): "AST", "ALT", "ALKPHOS", "BILITOT", "PROT", "ALBUMIN" in the last 168 hours. No results for input(s): "LIPASE", "AMYLASE" in the last 168 hours. No results for input(s): "AMMONIA" in the last 168 hours. CBC: Recent Labs  Lab 01/06/23 1443 01/07/23 0346  WBC 9.1 7.6  HGB 15.5* 14.5  HCT 46.0 41.7  MCV 87.3 86.3  PLT 289 276   Cardiac Enzymes: No results for input(s): "CKTOTAL", "CKMB", "CKMBINDEX", "TROPONINI" in the last 168 hours. BNP: Invalid input(s): "POCBNP" CBG: No results for input(s): "GLUCAP" in the last 168 hours. D-Dimer No results for input(s): "DDIMER" in the last 72 hours. Hgb A1c No results for input(s): "HGBA1C" in the last 72 hours. Lipid Profile No results for input(s): "CHOL", "HDL", "LDLCALC", "TRIG", "CHOLHDL", "LDLDIRECT" in the last 72 hours. Thyroid function studies No results for input(s): "TSH", "T4TOTAL", "T3FREE", "THYROIDAB" in the last 72 hours.  Invalid input(s): "FREET3" Anemia work up No results for input(s): "VITAMINB12", "FOLATE", "FERRITIN", "TIBC", "IRON", "RETICCTPCT" in the last 72 hours. Urinalysis    Component Value Date/Time   COLORURINE YELLOW 12/20/2017 2003   APPEARANCEUR CLOUDY (A) 12/20/2017 2003   LABSPEC 1.008 12/20/2017 2003   PHURINE 7.0 12/20/2017 2003   GLUCOSEU NEGATIVE 12/20/2017 2003   HGBUR NEGATIVE 12/20/2017 2003   BILIRUBINUR NEGATIVE 12/20/2017 2003   BILIRUBINUR N 03/08/2017 1453   KETONESUR NEGATIVE 12/20/2017 2003   PROTEINUR NEGATIVE 12/20/2017 2003   UROBILINOGEN 0.2 03/08/2017 1453   UROBILINOGEN 0.2 04/05/2013 1159   NITRITE NEGATIVE 12/20/2017 2003   LEUKOCYTESUR NEGATIVE 12/20/2017 2003   Sepsis Labs Recent Labs  Lab 01/06/23 1443 01/07/23 0346  WBC 9.1 7.6   Microbiology No results found for this or any previous visit (from the past 240 hour(s)).   Time coordinating discharge: Over 30  minutes  SIGNED:   Alvira Philips [place], DO  [facility] 01/07/2023, 10:01 AM

## 2023-01-07 NOTE — Plan of Care (Signed)

## 2023-01-07 NOTE — Consult Note (Signed)
CC: SDH  HPI:     Patient is a 88 y.o. female presented to ED yesterday d/t a fall two days ago w/ ass'd increased balance. She denies any HA or other complaints. Initial CT head showing 1-2cm R SDH. No focal deficits.     Patient Active Problem List   Diagnosis Date Noted   SDH (subdural hematoma) (HCC) 01/06/2023   Pacemaker 09/16/2020   Symptomatic bradycardia 05/26/2020   Sick sinus syndrome (HCC) 04/04/2020   Renal lesion 03/27/2017   Nausea vomiting and diarrhea 03/27/2017   Abdominal pain 03/27/2017   Mixed dyslipidemia 08/10/2016   Multinodular goiter (nontoxic) 08/10/2016   Vitamin D insufficiency 08/10/2016   Mobitz type II atrioventricular block 04/21/2015   Pain in the chest    Chest pain 04/20/2015   GERD (gastroesophageal reflux disease) 04/20/2015   HX: breast cancer 09/10/2013   Diverticulitis 03/31/2013   Vasovagal near syncope 03/31/2013   Visual disturbance, transient 01/16/2013   Hypertension 01/16/2013   Hyperlipidemia 01/16/2013   Cerebrovascular disease 01/16/2013   Essential tremor 01/16/2013   Past Medical History:  Diagnosis Date   Abnormal Pap smear of cervix    Breast cancer (HCC)    Right breast, status post XRT   Cataracts, both eyes    Chest pain 2015   recent episode while in sun- recovered in shade with water   Colon polyp    Depression    Diverticulitis    Diverticulosis    Elevated LDL cholesterol level    Essential tremor    Goiter    multinodular   Hyperlipidemia    Hypertension    Osteopenia 8/97   Spine   Ovarian cyst    found on CT scan   Post-menopausal bleeding 01/2000   Huge polyps exc   Syncope    Vitamin D deficiency     Past Surgical History:  Procedure Laterality Date   Bilateral cateract surgery  2014   BREAST LUMPECTOMY Right 4/96   HYSTEROSCOPY  0/01   LAPAROSCOPIC BILATERAL SALPINGO OOPHERECTOMY Bilateral 01/07/2014   Procedure: LAPAROSCOPIC BILATERAL SALPINGO OOPHORECTOMY;  Surgeon: Annamaria Boots, MD;  Location: [facility];  Service: Gynecology;  Laterality: Bilateral;   PACEMAKER IMPLANT N/A 05/26/2020   Procedure: PACEMAKER IMPLANT;  Surgeon: Lanier Prude, MD;  Location: [facility] INVASIVE CV LAB;  Service: Cardiovascular;  Laterality: N/A;    Medications Prior to Admission  Medication Sig Dispense Refill Last Dose   amLODipine (NORVASC) 5 MG tablet Take 1 tablet (5 mg total) by mouth daily. 90 tablet 3    aspirin 81 MG tablet Take 1 tablet (81 mg total) by mouth daily. 30 tablet     atorvastatin (LIPITOR) 10 MG tablet Take 1 tablet (10 mg total) by mouth every morning. 90 tablet 2    Calcium Citrate-Vitamin D (CITRACAL PETITES/VITAMIN D PO) Take 2 tablets by mouth 2 (two) times daily.      carboxymethylcellulose (REFRESH PLUS) 0.5 % SOLN Place 1 drop into both eyes 2 (two) times daily.      ciprofloxacin (CIPRO) 250 MG tablet Take 250 mg by mouth 2 (two) times daily between meals as needed (UTI).      clonazePAM (KLONOPIN) 0.5 MG tablet Take 0.25 mg by mouth at bedtime.      Flaxseed, Linseed, (SM FLAX SEED OIL) 1000 MG CAPS Take 1,000 mg by mouth every morning.       ibandronate (BONIVA) 150 MG tablet Take 150 mg by mouth every 30 (thirty) days.  indapamide (LOZOL) 1.25 MG tablet Take 1.25 mg by mouth every morning.       Lactobacillus Rhamnosus, GG, (CULTURELLE PO) Take 1 capsule by mouth every morning.       Multiple Vitamin (MULTIVITAMIN WITH MINERALS) TABS tablet Take 1 tablet by mouth every morning.      omeprazole (PRILOSEC) 20 MG capsule Take 20 mg by mouth See admin instructions. Take 20 mg every day for two every four months      polyethylene glycol powder (GLYCOLAX/MIRALAX) powder Take 17 g by mouth daily as needed for moderate constipation or severe constipation.      primidone (MYSOLINE) 50 MG tablet Take 3 tablets (150 mg total) by mouth daily with breakfast.      propranolol (INDERAL) 40 MG tablet Take 1 tablet (40 mg total) by mouth 2 (two) times daily. 180 tablet  3    valsartan (DIOVAN) 320 MG tablet Take 1 tablet (320 mg total) by mouth every morning. 90 tablet 2    Allergies  Allergen Reactions   Clonidine Other (See Comments)    Fatigue drymouth   Hydrocodone-Acetaminophen Nausea Only   Latex Itching and Rash   Flagyl [Metronidazole] Hives, Nausea And Vomiting, Nausea Only and Rash   Penicillins Hives and Rash    Reaction: 2 year ago   Shingrix [Zoster Vac Recomb Adjuvanted] Other (See Comments)    Chills, nausea, diarrhea, and lightheadedness after second dose   Sulfamethoxazole-Trimethoprim Rash    Social History   Tobacco Use   Smoking status: Never   Smokeless tobacco: Never  Substance Use Topics   Alcohol use: No    Alcohol/week: 0.0 standard drinks of alcohol    Family History  Problem Relation Age of Onset   Heart disease Mother        Died at 11 of MI - says doctors thought it was due to mistreatment of thyroid dz in youth   Thyroid disease Mother    Heart attack Mother    Alzheimer's disease Father    Thyroid disease Maternal Grandmother    Stroke Maternal Grandmother      Review of Systems Pertinent items are noted in HPI.  Objective:   Patient Vitals for the past 8 hrs:  BP Temp Temp src Pulse Resp SpO2  01/07/23 0834 (!) 150/70 97.8 F (36.6 C) Oral 75 18 95 %  01/07/23 0324 (!) 144/70 98.2 F (36.8 C) Oral 70 18 94 %   I/O last 3 completed shifts: In: 449.1 [I.V.:449.1] Out: 300 [Urine:300] No intake/output data recorded.  Awake, alert, oriented Speech fluent, appropriate CN grossly intact 4/5 BUE/BLE PERRLA  Assessment:   This is an 88yo female who is not anticoagulated with a subacute R sided SDH w/ mild MLS. CTH stable this AM. Neurologic exam stable.   Plan:   -Pt to f/u outpt for further mgmt. Could consider R MMA embolization vs burr holes vs crani if patient becomes symptomatic. -Recommend repeat CTH in 2 weeks prior to oupt f/u. -Call w/ questions/concerns.

## 2023-01-07 NOTE — Discharge Instructions (Addendum)
Follow-up with primary care physician in 1 week, follow-up with neurosurgery 2 weeks; will need repeat head CT in 2 weeks prior to neurosurgery appointment.  Please hold home aspirin until follow-up with PCP and neurosurgery.  FURTHER DISCHARGE INSTRUCTIONS:   Get Medicines reviewed and adjusted: Please take all your medications with you for your next visit with your Primary MD   Laboratory/radiological data: Please request your Primary MD to go over all hospital tests and procedure/radiological results at the follow up, please ask your Primary MD to get all Hospital records sent to his/her office.   In some cases, they will be blood work, cultures and biopsy results pending at the time of your discharge. Please request that your primary care M.D. goes through all the records of your hospital data and follows up on these results.   Also Note the following: If you experience worsening of your admission symptoms, develop shortness of breath, life threatening emergency, suicidal or homicidal thoughts you must seek medical attention immediately by calling 911 or calling your MD immediately  if symptoms less severe.   You must read complete instructions/literature along with all the possible adverse reactions/side effects for all the Medicines you take and that have been prescribed to you. Take any new Medicines after you have completely understood and accpet all the possible adverse reactions/side effects.    Do not drive when taking Pain medications or sleeping medications (Benzodaizepines)   Do not take more than prescribed Pain, Sleep and Anxiety Medications. It is not advisable to combine anxiety,sleep and pain medications without talking with your primary care practitioner   Special Instructions: If you have smoked or chewed Tobacco  in the last 2 yrs please stop smoking, stop any regular Alcohol  and or any Recreational drug use.   Wear Seat belts while driving.   Please note: You were  cared for by a hospitalist during your hospital stay. Once you are discharged, your primary care physician will handle any further medical issues. Please note that NO REFILLS for any discharge medications will be authorized once you are discharged, as it is imperative that you return to your primary care physician (or establish a relationship with a primary care physician if you do not have one) for your post hospital discharge needs so that they can reassess your need for medications and monitor your lab values.

## 2023-01-07 NOTE — Plan of Care (Signed)

## 2023-01-07 NOTE — Progress Notes (Signed)
Patient discharged,AVS instructions given to son, verbalized understanding. dropped to the main entrance A to son's car

## 2023-01-07 NOTE — TOC CAGE-AID Note (Signed)
Transition of Care [facility]) - CAGE-AID Screening  Patient Details  Name: Michelle Fuller MRN: [id number] Date of Birth: 02-15-1935  Clinical Narrative:  Patient denies any alcohol or substance abuse. No resources needed at this time.  CAGE-AID Screening:    Have You Ever Felt You Ought to Cut Down on Your Drinking or Drug Use?: No Have People Annoyed You By Critizing Your Drinking Or Drug Use?: No Have You Felt Bad Or Guilty About Your Drinking Or Drug Use?: No Have You Ever Had a Drink or Used Drugs First Thing In The Morning to Steady Your Nerves or to Get Rid of a Hangover?: No CAGE-AID Score: 0  Substance Abuse Education Offered: No

## 2023-01-07 NOTE — Evaluation (Signed)
Physical Therapy Evaluation Patient Details Name: Michelle Fuller MRN: [id number] DOB: 03-19-1935 Today's Date: 01/07/2023  History of Present Illness  The pt is an 88 yo female presenting 8/15 after 2 falls at home, one in driveway where she struck her head and one in tub resulting in sacral pain. Work up revealed acute R SDH with 3mm midline shift. PMH includes: HTN, breast cancer s/p radiation therapy, cataracts, depression, HLD, HTN, and pacemaker placement 2022.   Clinical Impression  Pt in bed upon arrival of PT, agreeable to evaluation at this time. Prior to admission the pt was independent with mobility, not using DME, and reports frequent tai chi for exercise. The pt now presents with limitations in functional mobility, strength in LLE, spatial and postural awareness, and dynamic stability due to above dx, and will continue to benefit from skilled PT to address these deficits. The pt required minA to complete bed mobility and CGA to minA at times with gait. The pt had x3 staggering steps to R with turning, x3-4 episodes of scissoring or narrow steps, and consistently is drifting to Left with hallway ambulation. Pt and son educated on fall risk reduction strategies and need for increased supervision and assist at this time. Will benefit from HHPT to improve functional stability, coordination, and improve independence with mobility.   Dynamic Gait Index (DGI): 12/24 (<19 indicates increased risk for falls)    If plan is discharge home, recommend the following: A little help with walking and/or transfers;A little help with bathing/dressing/bathroom;Assistance with cooking/housework;Assist for transportation;Help with stairs or ramp for entrance   Can travel by private vehicle        Equipment Recommendations None recommended by PT  Recommendations for Other Services       Functional Status Assessment Patient has had a recent decline in their functional status and demonstrates the  ability to make significant improvements in function in a reasonable and predictable amount of time.     Precautions / Restrictions Precautions Precautions: Fall Precaution Comments: fell x2 PTA Restrictions Weight Bearing Restrictions: No      Mobility  Bed Mobility Overal bed mobility: Needs Assistance Bed Mobility: Supine to Sit     Supine to sit: Mod assist     General bed mobility comments: pt unable to roll onto L shoulder due to pain and unable to reach bed rails, modA to elevate trunk    Transfers Overall transfer level: Needs assistance Equipment used: None Transfers: Sit to/from Stand Sit to Stand: Contact guard assist           General transfer comment: no LOB with initial stand    Ambulation/Gait Ambulation/Gait assistance: Min assist, Contact guard assist Gait Distance (Feet): 250 Feet Assistive device: None, Rolling walker (2 wheels) Gait Pattern/deviations: Step-through pattern, Decreased stride length, Decreased dorsiflexion - right, Knee flexed in stance - right, Shuffle, Scissoring, Drifts right/left, Narrow base of support, Staggering right Gait velocity: decreased Gait velocity interpretation: <1.31 ft/sec, indicative of household ambulator   General Gait Details: pt with x3 staggering steps to R with turning, x3-4 episodes of scissoring or narrow steps, and consistently is drifting to L. Pt with minimal awareness until directly cued, reports this is close to her baseline.  Stairs Stairs: Yes Stairs assistance: Contact guard assist Stair Management: One rail Right, Alternating pattern, Forwards Number of Stairs: 2 (x3 then x10 step ups each foot) General stair comments: benefits from RUE on rail, minA without rail. discussed installing rail for garage steps  Modified Rankin (  Stroke Patients Only) Modified Rankin (Stroke Patients Only) Pre-Morbid Rankin Score: No significant disability Modified Rankin: Moderately severe disability      Balance Overall balance assessment: Needs assistance Sitting-balance support: No upper extremity supported, Feet supported Sitting balance-Leahy Scale: Good     Standing balance support: No upper extremity supported, During functional activity Standing balance-Leahy Scale: Fair Standing balance comment: poor tolerance for challenge and multiple minor LOB             High level balance activites: Backward walking, Direction changes, Sudden stops, Turns, Head turns High Level Balance Comments: increased challenge with tight turns or quick movements. veering L with gait and needing additional steps to regain balance after scissoring episodes Standardized Balance Assessment Standardized Balance Assessment : Dynamic Gait Index   Dynamic Gait Index Level Surface: Mild Impairment Change in Gait Speed: Mild Impairment Gait with Horizontal Head Turns: Moderate Impairment Gait with Vertical Head Turns: Moderate Impairment Gait and Pivot Turn: Moderate Impairment Step Over Obstacle: Moderate Impairment Step Around Obstacles: Mild Impairment Steps: Mild Impairment Total Score: 12       Pertinent Vitals/Pain Pain Assessment Pain Assessment: Faces Faces Pain Scale: Hurts a little bit Pain Location: L shoulder Pain Descriptors / Indicators: Discomfort, Grimacing Pain Intervention(s): Limited activity within patient's tolerance, Monitored during session, Repositioned    Home Living Family/patient expects to be discharged to:: Private residence Living Arrangements: Alone Available Help at Discharge: Family;Available 24 hours/day Type of Home: House Home Access: Stairs to enter Entrance Stairs-Rails: None Entrance Stairs-Number of Steps: 2 Alternate Level Stairs-Number of Steps: flight of stairs to basement, can live on main level Home Layout: Multi-level;Able to live on main level with bedroom/bathroom Home Equipment: Rolling Walker (2 wheels);Shower seat - built in;Grab bars -  tub/shower;Hand held shower head      Prior Function Prior Level of Function : Independent/Modified Independent;Driving             Mobility Comments: pt does tai chi at home, has arthritis in knees and was told by MD not to walk for exercise. single other fall in July as a result of fainting ADLs Comments: independent     Extremity/Trunk Assessment   Upper Extremity Assessment Upper Extremity Assessment: Right hand dominant;LUE deficits/detail LUE Deficits / Details: limited ROM due to pain, reports sensation intact LUE: Unable to fully assess due to pain;Shoulder pain with ROM LUE Sensation: WNL    Lower Extremity Assessment Lower Extremity Assessment: LLE deficits/detail (bilateral arthritis in knees, grossly 4+/5 strength) LLE Deficits / Details: grossly 4/5 strength, most notable in L ankle. pt needing mulitple trials to generate strength. reports sensation intact, no functional weakness LLE Sensation: WNL LLE Coordination: WNL    Cervical / Trunk Assessment Cervical / Trunk Assessment: Normal  Communication   Communication Communication: No apparent difficulties Cueing Techniques: Verbal cues;Gestural cues  Cognition Arousal: Alert Behavior During Therapy: WFL for tasks assessed/performed Overall Cognitive Status: Impaired/Different from baseline Area of Impairment: Attention, Awareness, Safety/judgement                   Current Attention Level: Focused     Safety/Judgement: Decreased awareness of deficits Awareness: Intellectual   General Comments: pt conversational and able to follow all instructions, poor spatial awareness and postitional awareness.        General Comments General comments (skin integrity, edema, etc.): son present and supportive    Exercises     Assessment/Plan    PT Assessment Patient needs continued PT services  PT  Problem List Decreased strength;Decreased activity tolerance;Decreased balance;Decreased  mobility;Decreased coordination;Decreased safety awareness       PT Treatment Interventions Gait training;DME instruction;Functional mobility training;Stair training;Therapeutic activities;Therapeutic exercise;Balance training;Neuromuscular re-education;Patient/family education    PT Goals (Current goals can be found in the Care Plan section)  Acute Rehab PT Goals Patient Stated Goal: return to independence PT Goal Formulation: With patient Time For Goal Achievement: 01/21/23 Potential to Achieve Goals: Good    Frequency Min 1X/week        AM-PAC PT "6 Clicks" Mobility  Outcome Measure Help needed turning from your back to your side while in a flat bed without using bedrails?: A Little Help needed moving from lying on your back to sitting on the side of a flat bed without using bedrails?: A Little Help needed moving to and from a bed to a chair (including a wheelchair)?: A Little Help needed standing up from a chair using your arms (e.g., wheelchair or bedside chair)?: A Little Help needed to walk in hospital room?: A Little Help needed climbing 3-5 steps with a railing? : A Little 6 Click Score: 18    End of Session Equipment Utilized During Treatment: Gait belt Activity Tolerance: Patient tolerated treatment well Patient left: in chair;with call bell/phone within reach;with family/visitor present Nurse Communication: Mobility status PT Visit Diagnosis: Unsteadiness on feet (R26.81);Repeated falls (R29.6);Muscle weakness (generalized) (M62.81);History of falling (Z91.81)    Time: 1610-9604 PT Time Calculation (min) (ACUTE ONLY): 42 min   Charges:   PT Evaluation $PT Eval Low Complexity: 1 Low PT Treatments $Gait Training: 8-22 mins $Therapeutic Activity: 8-22 mins PT General Charges $$ ACUTE PT VISIT: 1 Visit         Vickki Muff, PT, DPT   Acute Rehabilitation Department Office [phone] Secure Chat Communication Preferred   Ronnie Derby 01/07/2023, 9:57  AM

## 2023-01-07 NOTE — TOC Transition Note (Signed)
Transition of Care [facility]) - CM/SW Discharge Note   Patient Details  Name: Michelle Fuller MRN: [id number] Date of Birth: 1935-05-21  Transition of Care [facility]) CM/SW Contact:  Kermit Balo, RN Phone Number: 01/07/2023, 10:38 AM   Clinical Narrative:    Pt is discharging home with home health services through [facility]. CM provided choice and she chose [facility]. Information on the AVS. The home health agency will contact her for the first home visit. No new DME needs. She has a walker and shower seat at home. She drives self  but family can assist. She manages her own medications and denies any issues. Family to provide some supervision through the weekend. They will also transport home.   Final next level of care: Home w Home Health Services Barriers to Discharge: No Barriers Identified   Patient Goals and CMS Choice CMS Medicare.gov Compare Post Acute Care list provided to:: Patient Choice offered to / list presented to : Patient  Discharge Placement                         Discharge Plan and Services Additional resources added to the After Visit Summary for                            [facility] Arranged: PT [facility] Agency: [facility] Date [facility] Agency Contacted: 01/07/23   Representative spoke with at [facility] Agency: Haywood Lasso  Social Determinants of Health (SDOH) Interventions SDOH Screenings   Food Insecurity: No Food Insecurity (01/07/2023)  Housing: Low Risk  (01/07/2023)  Transportation Needs: No Transportation Needs (01/07/2023)  Utilities: Not At Risk (01/07/2023)  Tobacco Use: Low Risk  (12/04/2022)     Readmission Risk Interventions     No data to display

## 2023-01-08 DIAGNOSIS — D649 Anemia, unspecified: Secondary | ICD-10-CM | POA: Diagnosis not present

## 2023-01-08 DIAGNOSIS — N390 Urinary tract infection, site not specified: Secondary | ICD-10-CM | POA: Diagnosis not present

## 2023-01-08 DIAGNOSIS — F32A Depression, unspecified: Secondary | ICD-10-CM | POA: Diagnosis not present

## 2023-01-08 DIAGNOSIS — I495 Sick sinus syndrome: Secondary | ICD-10-CM | POA: Diagnosis not present

## 2023-01-08 DIAGNOSIS — I10 Essential (primary) hypertension: Secondary | ICD-10-CM | POA: Diagnosis not present

## 2023-01-08 DIAGNOSIS — H539 Unspecified visual disturbance: Secondary | ICD-10-CM | POA: Diagnosis not present

## 2023-01-08 DIAGNOSIS — M160 Bilateral primary osteoarthritis of hip: Secondary | ICD-10-CM | POA: Diagnosis not present

## 2023-01-08 DIAGNOSIS — S065X0D Traumatic subdural hemorrhage without loss of consciousness, subsequent encounter: Secondary | ICD-10-CM | POA: Diagnosis not present

## 2023-01-08 DIAGNOSIS — I441 Atrioventricular block, second degree: Secondary | ICD-10-CM | POA: Diagnosis not present

## 2023-01-14 DIAGNOSIS — F32A Depression, unspecified: Secondary | ICD-10-CM | POA: Diagnosis not present

## 2023-01-14 DIAGNOSIS — H539 Unspecified visual disturbance: Secondary | ICD-10-CM | POA: Diagnosis not present

## 2023-01-14 DIAGNOSIS — I495 Sick sinus syndrome: Secondary | ICD-10-CM | POA: Diagnosis not present

## 2023-01-14 DIAGNOSIS — M160 Bilateral primary osteoarthritis of hip: Secondary | ICD-10-CM | POA: Diagnosis not present

## 2023-01-14 DIAGNOSIS — N390 Urinary tract infection, site not specified: Secondary | ICD-10-CM | POA: Diagnosis not present

## 2023-01-14 DIAGNOSIS — S065X0D Traumatic subdural hemorrhage without loss of consciousness, subsequent encounter: Secondary | ICD-10-CM | POA: Diagnosis not present

## 2023-01-14 DIAGNOSIS — D649 Anemia, unspecified: Secondary | ICD-10-CM | POA: Diagnosis not present

## 2023-01-14 DIAGNOSIS — I441 Atrioventricular block, second degree: Secondary | ICD-10-CM | POA: Diagnosis not present

## 2023-01-14 DIAGNOSIS — I10 Essential (primary) hypertension: Secondary | ICD-10-CM | POA: Diagnosis not present

## 2023-01-17 DIAGNOSIS — I441 Atrioventricular block, second degree: Secondary | ICD-10-CM | POA: Diagnosis not present

## 2023-01-17 DIAGNOSIS — M545 Low back pain, unspecified: Secondary | ICD-10-CM | POA: Diagnosis not present

## 2023-01-17 DIAGNOSIS — D649 Anemia, unspecified: Secondary | ICD-10-CM | POA: Diagnosis not present

## 2023-01-17 DIAGNOSIS — I10 Essential (primary) hypertension: Secondary | ICD-10-CM | POA: Diagnosis not present

## 2023-01-17 DIAGNOSIS — S065X0D Traumatic subdural hemorrhage without loss of consciousness, subsequent encounter: Secondary | ICD-10-CM | POA: Diagnosis not present

## 2023-01-17 DIAGNOSIS — M810 Age-related osteoporosis without current pathological fracture: Secondary | ICD-10-CM | POA: Diagnosis not present

## 2023-01-17 DIAGNOSIS — G8929 Other chronic pain: Secondary | ICD-10-CM | POA: Diagnosis not present

## 2023-01-17 DIAGNOSIS — G250 Essential tremor: Secondary | ICD-10-CM | POA: Diagnosis not present

## 2023-01-17 DIAGNOSIS — M160 Bilateral primary osteoarthritis of hip: Secondary | ICD-10-CM | POA: Diagnosis not present

## 2023-01-18 ENCOUNTER — Other Ambulatory Visit (HOSPITAL_COMMUNITY): Payer: Self-pay | Admitting: Surgery

## 2023-01-18 DIAGNOSIS — I441 Atrioventricular block, second degree: Secondary | ICD-10-CM | POA: Diagnosis not present

## 2023-01-18 DIAGNOSIS — N390 Urinary tract infection, site not specified: Secondary | ICD-10-CM | POA: Diagnosis not present

## 2023-01-18 DIAGNOSIS — F32A Depression, unspecified: Secondary | ICD-10-CM | POA: Diagnosis not present

## 2023-01-18 DIAGNOSIS — M160 Bilateral primary osteoarthritis of hip: Secondary | ICD-10-CM | POA: Diagnosis not present

## 2023-01-18 DIAGNOSIS — I10 Essential (primary) hypertension: Secondary | ICD-10-CM | POA: Diagnosis not present

## 2023-01-18 DIAGNOSIS — D649 Anemia, unspecified: Secondary | ICD-10-CM | POA: Diagnosis not present

## 2023-01-18 DIAGNOSIS — I6200 Nontraumatic subdural hemorrhage, unspecified: Secondary | ICD-10-CM

## 2023-01-18 DIAGNOSIS — I495 Sick sinus syndrome: Secondary | ICD-10-CM | POA: Diagnosis not present

## 2023-01-18 DIAGNOSIS — S065X0D Traumatic subdural hemorrhage without loss of consciousness, subsequent encounter: Secondary | ICD-10-CM | POA: Diagnosis not present

## 2023-01-18 DIAGNOSIS — H539 Unspecified visual disturbance: Secondary | ICD-10-CM | POA: Diagnosis not present

## 2023-01-19 DIAGNOSIS — G4700 Insomnia, unspecified: Secondary | ICD-10-CM | POA: Diagnosis not present

## 2023-01-19 DIAGNOSIS — I495 Sick sinus syndrome: Secondary | ICD-10-CM | POA: Diagnosis not present

## 2023-01-19 DIAGNOSIS — E871 Hypo-osmolality and hyponatremia: Secondary | ICD-10-CM | POA: Diagnosis not present

## 2023-01-19 DIAGNOSIS — R2689 Other abnormalities of gait and mobility: Secondary | ICD-10-CM | POA: Diagnosis not present

## 2023-01-19 DIAGNOSIS — R531 Weakness: Secondary | ICD-10-CM | POA: Diagnosis not present

## 2023-01-19 DIAGNOSIS — S065XAA Traumatic subdural hemorrhage with loss of consciousness status unknown, initial encounter: Secondary | ICD-10-CM | POA: Diagnosis not present

## 2023-01-19 DIAGNOSIS — R5381 Other malaise: Secondary | ICD-10-CM | POA: Diagnosis not present

## 2023-01-19 DIAGNOSIS — R3589 Other polyuria: Secondary | ICD-10-CM | POA: Diagnosis not present

## 2023-01-19 DIAGNOSIS — R5383 Other fatigue: Secondary | ICD-10-CM | POA: Diagnosis not present

## 2023-01-19 DIAGNOSIS — M2681 Anterior soft tissue impingement: Secondary | ICD-10-CM | POA: Diagnosis not present

## 2023-01-19 DIAGNOSIS — R296 Repeated falls: Secondary | ICD-10-CM | POA: Diagnosis not present

## 2023-01-19 DIAGNOSIS — Z111 Encounter for screening for respiratory tuberculosis: Secondary | ICD-10-CM | POA: Diagnosis not present

## 2023-01-21 ENCOUNTER — Ambulatory Visit (HOSPITAL_COMMUNITY)
Admission: RE | Admit: 2023-01-21 | Discharge: 2023-01-21 | Disposition: A | Discharge status: Home / Self Care | Payer: Medicare PPO | Source: Ambulatory Visit | Attending: Surgery | Admitting: Surgery

## 2023-01-21 DIAGNOSIS — I6200 Nontraumatic subdural hemorrhage, unspecified: Secondary | ICD-10-CM | POA: Diagnosis not present

## 2023-01-22 ENCOUNTER — Emergency Department (HOSPITAL_COMMUNITY)
Admission: EM | Admit: 2023-01-22 | Discharge: 2023-01-22 | Disposition: A | Discharge status: Home / Self Care | Payer: Medicare PPO | Source: Home / Self Care | Attending: Emergency Medicine | Admitting: Emergency Medicine

## 2023-01-22 ENCOUNTER — Other Ambulatory Visit: Payer: Self-pay

## 2023-01-22 ENCOUNTER — Emergency Department (HOSPITAL_COMMUNITY): Payer: Medicare PPO

## 2023-01-22 ENCOUNTER — Encounter (HOSPITAL_COMMUNITY): Payer: Self-pay

## 2023-01-22 DIAGNOSIS — I6200 Nontraumatic subdural hemorrhage, unspecified: Secondary | ICD-10-CM | POA: Diagnosis not present

## 2023-01-22 DIAGNOSIS — S0990XA Unspecified injury of head, initial encounter: Secondary | ICD-10-CM | POA: Diagnosis not present

## 2023-01-22 DIAGNOSIS — I6203 Nontraumatic chronic subdural hemorrhage: Secondary | ICD-10-CM | POA: Diagnosis not present

## 2023-01-22 DIAGNOSIS — W1830XA Fall on same level, unspecified, initial encounter: Secondary | ICD-10-CM | POA: Insufficient documentation

## 2023-01-22 DIAGNOSIS — Z91040 Latex allergy status: Secondary | ICD-10-CM | POA: Insufficient documentation

## 2023-01-22 DIAGNOSIS — W19XXXA Unspecified fall, initial encounter: Secondary | ICD-10-CM

## 2023-01-22 DIAGNOSIS — S065X0A Traumatic subdural hemorrhage without loss of consciousness, initial encounter: Secondary | ICD-10-CM | POA: Diagnosis not present

## 2023-01-22 NOTE — ED Provider Notes (Signed)
I had consulted social work because of patient having increased falls recently.  She is supposed to be transferred to independent living facility soon.  Family wants to try and see if they can reduce risk of fall until she is transferred there later this week.  My request is that we reduce unnecessary ambulation of the patient to reduce risk of fall.  Social work has seen the patient.  I have requested bedside commode, walker.  The patient is confined to one room and has generalized weakness and worsening balance issues, necessitating a bedside commode to prevent falls.   Derwood Kaplan, MD 01/22/23 [phone]

## 2023-01-22 NOTE — ED Notes (Addendum)
RN left voicemail for case Production designer, theatre/television/film.  Pt daughter is inquiring a sturdy walker and traveling chair for pt.   Daughter is concerned about daughter staying at home by herself. RN educated daughter to make sure rugs and cords are removed from home to prevent future falls. RN recommended pt to stop drinking at 19:30 to prevent having to wake up in the middle of night.   TOC was contacted to provide pt with a sturdy walker and BSC. RN informed daughter to put all toiletries items at beside to reduce pt traveling to search for items needed after using BSC. Daughter verbalized understanding.

## 2023-01-22 NOTE — Care Management (Cosign Needed)
The patient is confined to one room and  has generalized weakness necessitating a bedside commode

## 2023-01-22 NOTE — ED Notes (Signed)
RN updated pt on tx plan and assisted her to the restroom.

## 2023-01-22 NOTE — ED Triage Notes (Addendum)
Pt states got up to use the bathroom and was using the walker an the walker and pt fell  over and she hit her head on the wall at 0100. Pt denies blood thinners. Pt denies LOC. Pt c/o HA. Pt denies dizziness or vision changes.

## 2023-01-22 NOTE — Discharge Instructions (Addendum)
You are seen in the emergency room after you had a fall. The workup in the emergency room is reassuring. CT scan today does not reveal any worsening of the previous brain bleed or any new bleed.  Symptoms of subdural hematoma include: Headache. Changes in vision, such as double vision or loss of vision. Changes in speech or trouble understanding what people say. Loss of balance or trouble walking. Weakness, numbness, or tingling in the arms or legs, especially on one side of the body. Seizures. Change in personality. Increased sleepiness. Memory loss.  Please be very careful when ambulating to prevent additional falls.

## 2023-01-22 NOTE — Care Management (Addendum)
Patient presents with fall and hit her head. She has home heath PT and is planning on going into a facility next week. She currently has a Museum/gallery exhibitions officer and would ike a walker without wheels. She also would like a BSC. Contacted [employer] for both. [employer] messaged back and stated that the walker would be out of pocket as she already has one.  1300 Spoke to patients daughter. Discussed fall prevention ( they have already done this) PT comes out once a week. They have private caregiver in the morning. They are fine with paying out of pocket. They do not feel that she needs a Wheelchair, she will be going to [facility] assisted living on Thursday,. [employer] messaged regarding they are agreeable to out of pocket cost.  1400 After messages the narrative is not signed yet. This is required to process the DME. Spoke to Dr Rhunette Croft. He wi sign asap for processing.

## 2023-01-22 NOTE — ED Provider Notes (Signed)
[place] EMERGENCY DEPARTMENT AT [facility] Provider Note   CSN: [id number] Arrival date & time: 01/22/23  4098     History  Chief Complaint  Patient presents with   Michelle Fuller    MARRIETTA Fuller is a 88 y.o. female.  HPI     88 year old female comes in with chief complaint of mechanical fall. Patient accompanied by daughter, who provides substantial part of the history.  According to the patient, she had a mechanical fall at 1 AM, when she had gone to the bathroom.  She fell in her bedroom, striking her head onto the wall.  It took a lot of effort, but she was able to get herself up.  This morning she mention her symptoms to her family and they brought her to the ER.  According to the patient's daughter, on August 15 patient had a mechanical fall and was found to have intracranial bleed.  She actually had a repeat CT scan yesterday.  Patient currently lives by herself, but is going to transition to independent living facility.  Since the fall, patient has been having increasing weakness and sleepiness, and possibly more balance issues.  Home Medications Prior to Admission medications   Medication Sig Start Date End Date Taking? Authorizing Provider  amLODipine (NORVASC) 5 MG tablet Take 1 tablet (5 mg total) by mouth daily. 04/29/22   Meriam Sprague, MD  atorvastatin (LIPITOR) 10 MG tablet Take 1 tablet (10 mg total) by mouth every morning. 04/29/22   Meriam Sprague, MD  Calcium Citrate-Vitamin D (CITRACAL PETITES/VITAMIN D PO) Take 2 tablets by mouth 2 (two) times daily.    [provider]  carboxymethylcellulose (REFRESH PLUS) 0.5 % SOLN Place 1 drop into both eyes 2 (two) times daily.    [provider]  ciprofloxacin (CIPRO) 250 MG tablet Take 250 mg by mouth 2 (two) times daily between meals as needed (UTI). 02/25/17   [provider]  clonazePAM (KLONOPIN) 0.5 MG tablet Take 0.25 mg by mouth at bedtime. 09/09/14   [provider]  Flaxseed, Linseed, (SM FLAX SEED OIL) 1000 MG CAPS Take 1,000 mg by mouth every morning.     [provider]  HYDROcodone-acetaminophen (NORCO/VICODIN) 5-325 MG tablet Take 1 tablet by mouth every 6 (six) hours as needed for moderate pain. 01/07/23 01/07/24  [place], Alvira Philips, DO  ibandronate (BONIVA) 150 MG tablet Take 150 mg by mouth every 30 (thirty) days.    [provider]  indapamide (LOZOL) 1.25 MG tablet Take 1.25 mg by mouth every morning.     [provider]  Lactobacillus Rhamnosus, GG, (CULTURELLE PO) Take 1 capsule by mouth every morning.     [provider]  Multiple Vitamin (MULTIVITAMIN WITH MINERALS) TABS tablet Take 1 tablet by mouth every morning.    [provider]  omeprazole (PRILOSEC) 20 MG capsule Take 20 mg by mouth See admin instructions. Take 20 mg every day for two every four months    [provider]  ondansetron (ZOFRAN) 4 MG tablet Take 1 tablet (4 mg total) by mouth every 8 (eight) hours as needed for nausea or vomiting. 01/07/23 01/07/24  [place], Alvira Philips, DO  oxyCODONE (ROXICODONE) 5 MG immediate release tablet Take 1 tablet (5 mg total) by mouth every 6 (six) hours as needed for moderate pain. 01/07/23 01/07/24  [place], Alvira Philips, DO  polyethylene glycol powder (GLYCOLAX/MIRALAX) powder Take 17 g by mouth daily as needed for moderate constipation or severe  constipation.    [provider]  primidone (MYSOLINE) 50 MG tablet Take 3 tablets (150 mg total) by mouth daily with breakfast. 01/08/14   Jerene Bears, MD  propranolol (INDERAL) 40 MG tablet Take 1 tablet (40 mg total) by mouth 2 (two) times daily. 04/29/22   Meriam Sprague, MD  valsartan (DIOVAN) 320 MG tablet Take 1 tablet (320 mg total) by mouth every morning. 04/29/22   Meriam Sprague, MD      Allergies    Clonidine, Hydrocodone-acetaminophen, Latex, Flagyl [metronidazole], Penicillins, Shingrix [zoster vac recomb adjuvanted],  and Sulfamethoxazole-trimethoprim    Review of Systems   Review of Systems  All other systems reviewed and are negative.   Physical Exam Updated Vital Signs BP (!) 165/73   Pulse 72   Temp 98.1 F (36.7 C) (Oral)   Resp 16   Ht 5\' 4"  (1.626 m)   Wt 62.9 kg   LMP 05/25/1999   SpO2 99%   BMI 23.80 kg/m  Physical Exam Constitutional:      Appearance: She is well-developed.  HENT:     Head: Normocephalic and atraumatic.  Eyes:     Conjunctiva/sclera: Conjunctivae normal.     Pupils: Pupils are equal, round, and reactive to light.  Neck:     Comments: No midline c-spine tenderness, pt able to turn head to 45 degrees bilaterally without any pain and able to flex neck to the chest and extend without any pain or neurologic symptoms.  Cardiovascular:     Rate and Rhythm: Normal rate and regular rhythm.     Heart sounds: Normal heart sounds.  Pulmonary:     Effort: Pulmonary effort is normal. No respiratory distress.     Breath sounds: Normal breath sounds.  Abdominal:     General: Bowel sounds are normal. There is no distension.     Palpations: Abdomen is soft.     Tenderness: There is no abdominal tenderness. There is no guarding or rebound.  Musculoskeletal:     Cervical back: Normal range of motion and neck supple.     Comments: Head to toe evaluation shows no hematoma, bleeding of the scalp, no facial abrasions, no spine step offs, crepitus of the chest or neck, no tenderness to palpation of the bilateral upper and lower extremities, no gross deformities, no chest tenderness, no pelvic pain.   Skin:    General: Skin is warm and dry.  Neurological:     Mental Status: She is alert and oriented to person, place, and time.     Cranial Nerves: No cranial nerve deficit.     Motor: No weakness.     ED Results / Procedures / Treatments   Labs (all labs ordered are listed, but only abnormal results are displayed) Labs Reviewed - No data to  display  EKG None  Radiology CT Head Wo Contrast  Result Date: 01/22/2023 CLINICAL DATA:  Head trauma.  Follow-up subdural hematoma. EXAM: CT HEAD WITHOUT CONTRAST TECHNIQUE: Contiguous axial images were obtained from the base of the skull through the vertex without intravenous contrast. RADIATION DOSE REDUCTION: This exam was performed according to the departmental dose-optimization program which includes automated exposure control, adjustment of the mA and/or kV according to patient size and/or use of iterative reconstruction technique. COMPARISON:  01/07/2023 FINDINGS: Brain: Right subdural hematoma is again noted. This is similar in density when compared with the previous exam and is high to isointense. On today's study using the same measuring technique as before  this measures 1.9 cm in thickness along the right frontal convexity. Previously this measured 2.1 cm in thickness. The brain is atrophic. Leftward midline shift of 4 mm appears unchanged. Chronic left cerebellar infarct. No hydrocephalus. Vascular: No hyperdense vessel or unexpected calcification. Skull: Normal. Negative for fracture or focal lesion. Sinuses/Orbits: No acute finding. Other: None. IMPRESSION: 1. No significant interval change in size or density of right subdural hematoma. 2. Stable leftward midline shift of 4 mm. 3. Chronic left cerebellar infarct. 4. Atrophic brain. Electronically Signed   By: Signa Kell M.D.   On: 01/22/2023 11:26    Procedures Procedures    Medications Ordered in ED Medications - No data to display  ED Course/ Medical Decision Making/ A&P                                 Medical Decision Making Amount and/or Complexity of Data Reviewed Radiology: ordered.   88 year old female with known history of subdural hematoma comes in with chief complaint of mechanical fall.  History provided by patient.  Collateral history provided by patient's daughter.  I have also reviewed patient's recent  discharge summary and previous CT scans of the brain for this encounter.  It appears that patient had a mechanical fall again last night.  Overall, history and exam are reassuring.  Patient however has worsening weakness and balance issues since she was diagnosed with subdural.  We do not have any focal neurodeficits at this time.  Differential diagnosis considered for this patient includes - Mechanical falls due to balance issues - ICH - Fractures - Contusions - Soft tissue injury  Based on initial assessment, plan is to get CT scan of the brain only.  No indication for blood work.  No clinical concerns for UTI.  11:43 AM I have independently interpreted patient's CT scan of the brain.  There is no evidence of acute brain bleed.  I discussed the case with radiologist, indicated that there is no change in the chronic subdural hematoma either.  The patient appears reasonably screened and/or stabilized for discharge and I doubt any other medical condition or other [facility] requiring further screening, evaluation, or treatment in the ED at this time prior to discharge.   Results from the ER workup discussed with the patient face to face and all questions answered to the best of my ability. The patient is safe for discharge with strict return precautions.  I did note on CT scan on 8-15, the report indicates that patient has chronic cerebellar infarcts.  Daughter indicates that patient had a fall in [place] sometime in August as well.  She has not had any recent CT scans prior to these recent episodes of falls.  Questioning the significance of the cerebellar infarct at this time. I have discussed this finding with the family.  MRI from 2014 was reassuring.  Plan is for PCP follow-up.  Patient will be getting PT as soon as she is enrolled in the independent living facility later this week.  Final Clinical Impression(s) / ED Diagnoses Final diagnoses:  Fall, initial encounter  Chronic subdural  hematoma [facility])    Rx / DC Orders ED Discharge Orders     None         Derwood Kaplan, MD 01/22/23 1145

## 2023-01-23 DIAGNOSIS — R531 Weakness: Secondary | ICD-10-CM | POA: Diagnosis not present

## 2023-01-25 DIAGNOSIS — I10 Essential (primary) hypertension: Secondary | ICD-10-CM | POA: Diagnosis not present

## 2023-01-25 DIAGNOSIS — D649 Anemia, unspecified: Secondary | ICD-10-CM | POA: Diagnosis not present

## 2023-01-25 DIAGNOSIS — H539 Unspecified visual disturbance: Secondary | ICD-10-CM | POA: Diagnosis not present

## 2023-01-25 DIAGNOSIS — N390 Urinary tract infection, site not specified: Secondary | ICD-10-CM | POA: Diagnosis not present

## 2023-01-25 DIAGNOSIS — M160 Bilateral primary osteoarthritis of hip: Secondary | ICD-10-CM | POA: Diagnosis not present

## 2023-01-25 DIAGNOSIS — I441 Atrioventricular block, second degree: Secondary | ICD-10-CM | POA: Diagnosis not present

## 2023-01-25 DIAGNOSIS — I495 Sick sinus syndrome: Secondary | ICD-10-CM | POA: Diagnosis not present

## 2023-01-25 DIAGNOSIS — F32A Depression, unspecified: Secondary | ICD-10-CM | POA: Diagnosis not present

## 2023-01-25 DIAGNOSIS — S065X0D Traumatic subdural hemorrhage without loss of consciousness, subsequent encounter: Secondary | ICD-10-CM | POA: Diagnosis not present

## 2023-01-26 DIAGNOSIS — I6200 Nontraumatic subdural hemorrhage, unspecified: Secondary | ICD-10-CM | POA: Diagnosis not present

## 2023-01-31 ENCOUNTER — Other Ambulatory Visit: Payer: Self-pay | Admitting: Neurosurgery

## 2023-01-31 DIAGNOSIS — I10 Essential (primary) hypertension: Secondary | ICD-10-CM | POA: Diagnosis not present

## 2023-01-31 DIAGNOSIS — M160 Bilateral primary osteoarthritis of hip: Secondary | ICD-10-CM | POA: Diagnosis not present

## 2023-01-31 DIAGNOSIS — I495 Sick sinus syndrome: Secondary | ICD-10-CM | POA: Diagnosis not present

## 2023-01-31 DIAGNOSIS — D649 Anemia, unspecified: Secondary | ICD-10-CM | POA: Diagnosis not present

## 2023-01-31 DIAGNOSIS — F32A Depression, unspecified: Secondary | ICD-10-CM | POA: Diagnosis not present

## 2023-01-31 DIAGNOSIS — S065X0D Traumatic subdural hemorrhage without loss of consciousness, subsequent encounter: Secondary | ICD-10-CM | POA: Diagnosis not present

## 2023-01-31 DIAGNOSIS — H539 Unspecified visual disturbance: Secondary | ICD-10-CM | POA: Diagnosis not present

## 2023-01-31 DIAGNOSIS — N390 Urinary tract infection, site not specified: Secondary | ICD-10-CM | POA: Diagnosis not present

## 2023-01-31 DIAGNOSIS — I441 Atrioventricular block, second degree: Secondary | ICD-10-CM | POA: Diagnosis not present

## 2023-02-01 DIAGNOSIS — I10 Essential (primary) hypertension: Secondary | ICD-10-CM | POA: Diagnosis not present

## 2023-02-01 DIAGNOSIS — I441 Atrioventricular block, second degree: Secondary | ICD-10-CM | POA: Diagnosis not present

## 2023-02-01 DIAGNOSIS — S065X0D Traumatic subdural hemorrhage without loss of consciousness, subsequent encounter: Secondary | ICD-10-CM | POA: Diagnosis not present

## 2023-02-01 DIAGNOSIS — D649 Anemia, unspecified: Secondary | ICD-10-CM | POA: Diagnosis not present

## 2023-02-01 DIAGNOSIS — H539 Unspecified visual disturbance: Secondary | ICD-10-CM | POA: Diagnosis not present

## 2023-02-01 DIAGNOSIS — F32A Depression, unspecified: Secondary | ICD-10-CM | POA: Diagnosis not present

## 2023-02-01 DIAGNOSIS — I495 Sick sinus syndrome: Secondary | ICD-10-CM | POA: Diagnosis not present

## 2023-02-01 DIAGNOSIS — N390 Urinary tract infection, site not specified: Secondary | ICD-10-CM | POA: Diagnosis not present

## 2023-02-01 DIAGNOSIS — M160 Bilateral primary osteoarthritis of hip: Secondary | ICD-10-CM | POA: Diagnosis not present

## 2023-02-02 ENCOUNTER — Encounter (HOSPITAL_COMMUNITY): Payer: Self-pay | Admitting: *Deleted

## 2023-02-02 ENCOUNTER — Other Ambulatory Visit: Payer: Self-pay

## 2023-02-02 ENCOUNTER — Encounter: Payer: Self-pay | Admitting: Cardiology

## 2023-02-02 NOTE — Anesthesia Preprocedure Evaluation (Signed)
Anesthesia Evaluation  Patient identified by MRN, date of birth, ID band Patient awake    Reviewed: Allergy & Precautions, H&P , NPO status , Patient's Chart, lab work & pertinent test results  Airway Mallampati: II  TM Distance: >3 FB Neck ROM: Full    Dental no notable dental hx.    Pulmonary neg pulmonary ROS   Pulmonary exam normal breath sounds clear to auscultation       Cardiovascular hypertension, Normal cardiovascular exam+ dysrhythmias + pacemaker  Rhythm:Regular Rate:Normal     Neuro/Psych  PSYCHIATRIC DISORDERS  Depression    negative neurological ROS     GI/Hepatic Neg liver ROS,GERD  ,,  Endo/Other  negative endocrine ROS    Renal/GU Renal disease  negative genitourinary   Musculoskeletal  (+) Arthritis ,    Abdominal   Peds negative pediatric ROS (+)  Hematology negative hematology ROS (+)   Anesthesia Other Findings   Reproductive/Obstetrics negative OB ROS                              Anesthesia Physical Anesthesia Plan  ASA: 3  Anesthesia Plan: General   Post-op Pain Management:    Induction: Intravenous  PONV Risk Score and Plan: Ondansetron and Dexamethasone  Airway Management Planned: Oral ETT  Additional Equipment:   Intra-op Plan:   Post-operative Plan: Extubation in OR  Informed Consent: I have reviewed the patients History and Physical, chart, labs and discussed the procedure including the risks, benefits and alternatives for the proposed anesthesia with the patient or authorized representative who has indicated his/her understanding and acceptance.     Dental advisory given  Plan Discussed with: CRNA  Anesthesia Plan Comments: (PAT note written 02/02/2023 by Shonna Chock, PA-C:   Patient is an 88 year old female scheduled for the above procedure. S/p mechanical fall in her driveway on 01/06/23. She struck her head but denied LOC. She  slipped again in the bathroom and came to the ED for further evaluation. Head CT showed ~ 2 cm right SDH with mass effect upon the underlying brain parenchyma with partial effacement of the right lateral ventricle and 3 mm leftward midline shift, no calvarial fracture, mild chronic small vessel ischemic changes, and chronic infarcts within the bilateral cerebellar hemispheres.  Neurosurgery was consulted with recommendation for admission, keep SBP < 160, frequent neuro checks, and serial head CT imaging. Imaging remained stable, and she had no significant neurological defects, so she was discharged home on 01/07/23.  2 week out-patient neurosurgery follow-up planned, and developed symptoms would consider right MMA embolization versus burr holes versus craniotomy.    01/22/23 head CT was stable at ED visit following another mechanical fall. At 01/26/23 follow-up with Dr. Maisie Fus, he wrote, "subacute to chronic right subdural hematoma which has persisted and has been accompanied by significant functional decline.I had a long discussion with the patient and her daughter. I discussed the natural history of chronic subdural hematomas. In her case, as it appears to be symptomatic with her symptoms not improving and repeat CT head not showing it is probably resolving, I think her ultimate functional outcome would be maximized with a burr hole drainage of the subdural hematoma."   Other history includes never smoker, HTN, HLD, essential tremor, syncope (03/2013), symptomatic 2:1 AV block (s/p St. Jude/Abbott 2272 Assurity MRI dual chamber PPM 05/26/20), multinodular goiter, right breast cancer (s/p lumpectomy 1996, chemoradiation), BSO (01/07/14, left mucinous cystadenoma, right inclusion cysts).   )  Anesthesia Quick Evaluation

## 2023-02-02 NOTE — Telephone Encounter (Signed)
Erroneous encounter

## 2023-02-02 NOTE — Progress Notes (Signed)
Notified Suella Broad, [employer] rep., of pt's surgery tomorrow. He will come around 12pm to interrogate device. Message sent to [facility] requesting device orders.

## 2023-02-02 NOTE — Progress Notes (Signed)
Anesthesia Chart Review: Michelle Fuller  Case: [id number] Date/Time: 02/03/23 1254   Procedure: Ines Bloomer HOLES (Right)   Anesthesia type: General   Pre-op diagnosis: SDH   [facility]: [facility] ROOM 18 / [facility] OR   Surgeons: Bedelia Person, MD       DISCUSSION: Patient is an 88 year old female scheduled for the above procedure. S/p mechanical fall in her driveway on 01/06/23. She struck her head but denied LOC. She slipped again in the bathroom and came to the ED for further evaluation. Head CT showed ~ 2 cm right SDH with mass effect upon the underlying brain parenchyma with partial effacement of the right lateral ventricle and 3 mm leftward midline shift, no calvarial fracture, mild chronic small vessel ischemic changes, and chronic infarcts within the bilateral cerebellar hemispheres.  Neurosurgery was consulted with recommendation for admission, keep SBP < 160, frequent neuro checks, and serial head CT imaging. Imaging remained stable, and she had no significant neurological defects, so she was discharged home on 01/07/23.  2 week out-patient neurosurgery follow-up planned, and developed symptoms would consider right MMA embolization versus burr holes versus craniotomy.   01/22/23 head CT was stable at ED visit following another mechanical fall. At 01/26/23 follow-up with Dr. Maisie Fus, he wrote, "subacute to chronic right subdural hematoma which has persisted and has been accompanied by significant functional decline.I had a long discussion with the patient and her daughter. I discussed the natural history of chronic subdural hematomas. In her case, as it appears to be symptomatic with her symptoms not improving and repeat CT head not showing it is probably resolving, I think her ultimate functional outcome would be maximized with a burr hole drainage of the subdural hematoma."  Other history includes never smoker, HTN, HLD, essential tremor, syncope (03/2013), symptomatic 2:1 AV block (s/p St. Jude/Abbott 2272  Assurity MRI dual chamber PPM 05/26/20), multinodular goiter, right breast cancer (s/p lumpectomy 1996, chemoradiation), BSO (01/07/14, left mucinous cystadenoma, right inclusion cysts).  Last visit with EP Dr. Lalla Brothers on 06/16/22. PPM functioning appropriately. Follow-up in 1 year recommended.   Perioperative PPM recommendations from EP requested, but is till pending.   Anesthesia team to evaluate on the day of surgery.    VS:  BP Readings from Last 3 Encounters:  01/22/23 (!) 147/95  01/07/23 (!) 150/70  12/08/22 (!) 143/86   Pulse Readings from Last 3 Encounters:  01/22/23 87  01/07/23 75  12/08/22 79     PROVIDERS: Charlane Ferretti, DO is PCP  Steffanie Dunn, MD is EP cardiologist. She was also followed by primary cardiologist Laurance Flatten, MD, last 04/29/22.    LABS: Most recent lab results in [facility] include: Lab Results  Component Value Date   WBC 7.6 01/07/2023   HGB 14.5 01/07/2023   HCT 41.7 01/07/2023   PLT 276 01/07/2023   GLUCOSE 100 (H) 01/07/2023   NA 133 (L) 01/07/2023   K 2.9 (L) 01/07/2023   CL 99 01/07/2023   CREATININE 0.90 01/07/2023   BUN 11 01/07/2023   CO2 23 01/07/2023     IMAGES: CT Head 01/22/23: IMPRESSION: 1. No significant interval change in size or density of right subdural hematoma. 2. Stable leftward midline shift of 4 mm. 3. Chronic left cerebellar infarct. 4. Atrophic brain.    EKG: 01/06/23: Atrial-sensed ventricular-paced rhythm Prolonged PR interval Confirmed by Fulton Reek [phone]) on 01/06/2023 1:56:47 PM   CV: Echo 05/01/20: IMPRESSIONS   1. Left ventricular ejection fraction, by estimation, is 60 to 65%.  The  left ventricle has normal function. The left ventricle has no regional  wall motion abnormalities. Left ventricular diastolic parameters are  indeterminate.   2. Right ventricular systolic function is normal. The right ventricular  size is normal. There is normal pulmonary artery systolic pressure.   3.  Left atrial size was mildly dilated.   4. The mitral valve is normal in structure. Mild mitral valve  regurgitation. No evidence of mitral stenosis.   5. The aortic valve was not well visualized. Aortic valve regurgitation  is mild. Mild aortic valve sclerosis is present, with no evidence of  aortic valve stenosis.   6. The inferior vena cava is normal in size with greater than 50%  respiratory variability, suggesting right atrial pressure of 3 mmHg.  - Comparison(s): 01/16/13 EF 55-60%.    Nuclear stress test 04/28/15: Nuclear stress EF: 66%. There was no ST segment deviation noted during stress. This is a low risk study. The left ventricular ejection fraction is hyperdynamic (>65%).   1. Fixed small, mild basal anterolateral perfusion defect.  Given normal wall motion, this likely represents attenuation.  No ischemia.  2. Normal wall motion and normal EF. 3. Low risk study.     US Carotid 01/16/13: Summary:  Findings suggest 1-39% internal carotid artery stenosis  bilaterally without evidence of significant plaque  morphology. Vertebral arteries are patent with antegrade  flow.    Past Medical History:  Diagnosis Date   Abnormal Pap smear of cervix    Breast cancer (HCC)    Right breast, status post XRT   Cataracts, both eyes    Chest pain 2015   recent episode while in sun- recovered in shade with water   Colon polyp    Depression    Diverticulitis    Diverticulosis    Elevated LDL cholesterol level    Essential tremor    Goiter    multinodular   Hyperlipidemia    Hypertension    Osteopenia 12/1995   Spine   Ovarian cyst    found on CT scan   Post-menopausal bleeding 01/2000   Huge polyps exc   Presence of permanent cardiac pacemaker    Syncope    Vitamin D deficiency     Past Surgical History:  Procedure Laterality Date   Bilateral cateract surgery  2014   BREAST LUMPECTOMY Right 4/96   HYSTEROSCOPY  0/01   LAPAROSCOPIC BILATERAL SALPINGO OOPHERECTOMY  Bilateral 01/07/2014   Procedure: LAPAROSCOPIC BILATERAL SALPINGO OOPHORECTOMY;  Surgeon: Annamaria Boots, MD;  Location: [facility];  Service: Gynecology;  Laterality: Bilateral;   PACEMAKER IMPLANT N/A 05/26/2020   Procedure: PACEMAKER IMPLANT;  Surgeon: Lanier Prude, MD;  Location: [facility] INVASIVE CV LAB;  Service: Cardiovascular;  Laterality: N/A;    MEDICATIONS: No current facility-administered medications for this encounter.    amLODipine (NORVASC) 5 MG tablet   atorvastatin (LIPITOR) 10 MG tablet   Calcium Citrate-Vitamin D (CITRACAL PETITES/VITAMIN D PO)   carboxymethylcellulose (REFRESH PLUS) 0.5 % SOLN   ciprofloxacin (CIPRO) 250 MG tablet   clonazePAM (KLONOPIN) 0.5 MG tablet   Flaxseed, Linseed, (SM FLAX SEED OIL) 1000 MG CAPS   ibandronate (BONIVA) 150 MG tablet   Lactobacillus Rhamnosus, GG, (CULTURELLE PO)   Multiple Vitamin (MULTIVITAMIN WITH MINERALS) TABS tablet   omeprazole (PRILOSEC) 20 MG capsule   polyethylene glycol powder (GLYCOLAX/MIRALAX) powder   primidone (MYSOLINE) 50 MG tablet   propranolol (INDERAL) 40 MG tablet   valsartan (DIOVAN) 320 MG tablet   HYDROcodone-acetaminophen (  NORCO/VICODIN) 5-325 MG tablet   ondansetron (ZOFRAN) 4 MG tablet   oxyCODONE (ROXICODONE) 5 MG immediate release tablet    Shonna Chock, PA-C Surgical Short Stay/Anesthesiology [facility] Phone [phone] [facility] Phone [phone] 02/02/2023 1:28 PM

## 2023-02-02 NOTE — Progress Notes (Signed)
PERIOPERATIVE PRESCRIPTION FOR IMPLANTED CARDIAC DEVICE PROGRAMMING  Patient Information: Name:  SAHIB SHANKLES  DOB:  Feb 19, 1935  MRN:  [id number]    Planned Procedure:  Right Ezekiel Ina for Subdural Evacuation  Surgeon:  Percell Miller  Date of Procedure:  September 12  Cautery will be used.  Position during surgery:  supine  Device Information:  Clinic EP Physician:  Steffanie Dunn, MD   Device Type:  Pacemaker Manufacturer and Phone #:  [employer]: [phone] Pacemaker Dependent?:  Yes.  (Intermittent, VP greater than 90%)  Date of Last Device Check:  In office 05/27/22,  Remote: 11/28/22 Normal Device Function?:  Yes.    Electrophysiologist's Recommendations:  Have magnet available. Provide continuous ECG monitoring when magnet is used or reprogramming is to be performed.  Procedure may interfere with device function.  Magnet should be placed over device during procedure.  Per Device Clinic Standing Orders, Kizzie Ide, RN  1:18 PM 02/02/2023

## 2023-02-02 NOTE — Progress Notes (Signed)
PCP - Charlane Ferretti Cardiologist - Harmon Pier Electrophysiology cardiologist -  PPM/ICD - Pacemaker St. Jude/Abott Device Orders - yes have magnet available  Rep Notified - Yes Brain by N. Argentina RN  Chest x-ray - 05-27-20 EKG - 01-06-23 Stress Test - 04-28-15 ECHO - 12-921 Cardiac Cath -   CPAP - Denies  DM- denies  Blood Thinner Instructions:  denies Aspirin Instructions: n/a  ERAS Protcol - NPO  COVID TEST- no  Anesthesia review: Yes Cardiac hx and pacemaker  Patient verbally denies any shortness of breath, fever, cough and chest pain during phone call   -------------  SDW INSTRUCTIONS given:  Your procedure is scheduled on Sept. 12, 2024.  Report to [facility] Main Entrance "A" at 10:40 A.M., and check in at the Admitting office.  Call this number if you have problems the morning of surgery:  [phone]   Remember:  Do not eat or drink after midnight the night before your surgery     Take these medicines the morning of surgery with A SIP OF WATER  amLODipine (NORVASC)  atorvastatin (LIPITOR)  primidone (MYSOLINE)  propranolol (INDERAL)    As of today, STOP taking any Aspirin (unless otherwise instructed by your surgeon) Aleve, Naproxen, Ibuprofen, Motrin, Advil, Goody's, BC's, all herbal medications, fish oil, and all vitamins.                      Do not wear jewelry, make up, or nail polish            Do not wear lotions, powders, perfumes/colognes, or deodorant.            Do not shave 48 hours prior to surgery.  Men may shave face and neck.            Do not bring valuables to the hospital.            [facility] is not responsible for any belongings or valuables.  Do NOT Smoke (Tobacco/Vaping) 24 hours prior to your procedure If you use a CPAP at night, you may bring all equipment for your overnight stay.   Contacts, glasses, dentures or bridgework may not be worn into surgery.      For patients admitted to the hospital, discharge time will be  determined by your treatment team.   Patients discharged the day of surgery will not be allowed to drive home, and someone needs to stay with them for 24 hours.    Special instructions:   [facility]- Preparing For Surgery  Before surgery, you can play an important role. Because skin is not sterile, your skin needs to be as free of germs as possible. You can reduce the number of germs on your skin by washing with CHG (chlorahexidine gluconate) Soap before surgery.  CHG is an antiseptic cleaner which kills germs and bonds with the skin to continue killing germs even after washing.    Oral Hygiene is also important to reduce your risk of infection.  Remember - BRUSH YOUR TEETH THE MORNING OF SURGERY WITH YOUR REGULAR TOOTHPASTE  Please do not use if you have an allergy to CHG or antibacterial soaps. If your skin becomes reddened/irritated stop using the CHG.  Do not shave (including legs and underarms) for at least 48 hours prior to first CHG shower. It is OK to shave your face.  Please follow these instructions carefully.   Shower the NIGHT BEFORE SURGERY and the MORNING OF SURGERY with DIAL Soap.  Pat yourself dry with a CLEAN TOWEL.  Wear CLEAN PAJAMAS to bed the night before surgery  Place CLEAN SHEETS on your bed the night of your first shower and DO NOT SLEEP WITH PETS.   Day of Surgery: Please shower morning of surgery  Wear Clean/Comfortable clothing the morning of surgery Do not apply any deodorants/lotions.   Remember to brush your teeth WITH YOUR REGULAR TOOTHPASTE.   Questions were answered. Patient verbalized understanding of instructions.

## 2023-02-03 ENCOUNTER — Inpatient Hospital Stay (HOSPITAL_COMMUNITY): Payer: Medicare PPO | Admitting: Vascular Surgery

## 2023-02-03 ENCOUNTER — Inpatient Hospital Stay (HOSPITAL_COMMUNITY)
Admission: RE | Admit: 2023-02-03 | Discharge: 2023-02-07 | DRG: 027 | Disposition: A | Discharge status: Home Health | Payer: Medicare PPO | Attending: Neurosurgery | Admitting: Neurosurgery

## 2023-02-03 ENCOUNTER — Encounter (HOSPITAL_COMMUNITY)
Admission: RE | Disposition: A | Discharge status: Home Health | Payer: Self-pay | Source: Home / Self Care | Attending: Neurosurgery

## 2023-02-03 ENCOUNTER — Other Ambulatory Visit: Payer: Self-pay

## 2023-02-03 ENCOUNTER — Encounter (HOSPITAL_COMMUNITY): Payer: Self-pay | Admitting: *Deleted

## 2023-02-03 DIAGNOSIS — Z882 Allergy status to sulfonamides status: Secondary | ICD-10-CM | POA: Diagnosis not present

## 2023-02-03 DIAGNOSIS — Z888 Allergy status to other drugs, medicaments and biological substances status: Secondary | ICD-10-CM | POA: Diagnosis not present

## 2023-02-03 DIAGNOSIS — G250 Essential tremor: Secondary | ICD-10-CM | POA: Diagnosis present

## 2023-02-03 DIAGNOSIS — Z79899 Other long term (current) drug therapy: Secondary | ICD-10-CM | POA: Diagnosis not present

## 2023-02-03 DIAGNOSIS — Z91040 Latex allergy status: Secondary | ICD-10-CM

## 2023-02-03 DIAGNOSIS — R296 Repeated falls: Secondary | ICD-10-CM | POA: Diagnosis present

## 2023-02-03 DIAGNOSIS — M8580 Other specified disorders of bone density and structure, unspecified site: Secondary | ICD-10-CM | POA: Diagnosis present

## 2023-02-03 DIAGNOSIS — S065X0A Traumatic subdural hemorrhage without loss of consciousness, initial encounter: Secondary | ICD-10-CM | POA: Diagnosis not present

## 2023-02-03 DIAGNOSIS — I6200 Nontraumatic subdural hemorrhage, unspecified: Secondary | ICD-10-CM

## 2023-02-03 DIAGNOSIS — E782 Mixed hyperlipidemia: Secondary | ICD-10-CM | POA: Diagnosis not present

## 2023-02-03 DIAGNOSIS — Z950 Presence of cardiac pacemaker: Secondary | ICD-10-CM

## 2023-02-03 DIAGNOSIS — S065XAA Traumatic subdural hemorrhage with loss of consciousness status unknown, initial encounter: Principal | ICD-10-CM | POA: Diagnosis present

## 2023-02-03 DIAGNOSIS — Z853 Personal history of malignant neoplasm of breast: Secondary | ICD-10-CM

## 2023-02-03 DIAGNOSIS — I10 Essential (primary) hypertension: Secondary | ICD-10-CM | POA: Diagnosis present

## 2023-02-03 DIAGNOSIS — Z820 Family history of epilepsy and other diseases of the nervous system: Secondary | ICD-10-CM

## 2023-02-03 DIAGNOSIS — Z8349 Family history of other endocrine, nutritional and metabolic diseases: Secondary | ICD-10-CM

## 2023-02-03 DIAGNOSIS — K219 Gastro-esophageal reflux disease without esophagitis: Secondary | ICD-10-CM | POA: Diagnosis present

## 2023-02-03 DIAGNOSIS — Z982 Presence of cerebrospinal fluid drainage device: Secondary | ICD-10-CM | POA: Diagnosis not present

## 2023-02-03 DIAGNOSIS — Z885 Allergy status to narcotic agent status: Secondary | ICD-10-CM | POA: Diagnosis not present

## 2023-02-03 DIAGNOSIS — I6203 Nontraumatic chronic subdural hemorrhage: Secondary | ICD-10-CM | POA: Diagnosis not present

## 2023-02-03 DIAGNOSIS — Z880 Allergy status to penicillin: Secondary | ICD-10-CM | POA: Diagnosis not present

## 2023-02-03 DIAGNOSIS — E785 Hyperlipidemia, unspecified: Secondary | ICD-10-CM

## 2023-02-03 DIAGNOSIS — Z823 Family history of stroke: Secondary | ICD-10-CM | POA: Diagnosis not present

## 2023-02-03 DIAGNOSIS — Z8249 Family history of ischemic heart disease and other diseases of the circulatory system: Secondary | ICD-10-CM | POA: Diagnosis not present

## 2023-02-03 DIAGNOSIS — Z923 Personal history of irradiation: Secondary | ICD-10-CM

## 2023-02-03 DIAGNOSIS — I495 Sick sinus syndrome: Secondary | ICD-10-CM | POA: Diagnosis present

## 2023-02-03 DIAGNOSIS — F32A Depression, unspecified: Secondary | ICD-10-CM | POA: Diagnosis present

## 2023-02-03 DIAGNOSIS — W19XXXA Unspecified fall, initial encounter: Secondary | ICD-10-CM | POA: Diagnosis present

## 2023-02-03 HISTORY — DX: Unspecified osteoarthritis, unspecified site: M19.90

## 2023-02-03 HISTORY — PX: BURR HOLE: SHX908

## 2023-02-03 HISTORY — DX: Presence of cardiac pacemaker: Z95.0

## 2023-02-03 LAB — POCT I-STAT, CHEM 8
BUN: 20 mg/dL (ref 8–23)
Calcium, Ion: 1.2 mmol/L (ref 1.15–1.40)
Chloride: 104 mmol/L (ref 98–111)
Creatinine, Ser: 0.8 mg/dL (ref 0.44–1.00)
Glucose, Bld: 94 mg/dL (ref 70–99)
HCT: 45 % (ref 36.0–46.0)
Hemoglobin: 15.3 g/dL — ABNORMAL HIGH (ref 12.0–15.0)
Potassium: 4 mmol/L (ref 3.5–5.1)
Sodium: 138 mmol/L (ref 135–145)
TCO2: 25 mmol/L (ref 22–32)

## 2023-02-03 LAB — ABO/RH: ABO/RH(D): O POS

## 2023-02-03 LAB — TYPE AND SCREEN
ABO/RH(D): O POS
Antibody Screen: NEGATIVE

## 2023-02-03 LAB — MRSA NEXT GEN BY PCR, NASAL: MRSA by PCR Next Gen: NOT DETECTED

## 2023-02-03 SURGERY — CREATION, CRANIAL BURR HOLE
Anesthesia: General | Laterality: Right

## 2023-02-03 MED ORDER — HYDROCODONE-ACETAMINOPHEN 5-325 MG PO TABS
1.0000 | ORAL_TABLET | ORAL | Status: DC | PRN
  Administered 2023-02-03: 1 via ORAL
  Filled 2023-02-03 (×2): qty 1

## 2023-02-03 MED ORDER — FENTANYL CITRATE (PF) 100 MCG/2ML IJ SOLN
25.0000 ug | INTRAMUSCULAR | Status: DC | PRN
  Administered 2023-02-03: 50 ug via INTRAVENOUS

## 2023-02-03 MED ORDER — PROPOFOL 10 MG/ML IV BOLUS
INTRAVENOUS | Status: AC
  Filled 2023-02-03: qty 20

## 2023-02-03 MED ORDER — SUGAMMADEX SODIUM 200 MG/2ML IV SOLN
INTRAVENOUS | Status: DC | PRN
  Administered 2023-02-03: 150 mg via INTRAVENOUS

## 2023-02-03 MED ORDER — THROMBIN 20000 UNITS EX SOLR
CUTANEOUS | Status: AC
  Filled 2023-02-03: qty 20000

## 2023-02-03 MED ORDER — PROMETHAZINE HCL 25 MG PO TABS: 12.5000 mg | ORAL_TABLET | ORAL | Status: DC | PRN

## 2023-02-03 MED ORDER — FENTANYL CITRATE (PF) 250 MCG/5ML IJ SOLN
INTRAMUSCULAR | Status: DC | PRN
  Administered 2023-02-03: 100 ug via INTRAVENOUS

## 2023-02-03 MED ORDER — HYDRALAZINE HCL 20 MG/ML IJ SOLN
10.0000 mg | Freq: Once | INTRAMUSCULAR | Status: AC
  Administered 2023-02-03: 10 mg via INTRAVENOUS

## 2023-02-03 MED ORDER — VANCOMYCIN HCL 750 MG/150ML IV SOLN
750.0000 mg | INTRAVENOUS | Status: DC
  Administered 2023-02-04 – 2023-02-05 (×2): 750 mg via INTRAVENOUS
  Filled 2023-02-03 (×2): qty 150

## 2023-02-03 MED ORDER — LIDOCAINE-EPINEPHRINE 1 %-1:100000 IJ SOLN
INTRAMUSCULAR | Status: DC | PRN
  Administered 2023-02-03: 10 mL

## 2023-02-03 MED ORDER — CHLORHEXIDINE GLUCONATE 0.12 % MT SOLN
15.0000 mL | Freq: Once | OROMUCOSAL | Status: DC
  Filled 2023-02-03: qty 15

## 2023-02-03 MED ORDER — PROPOFOL 10 MG/ML IV BOLUS
INTRAVENOUS | Status: DC | PRN
  Administered 2023-02-03: 60 mg via INTRAVENOUS

## 2023-02-03 MED ORDER — HYDRALAZINE HCL 20 MG/ML IJ SOLN
INTRAMUSCULAR | Status: AC
  Filled 2023-02-03: qty 1

## 2023-02-03 MED ORDER — ROCURONIUM BROMIDE 10 MG/ML (PF) SYRINGE
PREFILLED_SYRINGE | INTRAVENOUS | Status: DC | PRN
  Administered 2023-02-03: 60 mg via INTRAVENOUS

## 2023-02-03 MED ORDER — SODIUM CHLORIDE 0.9 % IV SOLN: INTRAVENOUS | Status: DC

## 2023-02-03 MED ORDER — 0.9 % SODIUM CHLORIDE (POUR BTL) OPTIME
TOPICAL | Status: DC | PRN
  Administered 2023-02-03: 3000 mL

## 2023-02-03 MED ORDER — LIDOCAINE 2% (20 MG/ML) 5 ML SYRINGE
INTRAMUSCULAR | Status: AC
  Filled 2023-02-03: qty 5

## 2023-02-03 MED ORDER — CHLORHEXIDINE GLUCONATE CLOTH 2 % EX PADS: 6.0000 | MEDICATED_PAD | Freq: Once | CUTANEOUS | Status: DC

## 2023-02-03 MED ORDER — DROPERIDOL 2.5 MG/ML IJ SOLN: 0.6250 mg | Freq: Once | INTRAMUSCULAR | Status: DC | PRN

## 2023-02-03 MED ORDER — THROMBIN 20000 UNITS EX SOLR
CUTANEOUS | Status: DC | PRN
  Administered 2023-02-03: 20 mL via TOPICAL

## 2023-02-03 MED ORDER — PRIMIDONE 50 MG PO TABS
150.0000 mg | ORAL_TABLET | Freq: Every day | ORAL | Status: DC
  Administered 2023-02-04 – 2023-02-07 (×4): 150 mg via ORAL
  Filled 2023-02-03 (×4): qty 3

## 2023-02-03 MED ORDER — CEFAZOLIN SODIUM-DEXTROSE 1-4 GM/50ML-% IV SOLN: 1.0000 g | Freq: Three times a day (TID) | INTRAVENOUS | Status: DC

## 2023-02-03 MED ORDER — POLYETHYLENE GLYCOL 3350 17 G PO PACK: 17.0000 g | PACK | Freq: Every day | ORAL | Status: DC | PRN

## 2023-02-03 MED ORDER — FENTANYL CITRATE (PF) 250 MCG/5ML IJ SOLN
INTRAMUSCULAR | Status: AC
  Filled 2023-02-03: qty 5

## 2023-02-03 MED ORDER — LACTATED RINGERS IV SOLN: INTRAVENOUS | Status: DC

## 2023-02-03 MED ORDER — MIDAZOLAM HCL 2 MG/2ML IJ SOLN
INTRAMUSCULAR | Status: AC
  Filled 2023-02-03: qty 2

## 2023-02-03 MED ORDER — ACETAMINOPHEN 650 MG RE SUPP: 650.0000 mg | RECTAL | Status: DC | PRN

## 2023-02-03 MED ORDER — ONDANSETRON HCL 4 MG PO TABS: 4.0000 mg | ORAL_TABLET | ORAL | Status: DC | PRN

## 2023-02-03 MED ORDER — IRBESARTAN 300 MG PO TABS
300.0000 mg | ORAL_TABLET | Freq: Every day | ORAL | Status: DC
  Administered 2023-02-04 – 2023-02-07 (×4): 300 mg via ORAL
  Filled 2023-02-03: qty 2
  Filled 2023-02-03 (×2): qty 1
  Filled 2023-02-03: qty 2

## 2023-02-03 MED ORDER — FENTANYL CITRATE (PF) 100 MCG/2ML IJ SOLN
INTRAMUSCULAR | Status: AC
  Filled 2023-02-03: qty 2

## 2023-02-03 MED ORDER — MORPHINE SULFATE (PF) 2 MG/ML IV SOLN
1.0000 mg | INTRAVENOUS | Status: DC | PRN
  Administered 2023-02-03 – 2023-02-04 (×3): 1 mg via INTRAVENOUS
  Filled 2023-02-03 (×3): qty 1

## 2023-02-03 MED ORDER — LIDOCAINE-EPINEPHRINE 1 %-1:100000 IJ SOLN
INTRAMUSCULAR | Status: AC
  Filled 2023-02-03: qty 1

## 2023-02-03 MED ORDER — PHENYLEPHRINE 80 MCG/ML (10ML) SYRINGE FOR IV PUSH (FOR BLOOD PRESSURE SUPPORT)
PREFILLED_SYRINGE | INTRAVENOUS | Status: AC
  Filled 2023-02-03: qty 10

## 2023-02-03 MED ORDER — ONDANSETRON HCL 4 MG/2ML IJ SOLN
4.0000 mg | INTRAMUSCULAR | Status: DC | PRN
  Administered 2023-02-03 – 2023-02-04 (×2): 4 mg via INTRAVENOUS
  Filled 2023-02-03 (×2): qty 2

## 2023-02-03 MED ORDER — AMLODIPINE BESYLATE 5 MG PO TABS
5.0000 mg | ORAL_TABLET | Freq: Every day | ORAL | Status: DC
  Administered 2023-02-04 – 2023-02-06 (×3): 5 mg via ORAL
  Filled 2023-02-03 (×3): qty 1

## 2023-02-03 MED ORDER — LABETALOL HCL 5 MG/ML IV SOLN
10.0000 mg | INTRAVENOUS | Status: DC | PRN
  Administered 2023-02-03: 10 mg via INTRAVENOUS
  Administered 2023-02-04: 20 mg via INTRAVENOUS
  Administered 2023-02-04: 10 mg via INTRAVENOUS
  Administered 2023-02-04: 20 mg via INTRAVENOUS
  Administered 2023-02-05 (×2): 10 mg via INTRAVENOUS
  Filled 2023-02-03 (×5): qty 4
  Filled 2023-02-03: qty 8

## 2023-02-03 MED ORDER — FLEET ENEMA RE ENEM: 1.0000 | ENEMA | Freq: Once | RECTAL | Status: DC | PRN

## 2023-02-03 MED ORDER — ONDANSETRON HCL 4 MG/2ML IJ SOLN
INTRAMUSCULAR | Status: DC | PRN
  Administered 2023-02-03: 4 mg via INTRAVENOUS

## 2023-02-03 MED ORDER — CHLORHEXIDINE GLUCONATE 0.12 % MT SOLN
15.0000 mL | OROMUCOSAL | Status: AC
  Administered 2023-02-03: 15 mL via OROMUCOSAL

## 2023-02-03 MED ORDER — ACETAMINOPHEN 325 MG PO TABS: 650.0000 mg | ORAL_TABLET | ORAL | Status: DC | PRN

## 2023-02-03 MED ORDER — DEXAMETHASONE SODIUM PHOSPHATE 10 MG/ML IJ SOLN
INTRAMUSCULAR | Status: DC | PRN
  Administered 2023-02-03: 8 mg via INTRAVENOUS

## 2023-02-03 MED ORDER — THROMBIN 5000 UNITS EX SOLR
CUTANEOUS | Status: AC
  Filled 2023-02-03: qty 5000

## 2023-02-03 MED ORDER — MIDAZOLAM HCL 2 MG/2ML IJ SOLN
INTRAMUSCULAR | Status: DC | PRN
  Administered 2023-02-03: 1 mg via INTRAVENOUS

## 2023-02-03 MED ORDER — ROCURONIUM BROMIDE 10 MG/ML (PF) SYRINGE
PREFILLED_SYRINGE | INTRAVENOUS | Status: AC
  Filled 2023-02-03: qty 10

## 2023-02-03 MED ORDER — DOCUSATE SODIUM 100 MG PO CAPS
100.0000 mg | ORAL_CAPSULE | Freq: Two times a day (BID) | ORAL | Status: DC
  Administered 2023-02-03 – 2023-02-07 (×7): 100 mg via ORAL
  Filled 2023-02-03 (×8): qty 1

## 2023-02-03 MED ORDER — DEXAMETHASONE SODIUM PHOSPHATE 10 MG/ML IJ SOLN
INTRAMUSCULAR | Status: AC
  Filled 2023-02-03: qty 1

## 2023-02-03 MED ORDER — ATORVASTATIN CALCIUM 10 MG PO TABS
10.0000 mg | ORAL_TABLET | Freq: Every morning | ORAL | Status: DC
  Administered 2023-02-04 – 2023-02-07 (×4): 10 mg via ORAL
  Filled 2023-02-03 (×4): qty 1

## 2023-02-03 MED ORDER — PROPRANOLOL HCL 10 MG PO TABS
40.0000 mg | ORAL_TABLET | Freq: Two times a day (BID) | ORAL | Status: DC
  Administered 2023-02-03 – 2023-02-07 (×8): 40 mg via ORAL
  Filled 2023-02-03: qty 1
  Filled 2023-02-03 (×2): qty 4
  Filled 2023-02-03 (×3): qty 1
  Filled 2023-02-03: qty 4
  Filled 2023-02-03: qty 1
  Filled 2023-02-03: qty 4

## 2023-02-03 MED ORDER — LIDOCAINE 2% (20 MG/ML) 5 ML SYRINGE
INTRAMUSCULAR | Status: DC | PRN
  Administered 2023-02-03: 60 mg via INTRAVENOUS

## 2023-02-03 MED ORDER — LEVETIRACETAM IN NACL 500 MG/100ML IV SOLN
500.0000 mg | Freq: Two times a day (BID) | INTRAVENOUS | Status: DC
  Administered 2023-02-03 – 2023-02-07 (×8): 500 mg via INTRAVENOUS
  Filled 2023-02-03 (×8): qty 100

## 2023-02-03 MED ORDER — ORAL CARE MOUTH RINSE: 15.0000 mL | Freq: Once | OROMUCOSAL | Status: DC

## 2023-02-03 MED ORDER — CHLORHEXIDINE GLUCONATE CLOTH 2 % EX PADS
6.0000 | MEDICATED_PAD | Freq: Every morning | CUTANEOUS | Status: DC
  Administered 2023-02-04 – 2023-02-06 (×2): 6 via TOPICAL

## 2023-02-03 MED ORDER — VANCOMYCIN HCL IN DEXTROSE 1-5 GM/200ML-% IV SOLN
1000.0000 mg | INTRAVENOUS | Status: AC
  Administered 2023-02-03: 1000 mg via INTRAVENOUS
  Filled 2023-02-03: qty 200

## 2023-02-03 MED ORDER — PANTOPRAZOLE SODIUM 40 MG IV SOLR
40.0000 mg | Freq: Every day | INTRAVENOUS | Status: DC
  Administered 2023-02-03 – 2023-02-05 (×3): 40 mg via INTRAVENOUS
  Filled 2023-02-03 (×4): qty 10

## 2023-02-03 MED ORDER — THROMBIN 5000 UNITS EX SOLR
OROMUCOSAL | Status: DC | PRN
  Administered 2023-02-03: 5 mL via TOPICAL

## 2023-02-03 MED ORDER — PHENYLEPHRINE 80 MCG/ML (10ML) SYRINGE FOR IV PUSH (FOR BLOOD PRESSURE SUPPORT)
PREFILLED_SYRINGE | INTRAVENOUS | Status: DC | PRN
  Administered 2023-02-03 (×2): 160 ug via INTRAVENOUS

## 2023-02-03 MED ORDER — ONDANSETRON HCL 4 MG/2ML IJ SOLN
INTRAMUSCULAR | Status: AC
  Filled 2023-02-03: qty 2

## 2023-02-03 SURGICAL SUPPLY — 78 items
ADH SKN CLS APL DERMABOND .7 (GAUZE/BANDAGES/DRESSINGS) ×1
APL SKNCLS STERI-STRIP NONHPOA (GAUZE/BANDAGES/DRESSINGS) ×1
BAG COUNTER SPONGE SURGICOUNT (BAG) ×2 IMPLANT
BAG DRN CSF CATH SYS STRL MNTR (MISCELLANEOUS) ×1
BAG SPNG CNTER NS LX DISP (BAG) ×1
BENZOIN TINCTURE PRP APPL 2/3 (GAUZE/BANDAGES/DRESSINGS) ×2 IMPLANT
BLADE CLIPPER SURG (BLADE) ×2 IMPLANT
BNDG GAUZE DERMACEA FLUFF 4 (GAUZE/BANDAGES/DRESSINGS) IMPLANT
BNDG GZE DERMACEA 4 6PLY (GAUZE/BANDAGES/DRESSINGS)
BRR ADH 6X5 SEPRAFILM 1 SHT (MISCELLANEOUS)
BUR ACORN 9.0 PRECISION (BURR) IMPLANT
BUR SPIRAL ROUTER 2.3 (BUR) ×2 IMPLANT
CANISTER SUCT 3000ML PPV (MISCELLANEOUS) ×2 IMPLANT
CATH VENTRIC 35X38 W/TROCAR LG (CATHETERS) IMPLANT
CLIP TI MEDIUM 6 (CLIP) IMPLANT
DERMABOND ADVANCED .7 DNX12 (GAUZE/BANDAGES/DRESSINGS) ×2 IMPLANT
DRAPE MICROSCOPE SLANT 54X150 (MISCELLANEOUS) IMPLANT
DRAPE NEUROLOGICAL W/INCISE (DRAPES) ×2 IMPLANT
DRAPE SHEET LG 3/4 BI-LAMINATE (DRAPES) ×2 IMPLANT
DRAPE WARM FLUID 44X44 (DRAPES) ×2 IMPLANT
DRESSING MEPILEX FLEX 4X4 (GAUZE/BANDAGES/DRESSINGS) IMPLANT
DRSG AQUACEL AG ADV 3.5X 6 (GAUZE/BANDAGES/DRESSINGS) ×2 IMPLANT
DRSG AQUACEL AG ADV 3.5X10 (GAUZE/BANDAGES/DRESSINGS) IMPLANT
DRSG MEPILEX FLEX 4X4 (GAUZE/BANDAGES/DRESSINGS)
DRSG XEROFORM 1X8 (GAUZE/BANDAGES/DRESSINGS) IMPLANT
DURAPREP 26ML APPLICATOR (WOUND CARE) ×2 IMPLANT
ELECT COATED BLADE 2.86 ST (ELECTRODE) ×2 IMPLANT
ELECT REM PT RETURN 9FT ADLT (ELECTROSURGICAL) ×1
ELECTRODE REM PT RTRN 9FT ADLT (ELECTROSURGICAL) ×2 IMPLANT
EVACUATOR 1/8 PVC DRAIN (DRAIN) IMPLANT
EVACUATOR SILICONE 100CC (DRAIN) IMPLANT
FORCEPS BIPOLAR SPETZLER 8 1.0 (NEUROSURGERY SUPPLIES) ×2 IMPLANT
GAUZE 4X4 16PLY ~~LOC~~+RFID DBL (SPONGE) IMPLANT
GAUZE SPONGE 4X4 12PLY STRL (GAUZE/BANDAGES/DRESSINGS) ×2 IMPLANT
GLOVE BIO SURGEON STRL SZ7 (GLOVE) ×4 IMPLANT
GLOVE BIOGEL PI IND STRL 7.5 (GLOVE) ×6 IMPLANT
GLOVE ECLIPSE 7.5 STRL STRAW (GLOVE) ×2 IMPLANT
GOWN STRL REUS W/ TWL LRG LVL3 (GOWN DISPOSABLE) ×4 IMPLANT
GOWN STRL REUS W/ TWL XL LVL3 (GOWN DISPOSABLE) ×2 IMPLANT
GOWN STRL REUS W/TWL 2XL LVL3 (GOWN DISPOSABLE) IMPLANT
GOWN STRL REUS W/TWL LRG LVL3 (GOWN DISPOSABLE) ×2
GOWN STRL REUS W/TWL XL LVL3 (GOWN DISPOSABLE) ×1
HEMOSTAT POWDER KIT SURGIFOAM (HEMOSTASIS) ×2 IMPLANT
HEMOSTAT SURGICEL 2X14 (HEMOSTASIS) ×2 IMPLANT
HOOK RETRACTION 12 ELAST STAY (MISCELLANEOUS) IMPLANT
KIT BASIN OR (CUSTOM PROCEDURE TRAY) ×2 IMPLANT
KIT TURNOVER KIT B (KITS) ×2 IMPLANT
NDL HYPO 22X1.5 SAFETY MO (MISCELLANEOUS) ×2 IMPLANT
NEEDLE HYPO 22X1.5 SAFETY MO (MISCELLANEOUS) ×1 IMPLANT
NS IRRIG 1000ML POUR BTL (IV SOLUTION) ×6 IMPLANT
PACK CRANIOTOMY CUSTOM (CUSTOM PROCEDURE TRAY) ×2 IMPLANT
PATTIES SURGICAL .5 X.5 (GAUZE/BANDAGES/DRESSINGS) IMPLANT
PATTIES SURGICAL .5 X3 (DISPOSABLE) IMPLANT
PATTIES SURGICAL 1X1 (DISPOSABLE) IMPLANT
PERFORATOR LRG 14-11MM (BIT) ×2 IMPLANT
SEPRAFILM MEMBRANE 5X6 (MISCELLANEOUS) IMPLANT
SPONGE NEURO XRAY DETECT 1X3 (DISPOSABLE) IMPLANT
SPONGE SURGIFOAM ABS GEL 100 (HEMOSTASIS) ×2 IMPLANT
SPONGE T-LAP 18X18 ~~LOC~~+RFID (SPONGE) ×2 IMPLANT
STAPLER VISISTAT 35W (STAPLE) ×4 IMPLANT
STOCKINETTE 6 STRL (DRAPES) ×2 IMPLANT
STRIP CLOSURE SKIN 1/2X4 (GAUZE/BANDAGES/DRESSINGS) ×2 IMPLANT
SUT ETHILON 3 0 FSL (SUTURE) IMPLANT
SUT ETHILON 3 0 PS 1 (SUTURE) IMPLANT
SUT MON AB 3-0 SH 27 (SUTURE)
SUT MON AB 3-0 SH27 (SUTURE) IMPLANT
SUT NURALON 4 0 TR CR/8 (SUTURE) IMPLANT
SUT VIC AB 2-0 CP2 18 (SUTURE) ×4 IMPLANT
SUT VIC AB 3-0 SH 8-18 (SUTURE) IMPLANT
SUT VICRYL RAPIDE 3 0 (SUTURE) IMPLANT
SYSTEM CSF EXTERNAL DRAINAGE (MISCELLANEOUS) IMPLANT
TAPE PAPER 1X10 WHT MICROPORE (GAUZE/BANDAGES/DRESSINGS) ×2 IMPLANT
TAPE PAPER 2X10 WHT MICROPORE (GAUZE/BANDAGES/DRESSINGS) ×2 IMPLANT
TOWEL GREEN STERILE (TOWEL DISPOSABLE) ×2 IMPLANT
TOWEL GREEN STERILE FF (TOWEL DISPOSABLE) ×2 IMPLANT
TRAY FOLEY MTR SLVR 16FR STAT (SET/KITS/TRAYS/PACK) ×2 IMPLANT
TUBE CONNECTING 20X1/4 (TUBING) ×2 IMPLANT
WATER STERILE IRR 1000ML POUR (IV SOLUTION) ×2 IMPLANT

## 2023-02-03 NOTE — Transfer of Care (Signed)
Immediate Anesthesia Transfer of Care Note  Patient: Michelle Fuller  Procedure(s) Performed: Ezekiel Ina (Right)  Patient Location: PACU  Anesthesia Type:General  Level of Consciousness: awake, alert , oriented, and patient cooperative  Airway & Oxygen Therapy: Patient Spontanous Breathing and Patient connected to face mask oxygen  Post-op Assessment: Report given to RN and Post -op Vital signs reviewed and stable  Post vital signs: Reviewed and stable  Last Vitals:  Vitals Value Taken Time  BP 181/89 02/03/23 1515  Temp 36.5 C 02/03/23 1515  Pulse 70 02/03/23 1525  Resp 13 02/03/23 1525  SpO2 96 % 02/03/23 1525  Vitals shown include unfiled device data.  Last Pain:  Vitals:   02/03/23 1151  TempSrc:   PainSc: 0-No pain      Patients Stated Pain Goal: 0 (02/03/23 1151)  Complications: No notable events documented.

## 2023-02-03 NOTE — Op Note (Signed)
CC: dizziness, balance issues  HPI:     Patient is a 88 y.o. female presents after fall with head trauma 2 months ago, found to have large right chronic SDH, unchanged in size on repeat CT 2 weeks later.  She has developed poor ambulatory ability, low energy, and functional loss.    Patient Active Problem List   Diagnosis Date Noted   SDH (subdural hematoma) (HCC) 01/06/2023   Pacemaker 09/16/2020   Symptomatic bradycardia 05/26/2020   Sick sinus syndrome (HCC) 04/04/2020   Renal lesion 03/27/2017   Nausea vomiting and diarrhea 03/27/2017   Abdominal pain 03/27/2017   Mixed dyslipidemia 08/10/2016   Multinodular goiter (nontoxic) 08/10/2016   Vitamin D insufficiency 08/10/2016   Mobitz type II atrioventricular block 04/21/2015   Pain in the chest    Chest pain 04/20/2015   GERD (gastroesophageal reflux disease) 04/20/2015   HX: breast cancer 09/10/2013   Diverticulitis 03/31/2013   Vasovagal near syncope 03/31/2013   Visual disturbance, transient 01/16/2013   Hypertension 01/16/2013   Hyperlipidemia 01/16/2013   Cerebrovascular disease 01/16/2013   Essential tremor 01/16/2013   Past Medical History:  Diagnosis Date   Abnormal Pap smear of cervix    Arthritis    Breast cancer (HCC)    Right breast, status post XRT   Cataracts, both eyes    Chest pain 2015   recent episode while in sun- recovered in shade with water   Colon polyp    Depression    Diverticulitis    Diverticulosis    Elevated LDL cholesterol level    Essential tremor    Goiter    multinodular   Hyperlipidemia    Hypertension    Osteopenia 12/1995   Spine   Ovarian cyst    found on CT scan   Post-menopausal bleeding 01/2000   Huge polyps exc   Presence of permanent cardiac pacemaker    Syncope    Vitamin D deficiency     Past Surgical History:  Procedure Laterality Date   Bilateral cateract surgery  2014   BREAST LUMPECTOMY Right 4/96   HYSTEROSCOPY  0/01   LAPAROSCOPIC BILATERAL  SALPINGO OOPHERECTOMY Bilateral 01/07/2014   Procedure: LAPAROSCOPIC BILATERAL SALPINGO OOPHORECTOMY;  Surgeon: Annamaria Boots, MD;  Location: [facility];  Service: Gynecology;  Laterality: Bilateral;   PACEMAKER IMPLANT N/A 05/26/2020   Procedure: PACEMAKER IMPLANT;  Surgeon: Lanier Prude, MD;  Location: [facility] INVASIVE CV LAB;  Service: Cardiovascular;  Laterality: N/A;    Medications Prior to Admission  Medication Sig Dispense Refill Last Dose   amLODipine (NORVASC) 5 MG tablet Take 1 tablet (5 mg total) by mouth daily. 90 tablet 3 02/03/2023   atorvastatin (LIPITOR) 10 MG tablet Take 1 tablet (10 mg total) by mouth every morning. 90 tablet 2 02/03/2023   Calcium Citrate-Vitamin D (CITRACAL PETITES/VITAMIN D PO) Take 2 tablets by mouth 2 (two) times daily.   02/02/2023   carboxymethylcellulose (REFRESH PLUS) 0.5 % SOLN Place 1 drop into both eyes 2 (two) times daily.   02/02/2023   ciprofloxacin (CIPRO) 250 MG tablet Take 250 mg by mouth 2 (two) times daily between meals as needed (UTI).      clonazePAM (KLONOPIN) 0.5 MG tablet Take 0.25 mg by mouth at bedtime.   02/02/2023   Flaxseed, Linseed, (SM FLAX SEED OIL) 1000 MG CAPS Take 1,000 mg by mouth every morning.    02/02/2023   ibandronate (BONIVA) 150 MG tablet Take 150 mg by mouth every 30 (thirty) days.  02/02/2023   Lactobacillus Rhamnosus, GG, (CULTURELLE PO) Take 1 capsule by mouth every morning.    02/02/2023   Multiple Vitamin (MULTIVITAMIN WITH MINERALS) TABS tablet Take 1 tablet by mouth every morning.   02/02/2023   polyethylene glycol powder (GLYCOLAX/MIRALAX) powder Take 17 g by mouth daily as needed for moderate constipation or severe constipation.   02/02/2023   primidone (MYSOLINE) 50 MG tablet Take 3 tablets (150 mg total) by mouth daily with breakfast.   02/03/2023   propranolol (INDERAL) 40 MG tablet Take 1 tablet (40 mg total) by mouth 2 (two) times daily. 180 tablet 3 02/03/2023   valsartan (DIOVAN) 320 MG tablet Take 1 tablet (320  mg total) by mouth every morning. 90 tablet 2 02/02/2023   HYDROcodone-acetaminophen (NORCO/VICODIN) 5-325 MG tablet Take 1 tablet by mouth every 6 (six) hours as needed for moderate pain. 20 tablet 0    omeprazole (PRILOSEC) 20 MG capsule Take 20 mg by mouth See admin instructions. Take 20 mg every day for two weeks every four months      ondansetron (ZOFRAN) 4 MG tablet Take 1 tablet (4 mg total) by mouth every 8 (eight) hours as needed for nausea or vomiting. (Patient not taking: Reported on 01/31/2023) 30 tablet 0 Not Taking   oxyCODONE (ROXICODONE) 5 MG immediate release tablet Take 1 tablet (5 mg total) by mouth every 6 (six) hours as needed for moderate pain. (Patient not taking: Reported on 01/31/2023) 20 tablet 0 Not Taking   Allergies  Allergen Reactions   Clonidine Other (See Comments)    Fatigue drymouth   Hydrocodone-Acetaminophen Nausea Only   Latex Itching and Rash   Bactrim [Sulfamethoxazole-Trimethoprim] Rash   Flagyl [Metronidazole] Hives, Nausea And Vomiting, Nausea Only and Rash   Penicillins Hives and Rash    Reaction: 2 year ago   Shingrix [Zoster Vac Recomb Adjuvanted] Other (See Comments)    Chills, nausea, diarrhea, and lightheadedness after second dose    Social History   Tobacco Use   Smoking status: Never   Smokeless tobacco: Never  Substance Use Topics   Alcohol use: No    Alcohol/week: 0.0 standard drinks of alcohol    Family History  Problem Relation Age of Onset   Heart disease Mother        Died at 36 of MI - says doctors thought it was due to mistreatment of thyroid dz in youth   Thyroid disease Mother    Heart attack Mother    Alzheimer's disease Father    Thyroid disease Maternal Grandmother    Stroke Maternal Grandmother      Review of Systems Pertinent items are noted in HPI.  Objective:   Patient Vitals for the past 8 hrs:  BP Temp Temp src Pulse Resp SpO2 Height Weight  02/03/23 1115 (!) 163/90 97.8 F (36.6 C) Oral 85 16 98 % 5\' 4"   (1.626 m) 62.9 kg   No intake/output data recorded. No intake/output data recorded.      General : Alert, cooperative, no distress, appears stated age   Head:  Normocephalic/atraumatic    Eyes: PERRL, conjunctiva/corneas clear, EOM's intact. Fundi could not be visualized Neck: Supple Chest:  Respirations unlabored Chest wall: no tenderness or deformity Heart: Regular rate and rhythm Abdomen: Soft, nontender and nondistended Extremities: warm and well-perfused Skin: normal turgor, color and texture Neurologic:  Alert, oriented x 3.  Eyes open spontaneously. PERRL, EOMI, VFC, no facial droop. V1-3 intact.  No dysarthria though speech scanning, tongue protrusion  symmetric.  CNII-XII intact. Normal strength, sensation and reflexes throughout.  Left pronator drift, full strength in legs       Data ReviewCBC:  Lab Results  Component Value Date   WBC 7.6 01/07/2023   RBC 4.83 01/07/2023   BMP:  Lab Results  Component Value Date   GLUCOSE 94 02/03/2023   CO2 23 01/07/2023   BUN 20 02/03/2023   BUN 18 05/20/2020   CREATININE 0.80 02/03/2023   CALCIUM 8.9 01/07/2023   Radiology review:  See clinic note for details  Assessment:   Active Problems:   * No active hospital problems. *  R chronic SDH Plan:   - burr holes today

## 2023-02-03 NOTE — Anesthesia Procedure Notes (Signed)
Procedure Name: Intubation Date/Time: 02/03/2023 1:58 PM  Performed by: Thomasene Ripple, CRNAPre-anesthesia Checklist: Patient identified, Emergency Drugs available, Suction available and Patient being monitored Patient Re-evaluated:Patient Re-evaluated prior to induction Oxygen Delivery Method: Circle System Utilized Preoxygenation: Pre-oxygenation with 100% oxygen Induction Type: IV induction Ventilation: Mask ventilation without difficulty Laryngoscope Size: Miller and 2 Grade View: Grade I Tube type: Oral Number of attempts: 1 Airway Equipment and Method: Stylet and Oral airway Placement Confirmation: ETT inserted through vocal cords under direct vision, positive ETCO2 and breath sounds checked- equal and bilateral Secured at: 21 cm Tube secured with: Tape Dental Injury: Teeth and Oropharynx as per pre-operative assessment

## 2023-02-03 NOTE — Op Note (Signed)
Procedure(s): [employer] Procedure Note    Procedure(s) and Anesthesia Type:    Right sided burr holes and placement of subdural drain for evacuation of subdural hematoma  Surgeon(s) and Role:    Maisie Fus, Coy Saunas, MD - Primary    Indications: This is a 88 yo F who developed a large right chronic SDH with symptoms of walking difficulty, dizziness, cognitive decline..  Risks, benefits, alternatives, and expected convalescence were discussed with her and her family.  Risks discussed included but were not limited to bleeding, pain, infection, seizure, stroke, scar, subdural recurrence, neurologic deficit, coma, and death.  Informed consent was obtained.  Surgeon: Bedelia Person   Assistants: Patrici Ranks, PA.  She  provided critical assistance throughout the entirety of the case.  No qualified trainees were available to assist with the procedure.  Anesthesia: General endotracheal anesthesia   Procedure in detail:  The patient was brought to the operating room.  A timeout was performed.  General anesthesia was induced and patient was intubated by the anesthesia service.  After appropriate lines and monitors were placed, patient's head was turned to the left and scalp was shaved, preprepped with alcohol and cleansing solution and prepped and draped in sterile fashion.  1% lidocaine with epinephrine injected into the planned incisions.  2 incisions were made with a 10 blade just above the superior temporal line, one in the frontal region and one in the parietal region.  The periosteum was swept off the skull and self-retaining retractors were placed.  Perforator was used to prep performed bur holes at each incision.  The dura was then coagulated and opened in cruciate manner.  Superficial membrane was coagulated and opened at each bur hole and chronic subdural fluid was encountered.  The subdural space was irrigated thoroughly with good communication between the openings and the irrigation  returned clear.  The brain was inspected and appeared to be reexpanding appropriately.  However, as it had not fully returned from its sunken status, an EVD catheter was used as a subdural drain and placed in the posterior bur hole and turned out the skin and secured with a stitch.  The dural defects were covered with Gelfoam and the subdural space was irrigated one last time.  The incisions were closed with 2-0 Vicryl stitches and staples.  The subdural drain was connected to a drainage bag.    Sterile dressing was then placed on the incision.  Patient was then extubated by the anesthesia service moving all extremities.  All counts were correct at the end of surgery.  No complications were noted.  Findings: Large chronic subdural hematoma  Estimated Blood Loss:  < 10 ml         Drains: subdural drain  Specimens: none         Implants: none        Complications:  none         Disposition: To PACU         Condition: stable

## 2023-02-03 NOTE — Progress Notes (Signed)
Pt noted to only have about 3 cc of drainage from ventriculostomy in last 2 hrs. Dr. Franky Macho contacted via receptionist (Neurosurgery). Pt w/o neuro change at this time. Waiting on return call. Will conti to monitor pt.

## 2023-02-03 NOTE — Progress Notes (Signed)
Pharmacy Antibiotic Note  Michelle Fuller is a 88 y.o. female admitted on 02/03/2023 to begin vancomycin for prophylaxis following SDH evacuation with a drain in place.  Received Vancomycin 1 gram at 11:30 am  Plan: Vancomycin 750 mg iv Q 24 hours  (AUC of 496 using Scr 1)  Follow Scr, progress, LOT  Height: 5\' 4"  (162.6 cm) Weight: 62.9 kg (138 lb 10.7 oz) IBW/kg (Calculated) : 54.7  Temp (24hrs), Avg:97.8 F (36.6 C), Min:97.7 F (36.5 C), Max:97.8 F (36.6 C)  Recent Labs  Lab 02/03/23 1138  CREATININE 0.80    Estimated Creatinine Clearance: 43.6 mL/min (by C-G formula based on SCr of 0.8 mg/dL).    Allergies  Allergen Reactions   Clonidine Other (See Comments)    Fatigue drymouth   Hydrocodone-Acetaminophen Nausea Only   Latex Itching and Rash   Bactrim [Sulfamethoxazole-Trimethoprim] Rash   Flagyl [Metronidazole] Hives, Nausea And Vomiting, Nausea Only and Rash   Penicillins Hives and Rash    Reaction: 2 year ago   Shingrix [Zoster Vac Recomb Adjuvanted] Other (See Comments)    Chills, nausea, diarrhea, and lightheadedness after second dose    Thank you for allowing pharmacy to be a part of this patient's care. Okey Regal, PharmD 02/03/2023 5:54 PM

## 2023-02-03 NOTE — Anesthesia Postprocedure Evaluation (Signed)
Anesthesia Post Note  Patient: Michelle Fuller  Procedure(s) Performed: Ezekiel Ina (Right)     Patient location during evaluation: PACU Anesthesia Type: General Level of consciousness: awake and alert Pain management: pain level controlled Vital Signs Assessment: post-procedure vital signs reviewed and stable Respiratory status: spontaneous breathing, nonlabored ventilation, respiratory function stable and patient connected to nasal cannula oxygen Cardiovascular status: blood pressure returned to baseline and stable Postop Assessment: no apparent nausea or vomiting Anesthetic complications: no   No notable events documented.  Last Vitals:  Vitals:   02/03/23 1630 02/03/23 1700  BP: 138/79 137/74  Pulse: 81 86  Resp: 19 17  Temp:    SpO2: 95% 97%    Last Pain:  Vitals:   02/03/23 1700  TempSrc:   PainSc: 0-No pain                 [place]

## 2023-02-04 ENCOUNTER — Inpatient Hospital Stay (HOSPITAL_COMMUNITY): Payer: Medicare PPO

## 2023-02-04 MED ORDER — POLYVINYL ALCOHOL 1.4 % OP SOLN: 1.0000 [drp] | OPHTHALMIC | Status: DC | PRN

## 2023-02-04 NOTE — Evaluation (Signed)
Occupational Therapy Evaluation Patient Details Name: Michelle Fuller MRN: [id number] DOB: [birth year] Today's Date: 02/04/2023   History of Present Illness Pt is an 88 y.o. female who presented 02/03/23 for elective R-sided burr holes and placement of subdural drain for evacuation of SDH. Pt fell in August 2024 and sustained a R SDH (stable) and has since developed poor ambulatory ability, low energy, and functional loss. PMH includes: HTN, breast cancer s/p radiation therapy, cataracts, depression, HLD, HTN, and pacemaker placement 2022.   Clinical Impression   Prior to this admission, patient moved to [facility]F and has been using a RW and needing assistance for lower body ADLs  (patient had hired a caregiver to assist in the mornings) Prior to her SDH, patient did tai chi and was independent without DME, living alone in a house. Patient's goal is to gain back her independence. Currently, Patient presenting with decreased STM, decreased strength in LUE and LLE, and need for mod A in order to complete ADLs. Patient min A for functional mobility and decreased activity tolerance and endurance. OT recommending HHOT services at discharge, OT will continue to follow acutely.       If plan is discharge home, recommend the following: A little help with walking and/or transfers;A lot of help with bathing/dressing/bathroom;Direct supervision/assist for medications management;Direct supervision/assist for financial management;Assist for transportation;Help with stairs or ramp for entrance;Assistance with cooking/housework;Supervision due to cognitive status    Functional Status Assessment  Patient has had a recent decline in their functional status and demonstrates the ability to make significant improvements in function in a reasonable and predictable amount of time.  Equipment Recommendations  None recommended by OT    Recommendations for Other Services       Precautions / Restrictions  Precautions Precautions: Fall Precaution Comments: EVD (drain to gravity, no clamp needed per RN, ensure it hangs at shoulder height) Restrictions Weight Bearing Restrictions: No      Mobility Bed Mobility Overal bed mobility: Needs Assistance Bed Mobility: Supine to Sit     Supine to sit: Contact guard, HOB elevated     General bed mobility comments: HOB elevated, CGA for safety    Transfers Overall transfer level: Needs assistance Equipment used: Rolling walker (2 wheels) Transfers: Sit to/from Stand Sit to Stand: Contact guard assist, +2 safety/equipment           General transfer comment: Extra time to power up to stand with pt pulling up on RW, but no LOB, CGA for safety. Cued pt to reach back for chair prior to sitting with fair carry through      Balance Overall balance assessment: Needs assistance Sitting-balance support: No upper extremity supported, Feet supported Sitting balance-Leahy Scale: Good     Standing balance support: Bilateral upper extremity supported, No upper extremity supported, During functional activity, Reliant on assistive device for balance Standing balance-Leahy Scale: Poor Standing balance comment: Reliant on RW or minA for standing balance                           ADL either performed or assessed with clinical judgement   ADL Overall ADL's : Needs assistance/impaired Eating/Feeding: Set up;Sitting   Grooming: Set up;Sitting   Upper Body Bathing: Minimal assistance;Sitting   Lower Body Bathing: Sitting/lateral leans;Sit to/from stand;Maximal assistance   Upper Body Dressing : Minimal assistance;Sitting   Lower Body Dressing: Maximal assistance;Sitting/lateral leans;Sit to/from stand   Toilet Transfer: Minimal assistance;Ambulation;Rolling walker (2 wheels)  Toileting- Clothing Manipulation and Hygiene: Minimal assistance;Sitting/lateral lean;Sit to/from stand       Functional mobility during ADLs: Moderate  assistance;Cueing for safety;Rolling walker (2 wheels) General ADL Comments: Patient presenting with decreased STM, decreased strength in LUE and LLE, and need for mod A in order to complete ADLs. Patient min A for functional mobility.     Vision Baseline Vision/History: 1 Wears glasses Ability to See in Adequate Light: 0 Adequate Patient Visual Report: No change from baseline Vision Assessment?: Yes Eye Alignment: Within Functional Limits Ocular Range of Motion: Within Functional Limits Alignment/Gaze Preference: Within Defined Limits Tracking/Visual Pursuits: Able to track stimulus in all quads without difficulty Saccades: Within functional limits Convergence: Within functional limits Visual Fields: No apparent deficits     Perception Perception: Within Functional Limits       Praxis Praxis: WFL       Pertinent Vitals/Pain Pain Assessment Pain Assessment: Faces Faces Pain Scale: Hurts a little bit Pain Location: generalized Pain Descriptors / Indicators: Discomfort Pain Intervention(s): Limited activity within patient's tolerance, Monitored during session, Repositioned     Extremity/Trunk Assessment Upper Extremity Assessment Upper Extremity Assessment: LUE deficits/detail LUE Deficits / Details: decreased strength 4/5 in LUE, no changes in sensation LUE Sensation: WNL LUE Coordination: decreased fine motor;decreased gross motor   Lower Extremity Assessment Lower Extremity Assessment: LLE deficits/detail LLE Deficits / Details: mild weakness with ankle dorsiflexion, MMT scores of 4+ bil hip flexion, 5 bil knee extension, 5 R ankle dorsiflexion and 4+ L ankle dorsiflexion; able to detect touch accurately bil LLE Coordination: decreased gross motor   Cervical / Trunk Assessment Cervical / Trunk Assessment: Normal   Communication Communication Communication: No apparent difficulties   Cognition Arousal: Alert Behavior During Therapy: WFL for tasks  assessed/performed Overall Cognitive Status: Impaired/Different from baseline Area of Impairment: Memory, Safety/judgement, Problem solving                     Memory: Decreased short-term memory   Safety/Judgement: Decreased awareness of deficits   Problem Solving: Slow processing General Comments: Pt slow to process cues at times. Pt thought these therapists were the ones that did her assessment at her home after last admission, demonstrating memory deficits. Pt still not comprehending these therapists are not HH therapists at end of session. ABle to count backwards by 2s from 20 and by 3s from 21 without mistake while ambulating     General Comments  VSS on RA; hooked drain to gown at R shoulder level for session per RN request    Exercises     Shoulder Instructions      Home Living Family/patient expects to be discharged to:: Assisted living [facility])                             Home Equipment: Agricultural consultant (2 wheels)   Additional Comments: recently moved to an ALF and hired a caregiver to assist her in the mornings; prior to SDH she was independent and living along in her home      Prior Functioning/Environment Prior Level of Function : Needs assist             Mobility Comments: recently has been using a RW for mobility; Prior to her SDH, pt did tai chi and was independent without DME ADLs Comments: recently has been needing assistance for lower body ADLs; prior to SDH she was independent; she wears glasses  OT Problem List: Decreased strength;Decreased range of motion;Decreased activity tolerance;Impaired balance (sitting and/or standing);Decreased coordination;Decreased cognition;Decreased safety awareness;Decreased knowledge of use of DME or AE;Decreased knowledge of precautions;Pain;Impaired UE functional use      OT Treatment/Interventions: Self-care/ADL training;Therapeutic exercise;Neuromuscular education;Energy conservation;DME  and/or AE instruction;Therapeutic activities;Cognitive remediation/compensation;Patient/family education;Balance training    OT Goals(Current goals can be found in the care plan section) Acute Rehab OT Goals Patient Stated Goal: to get back to walking without using a RW OT Goal Formulation: With patient Time For Goal Achievement: 02/18/23 Potential to Achieve Goals: Good  OT Frequency: Min 1X/week    Co-evaluation   Reason for Co-Treatment: For patient/therapist safety;To address functional/ADL transfers;Other (comment) (unsure if would tolerate x2 sessions per discussion with RN) PT goals addressed during session: Mobility/safety with mobility;Balance;Proper use of DME        AM-PAC OT "6 Clicks" Daily Activity     Outcome Measure Help from another person eating meals?: A Little Help from another person taking care of personal grooming?: A Little Help from another person toileting, which includes using toliet, bedpan, or urinal?: A Little Help from another person bathing (including washing, rinsing, drying)?: A Lot Help from another person to put on and taking off regular upper body clothing?: A Little Help from another person to put on and taking off regular lower body clothing?: A Lot 6 Click Score: 16   End of Session Equipment Utilized During Treatment: Gait belt;Rolling walker (2 wheels) Nurse Communication: Mobility status  Activity Tolerance: Patient tolerated treatment well Patient left: in chair;with call bell/phone within reach;with chair alarm set  OT Visit Diagnosis: Unsteadiness on feet (R26.81);Other abnormalities of gait and mobility (R26.89);Repeated falls (R29.6);Muscle weakness (generalized) (M62.81);History of falling (Z91.81);Other symptoms and signs involving cognitive function;Pain;Dizziness and giddiness (R42)                Time: 1478-2956 OT Time Calculation (min): 25 min Charges:  OT General Charges $OT Visit: 1 Visit OT Evaluation $OT Eval Moderate  Complexity: 1 Mod  Pollyann Glen E. Emersyn Kotarski, OTR/L Acute Rehabilitation Services [phone]   Cherlyn Cushing 02/04/2023, 2:37 PM

## 2023-02-04 NOTE — Progress Notes (Signed)
Pt transported to and from CT with transport and this RN with no events.   Robina Ade, RN

## 2023-02-04 NOTE — Progress Notes (Signed)
    Providing Compassionate, Quality Care - Together   NEUROSURGERY PROGRESS NOTE     S: No issues overnight.    O: EXAM:  BP (!) 159/73   Pulse 75   Temp 98.1 F (36.7 C) (Oral)   Resp 17   Ht 5\' 4"  (1.626 m)   Wt 69.5 kg   LMP 05/25/1999   SpO2 96%   BMI 26.30 kg/m     Awake, alert, oriented  Speech fluent, appropriate  CNs grossly intact  Dressing c/d/I Subdural drain intact   ASSESSMENT:  88 y.o. with R chronic SDH, s/p R sided burr holes for evacuation, POD#1    PLAN: -Continue subdural drain -Therapies as tolerated -Call w/ questions/concerns.   Patrici Ranks, [facility]

## 2023-02-04 NOTE — Evaluation (Signed)
Physical Therapy Evaluation Patient Details Name: Michelle Fuller MRN: [id number] DOB: 1934-12-30 Today's Date: 02/04/2023  History of Present Illness  Pt is an 88 y.o. female who presented 02/03/23 for elective R-sided burr holes and placement of subdural drain for evacuation of SDH. Pt fell in August 2024 and sustained a R SDH (stable) and has since developed poor ambulatory ability, low energy, and functional loss. PMH includes: HTN, breast cancer s/p radiation therapy, cataracts, depression, HLD, HTN, and pacemaker placement 2022.   Clinical Impression  Pt presents with condition above and deficits mentioned below, see PT Problem List. Most recently, pt moved to [facility] and has been using a RW and needing assistance for lower body ADLs. Prior to her SDH, pt did tai chi and was independent without DME, living alone in a house. Pt's goal is to gain back her independence. Currently, pt demonstrates deficits in memory, awareness, processing speed, L ankle dorsiflexion strength, gross coordination, balance, and activity tolerance. She is able to mobilize without LOB at a CGA level only for safety when utilizing a RW. She needs minA to maintain her balance when trying to ambulate without it, placing her at risk for falls. Recommending follow-up with HHPT upon d/c back to her facility. Will continue to follow acutely.      If plan is discharge home, recommend the following: A little help with walking and/or transfers;A little help with bathing/dressing/bathroom;Assistance with cooking/housework;Direct supervision/assist for medications management;Direct supervision/assist for financial management;Assist for transportation;Help with stairs or ramp for entrance   Can travel by private vehicle        Equipment Recommendations BSC/3in1  Recommendations for Other Services       Functional Status Assessment Patient has had a recent decline in their functional status and demonstrates the ability  to make significant improvements in function in a reasonable and predictable amount of time.     Precautions / Restrictions Precautions Precautions: Fall Precaution Comments: EVD (drain to gravity, no clamp needed per RN, ensure it hangs at shoulder height) Restrictions Weight Bearing Restrictions: No      Mobility  Bed Mobility Overal bed mobility: Needs Assistance Bed Mobility: Supine to Sit     Supine to sit: Contact guard, HOB elevated     General bed mobility comments: HOB elevated, CGA for safety    Transfers Overall transfer level: Needs assistance Equipment used: Rolling walker (2 wheels) Transfers: Sit to/from Stand Sit to Stand: Contact guard assist, +2 safety/equipment           General transfer comment: Extra time to power up to stand with pt pulling up on RW, but no LOB, CGA for safety. Cued pt to reach back for chair prior to sitting with fair carry through    Ambulation/Gait Ambulation/Gait assistance: Contact guard assist, Min assist Gait Distance (Feet): 160 Feet Assistive device: Rolling walker (2 wheels), None Gait Pattern/deviations: Step-through pattern, Decreased stride length, Staggering left, Narrow base of support Gait velocity: reduced Gait velocity interpretation: 1.31 - 2.62 ft/sec, indicative of limited community ambulator (with RW)   General Gait Details: Pt ambulates quickly and smoothly without LOB when utilizing the RW, CGA for safety. When attempting to ambulate without UE support, she tends to place her feet narrowly and stagger to the L, needing minA for balance.  Stairs            Wheelchair Mobility     Tilt Bed    Modified Rankin (Stroke Patients Only)       Balance  Overall balance assessment: Needs assistance Sitting-balance support: No upper extremity supported, Feet supported Sitting balance-Leahy Scale: Good     Standing balance support: Bilateral upper extremity supported, No upper extremity supported,  During functional activity, Reliant on assistive device for balance Standing balance-Leahy Scale: Poor Standing balance comment: Reliant on RW or minA for standing balance                             Pertinent Vitals/Pain Pain Assessment Pain Assessment: Faces Faces Pain Scale: Hurts a little bit Pain Location: generalized Pain Descriptors / Indicators: Discomfort Pain Intervention(s): Limited activity within patient's tolerance, Monitored during session, Repositioned    Home Living Family/patient expects to be discharged to:: Assisted living ([facility])                 Home Equipment: Agricultural consultant (2 wheels) Additional Comments: recently moved to an ALF and hired a caregiver to assist her in the mornings; prior to SDH she was independent and living along in her home    Prior Function Prior Level of Function : Needs assist             Mobility Comments: recently has been using a RW for mobility; Prior to her SDH, pt did tai chi and was independent without DME ADLs Comments: recently has been needing assistance for lower body ADLs; prior to SDH she was independent; she wears glasses     Extremity/Trunk Assessment   Upper Extremity Assessment Upper Extremity Assessment: Defer to OT evaluation    Lower Extremity Assessment Lower Extremity Assessment: LLE deficits/detail LLE Deficits / Details: mild weakness with ankle dorsiflexion, MMT scores of 4+ bil hip flexion, 5 bil knee extension, 5 R ankle dorsiflexion and 4+ L ankle dorsiflexion; able to detect touch accurately bil LLE Coordination: decreased gross motor       Communication   Communication Communication: No apparent difficulties  Cognition Arousal: Alert Behavior During Therapy: WFL for tasks assessed/performed Overall Cognitive Status: Impaired/Different from baseline Area of Impairment: Memory, Safety/judgement, Problem solving                     Memory: Decreased short-term  memory   Safety/Judgement: Decreased awareness of deficits   Problem Solving: Slow processing General Comments: Pt slow to process cues at times. Pt thought these therapists were the ones that did her assessment at her home after last admission, demonstrating memory deficits. Pt still not comprehending these therapists are not HH therapists at end of session. ABle to count backwards by 2s from 20 and by 3s from 21 without mistake while ambulating        General Comments General comments (skin integrity, edema, etc.): VSS on RA; hooked drain to gown at R shoulder level for session per RN request    Exercises     Assessment/Plan    PT Assessment Patient needs continued PT services  PT Problem List Decreased strength;Decreased activity tolerance;Decreased balance;Decreased mobility;Decreased coordination;Decreased cognition       PT Treatment Interventions DME instruction;Gait training;Functional mobility training;Therapeutic activities;Therapeutic exercise;Balance training;Neuromuscular re-education;Cognitive remediation;Patient/family education    PT Goals (Current goals can be found in the Care Plan section)  Acute Rehab PT Goals Patient Stated Goal: to get back to walking without DME PT Goal Formulation: With patient Time For Goal Achievement: 02/18/23 Potential to Achieve Goals: Good    Frequency Min 1X/week     Co-evaluation PT/OT/SLP Co-Evaluation/Treatment: Yes Reason for Co-Treatment: For  patient/therapist safety;To address functional/ADL transfers;Other (comment) (unsure if would tolerate x2 sessions per discussion with RN) PT goals addressed during session: Mobility/safety with mobility;Balance;Proper use of DME         AM-PAC PT "6 Clicks" Mobility  Outcome Measure Help needed turning from your back to your side while in a flat bed without using bedrails?: None Help needed moving from lying on your back to sitting on the side of a flat bed without using  bedrails?: A Little Help needed moving to and from a bed to a chair (including a wheelchair)?: A Little Help needed standing up from a chair using your arms (e.g., wheelchair or bedside chair)?: A Little Help needed to walk in hospital room?: A Little Help needed climbing 3-5 steps with a railing? : A Little 6 Click Score: 19    End of Session Equipment Utilized During Treatment: Gait belt Activity Tolerance: Patient tolerated treatment well Patient left: in chair;with call bell/phone within reach;with chair alarm set Nurse Communication: Mobility status (NT) PT Visit Diagnosis: Unsteadiness on feet (R26.81);Other abnormalities of gait and mobility (R26.89);Muscle weakness (generalized) (M62.81);Difficulty in walking, not elsewhere classified (R26.2);Other symptoms and signs involving the nervous system (R29.898)    Time: 3244-0102 PT Time Calculation (min) (ACUTE ONLY): 25 min   Charges:   PT Evaluation $PT Eval Moderate Complexity: 1 Mod   PT General Charges $$ ACUTE PT VISIT: 1 Visit         Virgil Benedict, PT, DPT Acute Rehabilitation Services  Office: [phone]   Bettina Gavia 02/04/2023, 1:37 PM

## 2023-02-04 NOTE — Plan of Care (Signed)
  Problem: Education: Goal: Knowledge of the prescribed therapeutic regimen will improve Outcome: Progressing   Problem: Clinical Measurements: Goal: Usual level of consciousness will be regained or maintained. Outcome: Progressing Goal: Ability to maintain intracranial pressure will improve Outcome: Progressing   Problem: Skin Integrity: Goal: Demonstration of wound healing without infection will improve Outcome: Progressing

## 2023-02-04 NOTE — Progress Notes (Signed)
Pt seen and examined.  Awake, alert, speech fluent, no drift.  - likely dc drain, dc home tomorrow

## 2023-02-05 ENCOUNTER — Encounter (HOSPITAL_COMMUNITY): Payer: Self-pay | Admitting: Neurosurgery

## 2023-02-05 NOTE — Plan of Care (Signed)
Problem: Education: Goal: Knowledge of the prescribed therapeutic regimen will improve Outcome: Progressing   Problem: Clinical Measurements: Goal: Usual level of consciousness will be regained or maintained. Outcome: Progressing Goal: Neurologic status will improve Outcome: Progressing Goal: Ability to maintain intracranial pressure will improve Outcome: Progressing   Problem: Skin Integrity: Goal: Demonstration of wound healing without infection will improve Outcome: Progressing   Problem: Education: Goal: Knowledge of General Education information will improve Description: Including pain rating scale, medication(s)/side effects and non-pharmacologic comfort measures Outcome: Progressing   Problem: Health Behavior/Discharge Planning: Goal: Ability to manage health-related needs will improve Outcome: Progressing   Problem: Clinical Measurements: Goal: Ability to maintain clinical measurements within normal limits will improve Outcome: Progressing Goal: Will remain free from infection Outcome: Progressing Goal: Diagnostic test results will improve Outcome: Progressing Goal: Respiratory complications will improve Outcome: Progressing Goal: Cardiovascular complication will be avoided Outcome: Progressing   Problem: Activity: Goal: Risk for activity intolerance will decrease Outcome: Progressing   Problem: Nutrition: Goal: Adequate nutrition will be maintained Outcome: Progressing   Problem: Coping: Goal: Level of anxiety will decrease Outcome: Progressing   Problem: Elimination: Goal: Will not experience complications related to bowel motility Outcome: Progressing Goal: Will not experience complications related to urinary retention Outcome: Progressing   Problem: Pain Managment: Goal: General experience of comfort will improve Outcome: Progressing   Problem: Safety: Goal: Ability to remain free from injury will improve Outcome: Progressing   Problem:  Skin Integrity: Goal: Risk for impaired skin integrity will decrease Outcome: Progressing

## 2023-02-05 NOTE — Progress Notes (Signed)
   Providing Compassionate, Quality Care - Together  NEUROSURGERY PROGRESS NOTE   S: No issues overnight.   O: EXAM:  BP (!) 177/76 (BP Location: Left Arm)   Pulse 75   Temp 98.2 F (36.8 C) (Oral)   Resp (!) 23   Ht 5\' 4"  (1.626 m)   Wt 69.5 kg   LMP 05/25/1999   SpO2 94%   BMI 26.30 kg/m   Awake, alert, oriented  PERRL Speech fluent, appropriate  CNs grossly intact  5/5 BUE/BLE  Subdural drain in place Dressing clean dry and intact  ASSESSMENT:  88 y.o. female with   Right chronic subdural status post bur hole evacuation  PLAN: -Drain DC'd without difficulty -Transfer to floor -PT/OT, may benefit from rehab    Thank you for allowing me to participate in this patient's care.  Please do not hesitate to call with questions or concerns.   Monia Pouch, DO Neurosurgeon [facility] [phone]

## 2023-02-05 NOTE — Plan of Care (Signed)
  Problem: Education: Goal: Knowledge of the prescribed therapeutic regimen will improve 02/05/2023 1719 by Aretta Nip, RN Outcome: Progressing 02/05/2023 1714 by Aretta Nip, RN Outcome: Progressing   Problem: Clinical Measurements: Goal: Usual level of consciousness will be regained or maintained. 02/05/2023 1719 by Aretta Nip, RN Outcome: Progressing 02/05/2023 1714 by Aretta Nip, RN Outcome: Progressing Goal: Neurologic status will improve 02/05/2023 1719 by Aretta Nip, RN Outcome: Progressing 02/05/2023 1714 by Aretta Nip, RN Outcome: Progressing Goal: Ability to maintain intracranial pressure will improve 02/05/2023 1719 by Aretta Nip, RN Outcome: Progressing 02/05/2023 1714 by Aretta Nip, RN Outcome: Progressing   Problem: Skin Integrity: Goal: Demonstration of wound healing without infection will improve 02/05/2023 1719 by Aretta Nip, RN Outcome: Progressing 02/05/2023 1714 by Aretta Nip, RN Outcome: Progressing   Problem: Education: Goal: Knowledge of General Education information will improve Description: Including pain rating scale, medication(s)/side effects and non-pharmacologic comfort measures 02/05/2023 1719 by Aretta Nip, RN Outcome: Progressing 02/05/2023 1714 by Aretta Nip, RN Outcome: Progressing   Problem: Health Behavior/Discharge Planning: Goal: Ability to manage health-related needs will improve 02/05/2023 1719 by Aretta Nip, RN Outcome: Progressing 02/05/2023 1714 by Aretta Nip, RN Outcome: Progressing   Problem: Clinical Measurements: Goal: Ability to maintain clinical measurements within normal limits will improve 02/05/2023 1719 by Aretta Nip, RN Outcome: Progressing 02/05/2023 1714 by Aretta Nip, RN Outcome: Progressing Goal: Will remain free from infection 02/05/2023 1719 by Aretta Nip, RN Outcome: Progressing 02/05/2023 1714 by Aretta Nip, RN Outcome:  Progressing Goal: Diagnostic test results will improve 02/05/2023 1719 by Aretta Nip, RN Outcome: Progressing 02/05/2023 1714 by Aretta Nip, RN Outcome: Progressing Goal: Respiratory complications will improve 02/05/2023 1719 by Aretta Nip, RN Outcome: Progressing 02/05/2023 1714 by Aretta Nip, RN Outcome: Progressing Goal: Cardiovascular complication will be avoided 02/05/2023 1719 by Aretta Nip, RN Outcome: Progressing 02/05/2023 1714 by Aretta Nip, RN Outcome: Progressing   Problem: Activity: Goal: Risk for activity intolerance will decrease 02/05/2023 1719 by Aretta Nip, RN Outcome: Progressing 02/05/2023 1714 by Aretta Nip, RN Outcome: Progressing   Problem: Nutrition: Goal: Adequate nutrition will be maintained 02/05/2023 1719 by Aretta Nip, RN Outcome: Progressing 02/05/2023 1714 by Aretta Nip, RN Outcome: Progressing   Problem: Coping: Goal: Level of anxiety will decrease 02/05/2023 1719 by Aretta Nip, RN Outcome: Progressing 02/05/2023 1714 by Aretta Nip, RN Outcome: Progressing   Problem: Elimination: Goal: Will not experience complications related to bowel motility 02/05/2023 1719 by Aretta Nip, RN Outcome: Progressing 02/05/2023 1714 by Aretta Nip, RN Outcome: Progressing Goal: Will not experience complications related to urinary retention 02/05/2023 1719 by Aretta Nip, RN Outcome: Progressing 02/05/2023 1714 by Aretta Nip, RN Outcome: Progressing   Problem: Pain Managment: Goal: General experience of comfort will improve 02/05/2023 1719 by Aretta Nip, RN Outcome: Progressing 02/05/2023 1714 by Aretta Nip, RN Outcome: Progressing   Problem: Safety: Goal: Ability to remain free from injury will improve 02/05/2023 1719 by Aretta Nip, RN Outcome: Progressing 02/05/2023 1714 by Aretta Nip, RN Outcome: Progressing   Problem: Skin Integrity: Goal: Risk for impaired  skin integrity will decrease 02/05/2023 1719 by Aretta Nip, RN Outcome: Progressing 02/05/2023 1714 by Aretta Nip, RN Outcome: Progressing

## 2023-02-06 MED ORDER — AMLODIPINE BESYLATE 5 MG PO TABS
5.0000 mg | ORAL_TABLET | Freq: Once | ORAL | Status: AC
  Administered 2023-02-06: 5 mg via ORAL
  Filled 2023-02-06: qty 1

## 2023-02-06 MED ORDER — AMLODIPINE BESYLATE 10 MG PO TABS
10.0000 mg | ORAL_TABLET | Freq: Every day | ORAL | Status: DC
  Administered 2023-02-07: 10 mg via ORAL
  Filled 2023-02-06: qty 1

## 2023-02-06 NOTE — Plan of Care (Signed)
  Problem: Education: Goal: Knowledge of the prescribed therapeutic regimen will improve Outcome: Progressing   Problem: Clinical Measurements: Goal: Usual level of consciousness will be regained or maintained. Outcome: Progressing Goal: Neurologic status will improve Outcome: Progressing Goal: Ability to maintain intracranial pressure will improve Outcome: Progressing   Problem: Skin Integrity: Goal: Demonstration of wound healing without infection will improve Outcome: Progressing   Problem: Education: Goal: Knowledge of General Education information will improve Description: Including pain rating scale, medication(s)/side effects and non-pharmacologic comfort measures Outcome: Progressing   Problem: Health Behavior/Discharge Planning: Goal: Ability to manage health-related needs will improve Outcome: Progressing   Problem: Clinical Measurements: Goal: Ability to maintain clinical measurements within normal limits will improve Outcome: Progressing Goal: Will remain free from infection Outcome: Progressing Goal: Diagnostic test results will improve Outcome: Progressing Goal: Respiratory complications will improve Outcome: Progressing Goal: Cardiovascular complication will be avoided Outcome: Progressing   Problem: Activity: Goal: Risk for activity intolerance will decrease Outcome: Progressing   Problem: Nutrition: Goal: Adequate nutrition will be maintained Outcome: Progressing   Problem: Coping: Goal: Level of anxiety will decrease Outcome: Progressing   Problem: Elimination: Goal: Will not experience complications related to bowel motility Outcome: Progressing Goal: Will not experience complications related to urinary retention Outcome: Progressing   Problem: Pain Managment: Goal: General experience of comfort will improve Outcome: Progressing   Problem: Safety: Goal: Ability to remain free from injury will improve Outcome: Progressing   Problem:  Skin Integrity: Goal: Risk for impaired skin integrity will decrease Outcome: Progressing   

## 2023-02-06 NOTE — Progress Notes (Signed)
    Providing Compassionate, Quality Care - Together   NEUROSURGERY PROGRESS NOTE     S: No issues overnight.    O: EXAM:  BP (!) 160/73 (BP Location: Left Arm)   Pulse 74   Temp 98.4 F (36.9 C) (Axillary)   Resp 16   Ht 5\' 4"  (1.626 m)   Wt 69.5 kg   LMP 05/25/1999   SpO2 93%   BMI 26.30 kg/m     Awake, alert, oriented  Speech fluent, appropriate  CNs grossly intact  Strength intact Dressing c/d/i   ASSESSMENT:  88 y.o. with R chronic SDH s/p burr hole evacuation    PLAN: -Cont supportive care -CIR consult -Call w/ questions/concerns.   Patrici Ranks, [facility]

## 2023-02-07 ENCOUNTER — Encounter (HOSPITAL_COMMUNITY): Payer: Self-pay | Admitting: Neurosurgery

## 2023-02-07 NOTE — Care Management Important Message (Signed)
Important Message  Patient Details  Name: Michelle Fuller MRN: [id number] Date of Birth: [birth year]   Medicare Important Message Given:  Yes     Dorena Bodo 02/07/2023, 4:29 PM

## 2023-02-07 NOTE — Progress Notes (Signed)
Physical Therapy Treatment Patient Details Name: Michelle Fuller MRN: [id number] DOB: 20-Aug-1934 Today's Date: 02/07/2023   History of Present Illness Pt is an 88 y.o. female who presented 02/03/23 for elective R-sided burr holes and placement of subdural drain for evacuation of SDH. Pt fell in August 2024 and sustained a R SDH (stable) and has since developed poor ambulatory ability, low energy, and functional loss. PMH includes: HTN, breast cancer s/p radiation therapy, cataracts, depression, HLD, HTN, and pacemaker placement 2022.    PT Comments  Pt is progressing well with goals. Currently pt is Mod I with AD for sit to stand and gait. Pt was able to perform short distance gait without RW without LOB. Pt demonstrates good eccentric control of quads with sitting on toilet without UE support and slow lowering. Extra time was spent discussing with pt and daughter physical therapy recommendations on discharge and the importance of safety with use of AD at all times initially on discharge. Pt will benefit from continued skilled physical therapy services 3x/weekly on discharge from acute care hospital setting in order to work on activity tolerance, safety and balance to decrease risk for falls, injury and re-hospitalization.     If plan is discharge home, recommend the following: A little help with walking and/or transfers;A little help with bathing/dressing/bathroom;Assistance with cooking/housework;Direct supervision/assist for medications management;Direct supervision/assist for financial management;Assist for transportation;Help with stairs or ramp for entrance     Equipment Recommendations  BSC/3in1       Precautions / Restrictions Precautions Precautions: Fall Precaution Comments: drain removed Restrictions Weight Bearing Restrictions: No     Mobility  Bed Mobility   General bed mobility comments: pt received in recliner and returned to recliner at end of session.     Transfers Overall transfer level: Modified independent Equipment used: Rolling walker (2 wheels) Transfers: Sit to/from Stand, Bed to chair/wheelchair/BSC Sit to Stand: Modified independent (Device/Increase time)   Step pivot transfers: Modified independent (Device/Increase time)       General transfer comment: 1x verbal cue for reaching back for arm rest when going to sit. Pt sat on toilet and got up off toilet without UE support and good eccentric control    Ambulation/Gait Ambulation/Gait assistance: Modified independent (Device/Increase time) Gait Distance (Feet): 250 Feet Assistive device: Rolling walker (2 wheels), None Gait Pattern/deviations: Step-through pattern, Decreased stride length Gait velocity: reduced Gait velocity interpretation: 1.31 - 2.62 ft/sec, indicative of limited community ambulator   General Gait Details: Pt is able to ambulate well with RW, pace is reduced without RW and minimal arm swing, no loss of balance or staggering/veering with obstacles     Modified Rankin (Stroke Patients Only) Modified Rankin (Stroke Patients Only) Pre-Morbid Rankin Score: No significant disability Modified Rankin: No significant disability     Balance Overall balance assessment: Mild deficits observed, not formally tested Sitting-balance support: No upper extremity supported, Feet supported Sitting balance-Leahy Scale: Good     Standing balance support: No upper extremity supported, Bilateral upper extremity supported Standing balance-Leahy Scale: Good Standing balance comment: no LOB       Cognition Arousal: Alert Behavior During Therapy: WFL for tasks assessed/performed Overall Cognitive Status: Within Functional Limits for tasks assessed           General Comments General comments (skin integrity, edema, etc.): spoke with pt and daughter concerning recommendations on discharge      Pertinent Vitals/Pain Pain Assessment Pain Assessment: No/denies  pain     PT Goals (current goals can now  be found in the care plan section) Acute Rehab PT Goals Patient Stated Goal: to get back to walking without DME PT Goal Formulation: With patient Time For Goal Achievement: 02/18/23 Potential to Achieve Goals: Good Progress towards PT goals: Progressing toward goals    Frequency    Min 1X/week      PT Plan  Continue with current POC       AM-PAC PT "6 Clicks" Mobility   Outcome Measure  Help needed turning from your back to your side while in a flat bed without using bedrails?: None Help needed moving from lying on your back to sitting on the side of a flat bed without using bedrails?: None Help needed moving to and from a bed to a chair (including a wheelchair)?: None Help needed standing up from a chair using your arms (e.g., wheelchair or bedside chair)?: None Help needed to walk in hospital room?: None Help needed climbing 3-5 steps with a railing? : A Little 6 Click Score: 23    End of Session Equipment Utilized During Treatment: Gait belt Activity Tolerance: Patient tolerated treatment well Patient left: in chair;with call bell/phone within reach;with family/visitor present Nurse Communication: Mobility status PT Visit Diagnosis: Unsteadiness on feet (R26.81);Other abnormalities of gait and mobility (R26.89);Muscle weakness (generalized) (M62.81);Difficulty in walking, not elsewhere classified (R26.2);Other symptoms and signs involving the nervous system (R29.898)     Time: 1037-1100 PT Time Calculation (min) (ACUTE ONLY): 23 min  Charges:    $Gait Training: 8-22 mins $Therapeutic Activity: 8-22 mins PT General Charges $$ ACUTE PT VISIT: 1 Visit                     Harrel Carina, DPT, CLT  Acute Rehabilitation Services Office: [phone] (Secure chat preferred)    Claudia Desanctis 02/07/2023, 11:11 AM

## 2023-02-07 NOTE — TOC Transition Note (Addendum)
Transition of Care [facility]) - CM/SW Discharge Note   Patient Details  Name: Michelle Fuller MRN: [id number] Date of Birth: 1934-12-04  [facility] CM/SW Contact:  Kermit Balo, RN Phone Number: 02/07/2023, 2:19 PM   Clinical Narrative:    Pt is discharging to her ALF at [facility] with home health services through [facility]. Information on the AVS.  ALF has been faxed her FL2, d/c summary and dc paperwork.  Son will provide transportation to facility.    Final next level of care: Home w Home Health Services (at [facility]) Barriers to Discharge: No Barriers Identified   Patient Goals and CMS Choice      Discharge Placement                         Discharge Plan and Services Additional resources added to the After Visit Summary for                            HH Arranged: RN, PT, OT [facility] Agency: [facility] Date [facility] Agency Contacted: 02/07/23   Representative spoke with at [facility] Agency: Kandee Keen  Social Determinants of Health (SDOH) Interventions SDOH Screenings   Food Insecurity: No Food Insecurity (01/07/2023)  Housing: Low Risk  (01/07/2023)  Transportation Needs: No Transportation Needs (01/07/2023)  Utilities: Not At Risk (01/07/2023)  Tobacco Use: Low Risk  (02/03/2023)     Readmission Risk Interventions     No data to display

## 2023-02-07 NOTE — Progress Notes (Signed)
Occupational Therapy Treatment Patient Details Name: Michelle Fuller MRN: [id number] DOB: Jan 04, 1935 Today's Date: 02/07/2023   History of present illness 88 y.o. female who presented 02/03/23 for elective R-sided burr holes and placement of subdural drain for evacuation of SDH. Pt fell in August 2024 and sustained a R SDH (stable) and has since developed poor ambulatory ability, low energy, and functional loss. PMH includes: HTN, breast cancer s/p radiation therapy, cataracts, depression, HLD, HTN, and pacemaker placement 2022.   OT comments  Pt demonstrates adequate level for d/c home with home services at this time. Pt with decreased gait velocity but able to ambulate from room to rehab gym and back with pathfinding. Pt demonstrates adequate level for shower / shower seat transfer supervision level. Ot will sign off acutely at this time.       If plan is discharge home, recommend the following:  A little help with walking and/or transfers;Direct supervision/assist for medications management;Direct supervision/assist for financial management;Assist for transportation;Assistance with cooking/housework;A little help with bathing/dressing/bathroom   Equipment Recommendations  None recommended by OT    Recommendations for Other Services      Precautions / Restrictions Precautions Precautions: Fall Precaution Comments: drain removed Restrictions Weight Bearing Restrictions: No       Mobility Bed Mobility               General bed mobility comments: oob on arrival in chair    Transfers Overall transfer level: Needs assistance   Transfers: Sit to/from Stand Sit to Stand: Supervision     Step pivot transfers: Supervision           Balance   Sitting-balance support: No upper extremity supported, Feet supported Sitting balance-Leahy Scale: Normal       Standing balance-Leahy Scale: Good                 High Level Balance Comments: pt demonstrates  decreased gait velocity and keeping a very neutral spine. pt with cues able to turn head R and L with transfer. pt with ability to complete dual task during transfer with the same slower gait velocity Standardized Balance Assessment Standardized Balance Assessment : Berg Balance Test   Dynamic Gait Index Level Surface: Mild Impairment Gait with Horizontal Head Turns: Mild Impairment Gait with Vertical Head Turns: Mild Impairment     ADL either performed or assessed with clinical judgement   ADL Overall ADL's : Needs assistance/impaired Eating/Feeding: Supervision/ safety   Grooming: Supervision/safety               Lower Body Dressing: Supervision/safety;Sit to/from stand Lower Body Dressing Details (indicate cue type and reason): able to reach LB with figure 4 cross Toilet Transfer: Supervision/safety           Functional mobility during ADLs: Supervision/safety      Extremity/Trunk Assessment     Lower Extremity Assessment Lower Extremity Assessment: Defer to PT evaluation        Vision   Vision Assessment?: Yes Additional Comments: pt able to scan large bulletin board for information and accuracy provide information back. pt demonstrates light sensitivity and verbalized this is baseline from her "dry eyes" and needing her "extra strength drops right now that are at her home"   Perception     Praxis      Cognition Arousal: Alert Behavior During Therapy: [facility] for tasks assessed/performed Overall Cognitive Status: Within Functional Limits for tasks assessed  General Comments: observed patient dial the correct number and order the lunch order. pt even checkin g to make sure they understood her room number. pt demonstrates dual tasking with ambultion. pt able to recall information after 10 minutes from lunch order        Exercises      Shoulder Instructions       General Comments spoke with pt and daughter  concerning recommendations on discharge    Pertinent Vitals/ Pain          Home Living                                          Prior Functioning/Environment              Frequency  Min 1X/week        Progress Toward Goals  OT Goals(current goals can now be found in the care plan section)  Progress towards OT goals: Progressing toward goals  Acute Rehab OT Goals Patient Stated Goal: to be able to go home today OT Goal Formulation: With patient Time For Goal Achievement: 02/18/23 Potential to Achieve Goals: Good ADL Goals Pt Will Perform Lower Body Bathing: with min assist;sitting/lateral leans;sit to/from stand (met) Pt Will Perform Lower Body Dressing: sit to/from stand;sitting/lateral leans;with min assist (met) Pt Will Perform Toileting - Clothing Manipulation and hygiene: with contact guard assist;sitting/lateral leans;sit to/from stand Pt Will Perform Tub/Shower Transfer: with contact guard assist;shower seat;grab bars;ambulating (walk in shower with seat- met) Additional ADL Goal #1: Patient will be able to complete functional task in standing for 3-5 minutes prior to needing seated rest break. Additional ADL Goal #2: Patient will complete upper level cognitive task without errors in order to return to prior level of independence safely.  Plan      Co-evaluation                 AM-PAC OT "6 Clicks" Daily Activity     Outcome Measure   Help from another person eating meals?: None Help from another person taking care of personal grooming?: None Help from another person toileting, which includes using toliet, bedpan, or urinal?: A Little Help from another person bathing (including washing, rinsing, drying)?: A Little Help from another person to put on and taking off regular upper body clothing?: A Little Help from another person to put on and taking off regular lower body clothing?: A Little 6 Click Score: 20    End of Session  Equipment Utilized During Treatment: Gait belt  OT Visit Diagnosis: Unsteadiness on feet (R26.81);Other abnormalities of gait and mobility (R26.89);Repeated falls (R29.6);Muscle weakness (generalized) (M62.81);History of falling (Z91.81);Other symptoms and signs involving cognitive function;Pain;Dizziness and giddiness (R42)   Activity Tolerance Patient tolerated treatment well   Patient Left in chair;with call bell/phone within reach   Nurse Communication Mobility status;Precautions        Time: 1129-1140 OT Time Calculation (min): 11 min  Charges: OT General Charges $OT Visit: 1 Visit OT Treatments $Self Care/Home Management : 8-22 mins   Brynn, OTR/L  Acute Rehabilitation Services Office: [phone] .   Mateo Flow 02/07/2023, 12:51 PM

## 2023-02-07 NOTE — H&P (Signed)
CC: dizziness, balance issues  HPI:     Patient is a 88 y.o. female presents after fall with head trauma 2 months ago, found to have large right chronic SDH, unchanged in size on repeat CT 2 weeks later.  She has developed poor ambulatory ability, low energy, and functional loss.    Patient Active Problem List   Diagnosis Date Noted   SDH (subdural hematoma) (HCC) 01/06/2023   Pacemaker 09/16/2020   Symptomatic bradycardia 05/26/2020   Sick sinus syndrome (HCC) 04/04/2020   Renal lesion 03/27/2017   Nausea vomiting and diarrhea 03/27/2017   Abdominal pain 03/27/2017   Mixed dyslipidemia 08/10/2016   Multinodular goiter (nontoxic) 08/10/2016   Vitamin D insufficiency 08/10/2016   Mobitz type II atrioventricular block 04/21/2015   Pain in the chest    Chest pain 04/20/2015   GERD (gastroesophageal reflux disease) 04/20/2015   HX: breast cancer 09/10/2013   Diverticulitis 03/31/2013   Vasovagal near syncope 03/31/2013   Visual disturbance, transient 01/16/2013   Hypertension 01/16/2013   Hyperlipidemia 01/16/2013   Cerebrovascular disease 01/16/2013   Essential tremor 01/16/2013   Past Medical History:  Diagnosis Date   Abnormal Pap smear of cervix    Arthritis    Breast cancer (HCC)    Right breast, status post XRT   Cataracts, both eyes    Chest pain 2015   recent episode while in sun- recovered in shade with water   Colon polyp    Depression    Diverticulitis    Diverticulosis    Elevated LDL cholesterol level    Essential tremor    Goiter    multinodular   Hyperlipidemia    Hypertension    Osteopenia 12/1995   Spine   Ovarian cyst    found on CT scan   Post-menopausal bleeding 01/2000   Huge polyps exc   Presence of permanent cardiac pacemaker    Syncope    Vitamin D deficiency     Past Surgical History:  Procedure Laterality Date   Bilateral cateract surgery  2014   BREAST LUMPECTOMY Right 4/96   HYSTEROSCOPY  0/01   LAPAROSCOPIC BILATERAL  SALPINGO OOPHERECTOMY Bilateral 01/07/2014   Procedure: LAPAROSCOPIC BILATERAL SALPINGO OOPHORECTOMY;  Surgeon: Annamaria Boots, MD;  Location: [facility];  Service: Gynecology;  Laterality: Bilateral;   PACEMAKER IMPLANT N/A 05/26/2020   Procedure: PACEMAKER IMPLANT;  Surgeon: Lanier Prude, MD;  Location: [facility] INVASIVE CV LAB;  Service: Cardiovascular;  Laterality: N/A;    Medications Prior to Admission  Medication Sig Dispense Refill Last Dose   amLODipine (NORVASC) 5 MG tablet Take 1 tablet (5 mg total) by mouth daily. 90 tablet 3 02/03/2023   atorvastatin (LIPITOR) 10 MG tablet Take 1 tablet (10 mg total) by mouth every morning. 90 tablet 2 02/03/2023   Calcium Citrate-Vitamin D (CITRACAL PETITES/VITAMIN D PO) Take 2 tablets by mouth 2 (two) times daily.   02/02/2023   carboxymethylcellulose (REFRESH PLUS) 0.5 % SOLN Place 1 drop into both eyes 2 (two) times daily.   02/02/2023   ciprofloxacin (CIPRO) 250 MG tablet Take 250 mg by mouth 2 (two) times daily between meals as needed (UTI).      clonazePAM (KLONOPIN) 0.5 MG tablet Take 0.25 mg by mouth at bedtime.   02/02/2023   Flaxseed, Linseed, (SM FLAX SEED OIL) 1000 MG CAPS Take 1,000 mg by mouth every morning.    02/02/2023   ibandronate (BONIVA) 150 MG tablet Take 150 mg by mouth every 30 (thirty) days.  02/02/2023   Lactobacillus Rhamnosus, GG, (CULTURELLE PO) Take 1 capsule by mouth every morning.    02/02/2023   Multiple Vitamin (MULTIVITAMIN WITH MINERALS) TABS tablet Take 1 tablet by mouth every morning.   02/02/2023   polyethylene glycol powder (GLYCOLAX/MIRALAX) powder Take 17 g by mouth daily as needed for moderate constipation or severe constipation.   02/02/2023   primidone (MYSOLINE) 50 MG tablet Take 3 tablets (150 mg total) by mouth daily with breakfast.   02/03/2023   propranolol (INDERAL) 40 MG tablet Take 1 tablet (40 mg total) by mouth 2 (two) times daily. 180 tablet 3 02/03/2023   valsartan (DIOVAN) 320 MG tablet Take 1 tablet (320  mg total) by mouth every morning. 90 tablet 2 02/02/2023   HYDROcodone-acetaminophen (NORCO/VICODIN) 5-325 MG tablet Take 1 tablet by mouth every 6 (six) hours as needed for moderate pain. 20 tablet 0    omeprazole (PRILOSEC) 20 MG capsule Take 20 mg by mouth See admin instructions. Take 20 mg every day for two weeks every four months      ondansetron (ZOFRAN) 4 MG tablet Take 1 tablet (4 mg total) by mouth every 8 (eight) hours as needed for nausea or vomiting. (Patient not taking: Reported on 01/31/2023) 30 tablet 0 Not Taking   oxyCODONE (ROXICODONE) 5 MG immediate release tablet Take 1 tablet (5 mg total) by mouth every 6 (six) hours as needed for moderate pain. (Patient not taking: Reported on 01/31/2023) 20 tablet 0 Not Taking   Allergies  Allergen Reactions   Clonidine Other (See Comments)    Fatigue drymouth   Hydrocodone-Acetaminophen Nausea Only   Latex Itching and Rash   Bactrim [Sulfamethoxazole-Trimethoprim] Rash   Flagyl [Metronidazole] Hives, Nausea And Vomiting, Nausea Only and Rash   Penicillins Hives and Rash    Reaction: 2 year ago   Shingrix [Zoster Vac Recomb Adjuvanted] Other (See Comments)    Chills, nausea, diarrhea, and lightheadedness after second dose    Social History   Tobacco Use   Smoking status: Never   Smokeless tobacco: Never  Substance Use Topics   Alcohol use: No    Alcohol/week: 0.0 standard drinks of alcohol    Family History  Problem Relation Age of Onset   Heart disease Mother        Died at 41 of MI - says doctors thought it was due to mistreatment of thyroid dz in youth   Thyroid disease Mother    Heart attack Mother    Alzheimer's disease Father    Thyroid disease Maternal Grandmother    Stroke Maternal Grandmother      Review of Systems Pertinent items are noted in HPI.  Objective:   Patient Vitals for the past 8 hrs:  BP Temp Temp src Pulse Resp SpO2 Height Weight  02/03/23 1115 (!) 163/90 97.8 F (36.6 C) Oral 85 16 98 % 5\' 4"   (1.626 m) 62.9 kg   No intake/output data recorded. No intake/output data recorded.      General : Alert, cooperative, no distress, appears stated age   Head:  Normocephalic/atraumatic    Eyes: PERRL, conjunctiva/corneas clear, EOM's intact. Fundi could not be visualized Neck: Supple Chest:  Respirations unlabored Chest wall: no tenderness or deformity Heart: Regular rate and rhythm Abdomen: Soft, nontender and nondistended Extremities: warm and well-perfused Skin: normal turgor, color and texture Neurologic:  Alert, oriented x 3.  Eyes open spontaneously. PERRL, EOMI, VFC, no facial droop. V1-3 intact.  No dysarthria though speech scanning, tongue protrusion  symmetric.  CNII-XII intact. Normal strength, sensation and reflexes throughout.  Left pronator drift, full strength in legs       Data ReviewCBC:  Lab Results  Component Value Date   WBC 7.6 01/07/2023   RBC 4.83 01/07/2023   BMP:  Lab Results  Component Value Date   GLUCOSE 94 02/03/2023   CO2 23 01/07/2023   BUN 20 02/03/2023   BUN 18 05/20/2020   CREATININE 0.80 02/03/2023   CALCIUM 8.9 01/07/2023   Radiology review:  See clinic note for details  Assessment:   Active Problems:   * No active hospital problems. *  R chronic SDH Plan:   - burr holes today

## 2023-02-07 NOTE — NC FL2 (Signed)
[place] MEDICAID FL2 LEVEL OF CARE FORM     IDENTIFICATION  Patient Name: Michelle Fuller Birthdate: 26-Feb-1935 Sex: female Admission Date (Current Location): 02/03/2023  [facility] and [place] Number:  Producer, television/film/video and Address:  [facility] [place], [address], [place], [place] [address]      Provider Number: [id number]  Attending Physician Name and Address:  Bedelia Person, MD  Relative Name and Phone Number:       Current Level of Care: Hospital Recommended Level of Care: Assisted Living Facility Prior Approval Number:    Date Approved/Denied:   PASRR Number:    Discharge Plan: Other (Comment) (Assisted Living)    Current Diagnoses: Patient Active Problem List   Diagnosis Date Noted   SDH (subdural hematoma) (HCC) 01/06/2023   Pacemaker 09/16/2020   Symptomatic bradycardia 05/26/2020   Sick sinus syndrome (HCC) 04/04/2020   Renal lesion 03/27/2017   Nausea vomiting and diarrhea 03/27/2017   Abdominal pain 03/27/2017   Mixed dyslipidemia 08/10/2016   Multinodular goiter (nontoxic) 08/10/2016   Vitamin D insufficiency 08/10/2016   Mobitz type II atrioventricular block 04/21/2015   Pain in the chest    Chest pain 04/20/2015   GERD (gastroesophageal reflux disease) 04/20/2015   HX: breast cancer 09/10/2013   Diverticulitis 03/31/2013   Vasovagal near syncope 03/31/2013   Visual disturbance, transient 01/16/2013   Hypertension 01/16/2013   Hyperlipidemia 01/16/2013   Cerebrovascular disease 01/16/2013   Essential tremor 01/16/2013    Orientation RESPIRATION BLADDER Height & Weight     Self, Time, Situation, Place  Normal Continent Weight: 69.5 kg Height:  5\' 4"  (162.6 cm)  BEHAVIORAL SYMPTOMS/MOOD NEUROLOGICAL BOWEL NUTRITION STATUS      Continent Diet (Regular diet with thin liquids)  AMBULATORY STATUS COMMUNICATION OF NEEDS Skin   Supervision Verbally Surgical wounds (incision to rt side of head)                        Personal Care Assistance Level of Assistance  Bathing, Feeding, Dressing Bathing Assistance: Limited assistance Feeding assistance: Independent Dressing Assistance: Limited assistance     Functional Limitations Info  Sight, Hearing, Speech Sight Info: Impaired Hearing Info: Adequate Speech Info: Adequate    SPECIAL CARE FACTORS FREQUENCY  PT (By licensed PT), OT (By licensed OT)     PT Frequency: as per [facility] agency OT Frequency: as per HH agency            Contractures Contractures Info: Not present    Additional Factors Info  Code Status, Allergies Code Status Info: Full Allergies Info: Clonidine  Hydrocodone-acetaminophen  Latex  Bactrim (Sulfamethoxazole-trimethoprim)  Flagyl (Metronidazole)  Penicillins  Shingrix (Clonidine  Hydrocodone-acetaminophen  Latex  Bactrim (Sulfamethoxazole-trimethoprim)  Flagyl (Metronidazole)  Penicillins  Shingrix)           Current Medications (02/07/2023):  This is the current hospital active medication list Current Facility-Administered Medications  Medication Dose Route Frequency Provider Last Rate Last Admin   0.9 %  sodium chloride infusion   Intravenous Continuous Clovis Riley, PA-C   Stopped at 02/04/23 1757   acetaminophen (TYLENOL) tablet 650 mg  650 mg Oral Q4H PRN Patrici Ranks [place], PA-C       Or   acetaminophen (TYLENOL) suppository 650 mg  650 mg Rectal Q4H PRN Patrici Ranks [place], PA-C       amLODipine (NORVASC) tablet 10 mg  10 mg Oral Daily Patrici Ranks [place], [place]  10 mg at 02/07/23 0827   atorvastatin (LIPITOR) tablet 10 mg  10 mg Oral q morning Patrici Ranks [place], PA-C   10 mg at 02/07/23 0827   Chlorhexidine Gluconate Cloth 2 % PADS 6 each  6 each Topical q morning Bedelia Person, MD   6 each at 02/06/23 1000   docusate sodium (COLACE) capsule 100 mg  100 mg Oral BID Patrici Ranks [place], PA-C   100 mg at 02/07/23 5784   HYDROcodone-acetaminophen (NORCO/VICODIN) 5-325 MG per  tablet 1 tablet  1 tablet Oral Q4H PRN Clovis Riley, PA-C   1 tablet at 02/03/23 2026   irbesartan (AVAPRO) tablet 300 mg  300 mg Oral Daily Patrici Ranks [place], PA-C   300 mg at 02/07/23 0827   labetalol (NORMODYNE) injection 10-40 mg  10-40 mg Intravenous Q10 min PRN Patrici Ranks [place], PA-C   10 mg at 02/05/23 0405   levETIRAcetam (KEPPRA) IVPB 500 mg/100 mL premix  500 mg Intravenous Q12H Patrici Ranks [place], PA-C 400 mL/hr at 02/07/23 0545 500 mg at 02/07/23 0545   morphine (PF) 2 MG/ML injection 1 mg  1 mg Intravenous Q2H PRN Coletta Memos, MD   1 mg at 02/04/23 0116   ondansetron (ZOFRAN) tablet 4 mg  4 mg Oral Q4H PRN Patrici Ranks [place], PA-C       Or   ondansetron [facility]) injection 4 mg  4 mg Intravenous Q4H PRN Patrici Ranks [place], PA-C   4 mg at 02/04/23 0116   pantoprazole (PROTONIX) injection 40 mg  40 mg Intravenous QHS Patrici Ranks [place], PA-C   40 mg at 02/05/23 2136   polyethylene glycol (MIRALAX / GLYCOLAX) packet 17 g  17 g Oral Daily PRN Patrici Ranks [place], PA-C       polyvinyl alcohol (LIQUIFILM TEARS) 1.4 % ophthalmic solution 1 drop  1 drop Both Eyes PRN Patrici Ranks [place], PA-C       primidone (MYSOLINE) tablet 150 mg  150 mg Oral Q breakfast Patrici Ranks [place], [place]   150 mg at 02/07/23 6962   promethazine (PHENERGAN) tablet 12.5-25 mg  12.5-25 mg Oral Q4H PRN Patrici Ranks [place], PA-C       propranolol (INDERAL) tablet 40 mg  40 mg Oral BID Patrici Ranks [place], PA-C   40 mg at 02/07/23 9528   sodium phosphate (FLEET) enema 1 enema  1 enema Rectal Once PRN Clovis Riley, PA-C         Discharge Medications: Please see discharge summary for a list of discharge medications.  Relevant Imaging Results:  Relevant Lab Results:   Additional Information SS#:  [id number]  Kermit Balo, RN

## 2023-02-07 NOTE — Progress Notes (Signed)
Inpatient Rehab Admissions Coordinator:   Consult received for CIR. Patient's therapists are recommending Home Health at discharge. No intensive rehab needs at this time. Will sign off.     Rehab Admissons Coordinator [place], [place], [place] [phone]

## 2023-02-07 NOTE — Discharge Summary (Signed)
Patient ID: Michelle Fuller MRN: [id number] DOB/AGE: 09-16-1934 88 y.o.  Admit date: 02/03/2023 Discharge date: 02/07/2023  Admission Diagnoses: SDH  Discharge Diagnoses: SDH   Discharged Condition: Stable  Hospital Course:  Michelle Fuller is a 88 y.o. female who was admitted following an uncomplicated R burr holes for evacuation of subdural hematoma. They were recovered in PACU and transferred to 4NICU, and eventually stepped down to 3W. Hospital course was uncomplicated. Pt stable for discharge today. Pt to f/u in office for routine post op visit. Pt is in agreement w/ plan.    Discharge Exam: Blood pressure (!) 153/86, pulse 88, temperature 98.9 F (37.2 C), temperature source Oral, resp. rate 16, height 5\' 4"  (1.626 m), weight 69.5 kg, last menstrual period 05/25/1999, SpO2 96%. A&O x3 Speech fluent, appropriate CN grossly intact Dressing c/d/I.   Disposition: Discharge disposition: 01-Home or Self Care        Allergies as of 02/07/2023       Reactions   Clonidine Other (See Comments)   Fatigue drymouth   Hydrocodone-acetaminophen Nausea Only   Latex Itching, Rash   Bactrim [sulfamethoxazole-trimethoprim] Rash   Flagyl [metronidazole] Hives, Nausea And Vomiting, Nausea Only, Rash   Penicillins Hives, Rash   Reaction: 2 year ago   Shingrix [zoster Vac Recomb Adjuvanted] Other (See Comments)   Chills, nausea, diarrhea, and lightheadedness after second dose        Medication List     TAKE these medications    amLODipine 5 MG tablet Commonly known as: NORVASC Take 1 tablet (5 mg total) by mouth daily.   atorvastatin 10 MG tablet Commonly known as: LIPITOR Take 1 tablet (10 mg total) by mouth every morning.   carboxymethylcellulose 0.5 % Soln Commonly known as: REFRESH PLUS Place 1 drop into both eyes 2 (two) times daily.   ciprofloxacin 250 MG tablet Commonly known as: CIPRO Take 250 mg by mouth 2 (two) times daily between meals as needed  (UTI).   CITRACAL PETITES/VITAMIN D PO Take 2 tablets by mouth 2 (two) times daily.   clonazePAM 0.5 MG tablet Commonly known as: KLONOPIN Take 0.25 mg by mouth at bedtime.   CULTURELLE PO Take 1 capsule by mouth every morning.   HYDROcodone-acetaminophen 5-325 MG tablet Commonly known as: NORCO/VICODIN Take 1 tablet by mouth every 6 (six) hours as needed for moderate pain.   ibandronate 150 MG tablet Commonly known as: BONIVA Take 150 mg by mouth every 30 (thirty) days.   multivitamin with minerals Tabs tablet Take 1 tablet by mouth every morning.   omeprazole 20 MG capsule Commonly known as: PRILOSEC Take 20 mg by mouth See admin instructions. Take 20 mg every day for two weeks every four months   ondansetron 4 MG tablet Commonly known as: Zofran Take 1 tablet (4 mg total) by mouth every 8 (eight) hours as needed for nausea or vomiting.   oxyCODONE 5 MG immediate release tablet Commonly known as: Roxicodone Take 1 tablet (5 mg total) by mouth every 6 (six) hours as needed for moderate pain.   polyethylene glycol powder 17 GM/SCOOP powder Commonly known as: GLYCOLAX/MIRALAX Take 17 g by mouth daily as needed for moderate constipation or severe constipation.   primidone 50 MG tablet Commonly known as: MYSOLINE Take 3 tablets (150 mg total) by mouth daily with breakfast.   propranolol 40 MG tablet Commonly known as: INDERAL Take 1 tablet (40 mg total) by mouth 2 (two) times daily.   SM Flax Seed Oil  1000 MG Caps Take 1,000 mg by mouth every morning.   valsartan 320 MG tablet Commonly known as: DIOVAN Take 1 tablet (320 mg total) by mouth every morning.        Follow-up Information     [facility] Follow up.   Specialty: Home Health Services Why: The home health agency will contact you for the first home visit. Contact information: [address] [place] [address] [phone]                 Signed: Clovis Riley 02/07/2023, 1:33 PM

## 2023-02-17 DIAGNOSIS — M6281 Muscle weakness (generalized): Secondary | ICD-10-CM | POA: Diagnosis not present

## 2023-02-18 DIAGNOSIS — M6281 Muscle weakness (generalized): Secondary | ICD-10-CM | POA: Diagnosis not present

## 2023-02-18 DIAGNOSIS — R278 Other lack of coordination: Secondary | ICD-10-CM | POA: Diagnosis not present

## 2023-02-21 DIAGNOSIS — R278 Other lack of coordination: Secondary | ICD-10-CM | POA: Diagnosis not present

## 2023-02-21 DIAGNOSIS — M6281 Muscle weakness (generalized): Secondary | ICD-10-CM | POA: Diagnosis not present

## 2023-02-22 DIAGNOSIS — M6281 Muscle weakness (generalized): Secondary | ICD-10-CM | POA: Diagnosis not present

## 2023-02-23 ENCOUNTER — Other Ambulatory Visit (HOSPITAL_BASED_OUTPATIENT_CLINIC_OR_DEPARTMENT_OTHER): Payer: Self-pay | Admitting: *Deleted

## 2023-02-23 DIAGNOSIS — E7800 Pure hypercholesterolemia, unspecified: Secondary | ICD-10-CM

## 2023-02-23 MED ORDER — ATORVASTATIN CALCIUM 10 MG PO TABS: 10.0000 mg | ORAL_TABLET | Freq: Every morning | ORAL | 0 refills | Status: DC

## 2023-02-24 DIAGNOSIS — M6281 Muscle weakness (generalized): Secondary | ICD-10-CM | POA: Diagnosis not present

## 2023-02-25 ENCOUNTER — Ambulatory Visit: Payer: Medicare PPO

## 2023-02-25 DIAGNOSIS — M6281 Muscle weakness (generalized): Secondary | ICD-10-CM | POA: Diagnosis not present

## 2023-02-25 DIAGNOSIS — I441 Atrioventricular block, second degree: Secondary | ICD-10-CM | POA: Diagnosis not present

## 2023-02-25 DIAGNOSIS — R278 Other lack of coordination: Secondary | ICD-10-CM | POA: Diagnosis not present

## 2023-02-25 LAB — CUP PACEART REMOTE DEVICE CHECK
Battery Remaining Longevity: 88 mo
Battery Remaining Percentage: 74 %
Battery Voltage: 3.01 V
Brady Statistic AP VP Percent: 2.8 %
Brady Statistic AP VS Percent: 1 %
Brady Statistic AS VP Percent: 95 %
Brady Statistic AS VS Percent: 1.6 %
Brady Statistic RA Percent Paced: 2.2 %
Brady Statistic RV Percent Paced: 98 %
Date Time Interrogation Session: 20241004020013
Implantable Lead Connection Status: 753985
Implantable Lead Connection Status: 753985
Implantable Lead Implant Date: 20220103
Implantable Lead Implant Date: 20220103
Implantable Lead Location: 753859
Implantable Lead Location: 753860
Implantable Pulse Generator Implant Date: 20220103
Lead Channel Impedance Value: 480 Ohm
Lead Channel Impedance Value: 530 Ohm
Lead Channel Pacing Threshold Amplitude: 0.75 V
Lead Channel Pacing Threshold Amplitude: 0.875 V
Lead Channel Pacing Threshold Pulse Width: 0.5 ms
Lead Channel Pacing Threshold Pulse Width: 0.5 ms
Lead Channel Sensing Intrinsic Amplitude: 12 mV
Lead Channel Sensing Intrinsic Amplitude: 2.6 mV
Lead Channel Setting Pacing Amplitude: 1.125
Lead Channel Setting Pacing Amplitude: 2 V
Lead Channel Setting Pacing Pulse Width: 0.5 ms
Lead Channel Setting Sensing Sensitivity: 2 mV
Pulse Gen Model: 2272
Pulse Gen Serial Number: 3885899

## 2023-02-28 DIAGNOSIS — R278 Other lack of coordination: Secondary | ICD-10-CM | POA: Diagnosis not present

## 2023-02-28 DIAGNOSIS — M6281 Muscle weakness (generalized): Secondary | ICD-10-CM | POA: Diagnosis not present

## 2023-03-01 DIAGNOSIS — M6281 Muscle weakness (generalized): Secondary | ICD-10-CM | POA: Diagnosis not present

## 2023-03-03 DIAGNOSIS — M6281 Muscle weakness (generalized): Secondary | ICD-10-CM | POA: Diagnosis not present

## 2023-03-04 DIAGNOSIS — M6281 Muscle weakness (generalized): Secondary | ICD-10-CM | POA: Diagnosis not present

## 2023-03-04 DIAGNOSIS — R278 Other lack of coordination: Secondary | ICD-10-CM | POA: Diagnosis not present

## 2023-03-04 NOTE — Progress Notes (Signed)
Remote pacemaker transmission.   

## 2023-03-07 DIAGNOSIS — M6281 Muscle weakness (generalized): Secondary | ICD-10-CM | POA: Diagnosis not present

## 2023-03-07 DIAGNOSIS — R278 Other lack of coordination: Secondary | ICD-10-CM | POA: Diagnosis not present

## 2023-03-08 DIAGNOSIS — M6281 Muscle weakness (generalized): Secondary | ICD-10-CM | POA: Diagnosis not present

## 2023-03-10 DIAGNOSIS — M6281 Muscle weakness (generalized): Secondary | ICD-10-CM | POA: Diagnosis not present

## 2023-03-11 DIAGNOSIS — R278 Other lack of coordination: Secondary | ICD-10-CM | POA: Diagnosis not present

## 2023-03-11 DIAGNOSIS — M6281 Muscle weakness (generalized): Secondary | ICD-10-CM | POA: Diagnosis not present

## 2023-03-14 DIAGNOSIS — M6281 Muscle weakness (generalized): Secondary | ICD-10-CM | POA: Diagnosis not present

## 2023-03-14 DIAGNOSIS — R278 Other lack of coordination: Secondary | ICD-10-CM | POA: Diagnosis not present

## 2023-03-15 DIAGNOSIS — M6281 Muscle weakness (generalized): Secondary | ICD-10-CM | POA: Diagnosis not present

## 2023-03-17 DIAGNOSIS — M6281 Muscle weakness (generalized): Secondary | ICD-10-CM | POA: Diagnosis not present

## 2023-03-21 DIAGNOSIS — M6281 Muscle weakness (generalized): Secondary | ICD-10-CM | POA: Diagnosis not present

## 2023-03-21 DIAGNOSIS — R278 Other lack of coordination: Secondary | ICD-10-CM | POA: Diagnosis not present

## 2023-03-22 DIAGNOSIS — H524 Presbyopia: Secondary | ICD-10-CM | POA: Diagnosis not present

## 2023-03-22 DIAGNOSIS — Z961 Presence of intraocular lens: Secondary | ICD-10-CM | POA: Diagnosis not present

## 2023-03-22 DIAGNOSIS — H04123 Dry eye syndrome of bilateral lacrimal glands: Secondary | ICD-10-CM | POA: Diagnosis not present

## 2023-03-24 DIAGNOSIS — M6281 Muscle weakness (generalized): Secondary | ICD-10-CM | POA: Diagnosis not present

## 2023-03-25 DIAGNOSIS — M6281 Muscle weakness (generalized): Secondary | ICD-10-CM | POA: Diagnosis not present

## 2023-03-25 DIAGNOSIS — R278 Other lack of coordination: Secondary | ICD-10-CM | POA: Diagnosis not present

## 2023-03-28 DIAGNOSIS — M6281 Muscle weakness (generalized): Secondary | ICD-10-CM | POA: Diagnosis not present

## 2023-03-28 DIAGNOSIS — R278 Other lack of coordination: Secondary | ICD-10-CM | POA: Diagnosis not present

## 2023-03-29 DIAGNOSIS — M6281 Muscle weakness (generalized): Secondary | ICD-10-CM | POA: Diagnosis not present

## 2023-03-31 DIAGNOSIS — E785 Hyperlipidemia, unspecified: Secondary | ICD-10-CM | POA: Diagnosis not present

## 2023-03-31 DIAGNOSIS — E042 Nontoxic multinodular goiter: Secondary | ICD-10-CM | POA: Diagnosis not present

## 2023-03-31 DIAGNOSIS — I10 Essential (primary) hypertension: Secondary | ICD-10-CM | POA: Diagnosis not present

## 2023-03-31 DIAGNOSIS — R1319 Other dysphagia: Secondary | ICD-10-CM | POA: Diagnosis not present

## 2023-03-31 DIAGNOSIS — R251 Tremor, unspecified: Secondary | ICD-10-CM | POA: Diagnosis not present

## 2023-03-31 DIAGNOSIS — G4700 Insomnia, unspecified: Secondary | ICD-10-CM | POA: Diagnosis not present

## 2023-03-31 DIAGNOSIS — E871 Hypo-osmolality and hyponatremia: Secondary | ICD-10-CM | POA: Diagnosis not present

## 2023-03-31 DIAGNOSIS — D692 Other nonthrombocytopenic purpura: Secondary | ICD-10-CM | POA: Diagnosis not present

## 2023-03-31 DIAGNOSIS — M810 Age-related osteoporosis without current pathological fracture: Secondary | ICD-10-CM | POA: Diagnosis not present

## 2023-04-01 DIAGNOSIS — R278 Other lack of coordination: Secondary | ICD-10-CM | POA: Diagnosis not present

## 2023-04-01 DIAGNOSIS — M6281 Muscle weakness (generalized): Secondary | ICD-10-CM | POA: Diagnosis not present

## 2023-04-04 DIAGNOSIS — M6281 Muscle weakness (generalized): Secondary | ICD-10-CM | POA: Diagnosis not present

## 2023-04-04 DIAGNOSIS — R278 Other lack of coordination: Secondary | ICD-10-CM | POA: Diagnosis not present

## 2023-04-05 DIAGNOSIS — M6281 Muscle weakness (generalized): Secondary | ICD-10-CM | POA: Diagnosis not present

## 2023-04-07 ENCOUNTER — Encounter: Payer: Self-pay | Admitting: Internal Medicine

## 2023-04-07 ENCOUNTER — Ambulatory Visit: Payer: Medicare PPO | Attending: Internal Medicine | Admitting: Internal Medicine

## 2023-04-07 VITALS — BP 138/90 | HR 90 | Ht 64.0 in | Wt 143.0 lb

## 2023-04-07 DIAGNOSIS — I10 Essential (primary) hypertension: Secondary | ICD-10-CM | POA: Diagnosis not present

## 2023-04-07 DIAGNOSIS — Z950 Presence of cardiac pacemaker: Secondary | ICD-10-CM

## 2023-04-07 DIAGNOSIS — E7800 Pure hypercholesterolemia, unspecified: Secondary | ICD-10-CM

## 2023-04-07 DIAGNOSIS — S065XAD Traumatic subdural hemorrhage with loss of consciousness status unknown, subsequent encounter: Secondary | ICD-10-CM | POA: Diagnosis not present

## 2023-04-07 DIAGNOSIS — S065XAA Traumatic subdural hemorrhage with loss of consciousness status unknown, initial encounter: Secondary | ICD-10-CM

## 2023-04-07 DIAGNOSIS — G250 Essential tremor: Secondary | ICD-10-CM | POA: Diagnosis not present

## 2023-04-07 NOTE — Patient Instructions (Signed)
Medication Instructions:  Your physician recommends that you continue on your current medications as directed. Please refer to the Current Medication list given to you today.  *If you need a refill on your cardiac medications before your next appointment, please call your pharmacy*  Lab Work: None  Testing/Procedures: None  Follow-Up: At [facility], you and your health needs are our priority.  As part of our continuing mission to provide you with exceptional heart care, we have created designated Provider Care Teams.  These Care Teams include your primary Cardiologist (physician) and Advanced Practice Providers (APPs -  Physician Assistants and Nurse Practitioners) who all work together to provide you with the care you need, when you need it.  We recommend signing up for the patient portal called "MyChart".  Sign up information is provided on this After Visit Summary.  MyChart is used to connect with patients for Virtual Visits (Telemedicine).  Patients are able to view lab/test results, encounter notes, upcoming appointments, etc.  Non-urgent messages can be sent to your provider as well.   To learn more about what you can do with MyChart, go to [email or web address].    Your next appointment:   12 month(s)  Provider:   Dr. Weston Brass

## 2023-04-07 NOTE — Progress Notes (Signed)
Cardiology Office Note:  .   Date:  04/07/2023  ID:  Michelle Fuller, DOB 10/29/1934, MRN [id number] PCP: Charlane Ferretti, DO  [facility] Providers Cardiologist:  Parke Poisson, MD Electrophysiologist:  Lanier Prude, MD    History of Present Illness: .   Michelle Fuller is a 88 y.o. female.  Discussed the use of AI scribe software for clinical note transcription with the patient, who gave verbal consent to proceed.  History of Present Illness   The patient, with a history of subdural hematoma due to falls, presents for a routine follow-up. She underwent a procedure for the hematoma about two months ago and has been recovering since. The patient reports several subsequent falls post the initial incident. She has been working with physical therapy to regain balance and has noticed improvement in her condition.  A new symptom that has emerged post-procedure is swelling in the left leg, more noticeable by nighttime. The swelling extends to the foot but is not significantly worse than the ankle. The patient has been fitted with new shoes for balance, and the swelling has been slightly better since. The patient also reports a history of high blood pressure and was previously on indapamide, a diuretic, which was stopped about six months ago. PCP is considering resuming.   The patient also has a history of tremors, for which she is on propranolol. The tremors have improved but are not entirely gone. The patient has recently moved from independent living to assisted living, which has been a significant adjustment. However, she is beginning to adjust and likes the social aspect of group living.        ROS: negative except per HPI above.  Studies Reviewed: .        Results   LABS Sodium: hyponatremia Potassium: normokalemia  DIAGNOSTIC EKG: Pacemaker functioning (01/2023)     Risk Assessment/Calculations:        Physical Exam:   VS:  BP (!) 138/90 (BP Location:  Left Arm, Patient Position: Sitting, Cuff Size: Normal)   Pulse 90   Ht 5\' 4"  (1.626 m)   Wt 143 lb (64.9 kg)   LMP 05/25/1999   SpO2 96%   BMI 24.55 kg/m    Wt Readings from Last 3 Encounters:  04/07/23 143 lb (64.9 kg)  02/03/23 153 lb 3.5 oz (69.5 kg)  01/22/23 138 lb 10.7 oz (62.9 kg)     Physical Exam   VITALS: BP- 130/80 CHEST: lungs clear to auscultation CARDIOVASCULAR: heart sounds normal     GEN: Well nourished, well developed in no acute distress NECK: No JVD; No carotid bruits CARDIAC: RRR, no murmurs, rubs, gallops RESPIRATORY:  Clear to auscultation without rales, wheezing or rhonchi  ABDOMEN: Soft, non-tender, non-distended EXTREMITIES:  No edema; No deformity   ASSESSMENT AND PLAN: .    1. Cardiac pacemaker in situ   2. Primary hypertension   3. Pure hypercholesterolemia   4. Essential tremor   5. SDH (subdural hematoma) (HCC)     Assessment and Plan    Subdural Hematoma Recovering from procedure performed two months ago. No new neurological symptoms reported.  Lower Extremity Edema Noted predominantly in the left leg, more pronounced in the evening. No associated pain. Possible relation to recent surgical procedure and reduced activity. -Consider resumption of Indapamide if swelling persists and if lab results are within normal limits. -Encourage continued physical activity with PT and OT.  Hypertension Blood pressure slightly elevated during the visit, possibly due  to stress. Patient prefers to minimize medication use. -Continue current management with Valsartan and Amlodipine, propranolol. -Consider resumption of Indapamide if blood pressure remains consistently elevated. PCP can determine.  Essential Tremor Managed with Propranolol. -Continue current management.  Pacemaker for Cardiac Arrhythmia No issues reported. -Continue current management and follow-up with Cardiology as needed.  Follow-up -Schedule a follow-up visit in one  year. -Encourage patient to contact the office if any issues arise before the scheduled visit.                  Weston Brass, MD, [facility]  [facility]

## 2023-04-08 DIAGNOSIS — R278 Other lack of coordination: Secondary | ICD-10-CM | POA: Diagnosis not present

## 2023-04-08 DIAGNOSIS — M6281 Muscle weakness (generalized): Secondary | ICD-10-CM | POA: Diagnosis not present

## 2023-04-11 DIAGNOSIS — R278 Other lack of coordination: Secondary | ICD-10-CM | POA: Diagnosis not present

## 2023-04-11 DIAGNOSIS — M6281 Muscle weakness (generalized): Secondary | ICD-10-CM | POA: Diagnosis not present

## 2023-04-14 DIAGNOSIS — M6281 Muscle weakness (generalized): Secondary | ICD-10-CM | POA: Diagnosis not present

## 2023-04-15 DIAGNOSIS — M6281 Muscle weakness (generalized): Secondary | ICD-10-CM | POA: Diagnosis not present

## 2023-04-15 DIAGNOSIS — R278 Other lack of coordination: Secondary | ICD-10-CM | POA: Diagnosis not present

## 2023-04-18 DIAGNOSIS — M6281 Muscle weakness (generalized): Secondary | ICD-10-CM | POA: Diagnosis not present

## 2023-04-18 DIAGNOSIS — R278 Other lack of coordination: Secondary | ICD-10-CM | POA: Diagnosis not present

## 2023-04-19 DIAGNOSIS — M6281 Muscle weakness (generalized): Secondary | ICD-10-CM | POA: Diagnosis not present

## 2023-04-25 ENCOUNTER — Other Ambulatory Visit: Payer: Self-pay

## 2023-04-25 DIAGNOSIS — E7800 Pure hypercholesterolemia, unspecified: Secondary | ICD-10-CM

## 2023-04-25 MED ORDER — PROPRANOLOL HCL 40 MG PO TABS: 40.0000 mg | ORAL_TABLET | Freq: Two times a day (BID) | ORAL | 3 refills | Status: AC

## 2023-04-26 DIAGNOSIS — M6281 Muscle weakness (generalized): Secondary | ICD-10-CM | POA: Diagnosis not present

## 2023-04-28 DIAGNOSIS — M6281 Muscle weakness (generalized): Secondary | ICD-10-CM | POA: Diagnosis not present

## 2023-04-29 DIAGNOSIS — M6281 Muscle weakness (generalized): Secondary | ICD-10-CM | POA: Diagnosis not present

## 2023-04-29 DIAGNOSIS — R278 Other lack of coordination: Secondary | ICD-10-CM | POA: Diagnosis not present

## 2023-05-02 DIAGNOSIS — R278 Other lack of coordination: Secondary | ICD-10-CM | POA: Diagnosis not present

## 2023-05-02 DIAGNOSIS — M6281 Muscle weakness (generalized): Secondary | ICD-10-CM | POA: Diagnosis not present

## 2023-05-03 DIAGNOSIS — M6281 Muscle weakness (generalized): Secondary | ICD-10-CM | POA: Diagnosis not present

## 2023-05-05 DIAGNOSIS — M6281 Muscle weakness (generalized): Secondary | ICD-10-CM | POA: Diagnosis not present

## 2023-05-06 DIAGNOSIS — R278 Other lack of coordination: Secondary | ICD-10-CM | POA: Diagnosis not present

## 2023-05-06 DIAGNOSIS — M6281 Muscle weakness (generalized): Secondary | ICD-10-CM | POA: Diagnosis not present

## 2023-05-09 DIAGNOSIS — M6281 Muscle weakness (generalized): Secondary | ICD-10-CM | POA: Diagnosis not present

## 2023-05-09 DIAGNOSIS — R278 Other lack of coordination: Secondary | ICD-10-CM | POA: Diagnosis not present

## 2023-05-12 DIAGNOSIS — M6281 Muscle weakness (generalized): Secondary | ICD-10-CM | POA: Diagnosis not present

## 2023-05-16 DIAGNOSIS — M6281 Muscle weakness (generalized): Secondary | ICD-10-CM | POA: Diagnosis not present

## 2023-05-16 DIAGNOSIS — R278 Other lack of coordination: Secondary | ICD-10-CM | POA: Diagnosis not present

## 2023-05-27 ENCOUNTER — Ambulatory Visit (INDEPENDENT_AMBULATORY_CARE_PROVIDER_SITE_OTHER): Payer: Medicare PPO

## 2023-05-27 DIAGNOSIS — I441 Atrioventricular block, second degree: Secondary | ICD-10-CM | POA: Diagnosis not present

## 2023-05-27 LAB — CUP PACEART REMOTE DEVICE CHECK
Battery Remaining Longevity: 85 mo
Battery Remaining Percentage: 71 %
Battery Voltage: 3.01 V
Brady Statistic AP VP Percent: 2.7 %
Brady Statistic AP VS Percent: 1 %
Brady Statistic AS VP Percent: 95 %
Brady Statistic AS VS Percent: 1.3 %
Brady Statistic RA Percent Paced: 2.1 %
Brady Statistic RV Percent Paced: 98 %
Date Time Interrogation Session: 20250103060447
Implantable Lead Connection Status: 753985
Implantable Lead Connection Status: 753985
Implantable Lead Implant Date: 20220103
Implantable Lead Implant Date: 20220103
Implantable Lead Location: 753859
Implantable Lead Location: 753860
Implantable Pulse Generator Implant Date: 20220103
Lead Channel Impedance Value: 530 Ohm
Lead Channel Impedance Value: 530 Ohm
Lead Channel Pacing Threshold Amplitude: 0.75 V
Lead Channel Pacing Threshold Amplitude: 0.75 V
Lead Channel Pacing Threshold Pulse Width: 0.5 ms
Lead Channel Pacing Threshold Pulse Width: 0.5 ms
Lead Channel Sensing Intrinsic Amplitude: 1.7 mV
Lead Channel Sensing Intrinsic Amplitude: 12 mV
Lead Channel Setting Pacing Amplitude: 1 V
Lead Channel Setting Pacing Amplitude: 2 V
Lead Channel Setting Pacing Pulse Width: 0.5 ms
Lead Channel Setting Sensing Sensitivity: 2 mV
Pulse Gen Model: 2272
Pulse Gen Serial Number: 3885899

## 2023-06-03 DIAGNOSIS — M6281 Muscle weakness (generalized): Secondary | ICD-10-CM | POA: Diagnosis not present

## 2023-06-03 DIAGNOSIS — R278 Other lack of coordination: Secondary | ICD-10-CM | POA: Diagnosis not present

## 2023-06-07 DIAGNOSIS — M6281 Muscle weakness (generalized): Secondary | ICD-10-CM | POA: Diagnosis not present

## 2023-06-08 DIAGNOSIS — M6281 Muscle weakness (generalized): Secondary | ICD-10-CM | POA: Diagnosis not present

## 2023-06-08 DIAGNOSIS — R278 Other lack of coordination: Secondary | ICD-10-CM | POA: Diagnosis not present

## 2023-06-09 DIAGNOSIS — M6281 Muscle weakness (generalized): Secondary | ICD-10-CM | POA: Diagnosis not present

## 2023-06-10 DIAGNOSIS — R278 Other lack of coordination: Secondary | ICD-10-CM | POA: Diagnosis not present

## 2023-06-10 DIAGNOSIS — M6281 Muscle weakness (generalized): Secondary | ICD-10-CM | POA: Diagnosis not present

## 2023-06-15 DIAGNOSIS — R278 Other lack of coordination: Secondary | ICD-10-CM | POA: Diagnosis not present

## 2023-06-15 DIAGNOSIS — M6281 Muscle weakness (generalized): Secondary | ICD-10-CM | POA: Diagnosis not present

## 2023-06-17 DIAGNOSIS — R278 Other lack of coordination: Secondary | ICD-10-CM | POA: Diagnosis not present

## 2023-06-17 DIAGNOSIS — M6281 Muscle weakness (generalized): Secondary | ICD-10-CM | POA: Diagnosis not present

## 2023-06-21 DIAGNOSIS — M6281 Muscle weakness (generalized): Secondary | ICD-10-CM | POA: Diagnosis not present

## 2023-06-23 DIAGNOSIS — M6281 Muscle weakness (generalized): Secondary | ICD-10-CM | POA: Diagnosis not present

## 2023-06-24 DIAGNOSIS — R278 Other lack of coordination: Secondary | ICD-10-CM | POA: Diagnosis not present

## 2023-06-24 DIAGNOSIS — M6281 Muscle weakness (generalized): Secondary | ICD-10-CM | POA: Diagnosis not present

## 2023-06-28 DIAGNOSIS — M6281 Muscle weakness (generalized): Secondary | ICD-10-CM | POA: Diagnosis not present

## 2023-07-01 DIAGNOSIS — R278 Other lack of coordination: Secondary | ICD-10-CM | POA: Diagnosis not present

## 2023-07-01 DIAGNOSIS — M6281 Muscle weakness (generalized): Secondary | ICD-10-CM | POA: Diagnosis not present

## 2023-07-01 NOTE — Progress Notes (Signed)
 Remote pacemaker transmission.

## 2023-07-05 DIAGNOSIS — D0462 Carcinoma in situ of skin of left upper limb, including shoulder: Secondary | ICD-10-CM | POA: Diagnosis not present

## 2023-07-05 DIAGNOSIS — Z85828 Personal history of other malignant neoplasm of skin: Secondary | ICD-10-CM | POA: Diagnosis not present

## 2023-07-05 DIAGNOSIS — D485 Neoplasm of uncertain behavior of skin: Secondary | ICD-10-CM | POA: Diagnosis not present

## 2023-07-07 DIAGNOSIS — M6281 Muscle weakness (generalized): Secondary | ICD-10-CM | POA: Diagnosis not present

## 2023-07-08 DIAGNOSIS — R278 Other lack of coordination: Secondary | ICD-10-CM | POA: Diagnosis not present

## 2023-07-08 DIAGNOSIS — M6281 Muscle weakness (generalized): Secondary | ICD-10-CM | POA: Diagnosis not present

## 2023-07-12 DIAGNOSIS — M6281 Muscle weakness (generalized): Secondary | ICD-10-CM | POA: Diagnosis not present

## 2023-07-14 DIAGNOSIS — M6281 Muscle weakness (generalized): Secondary | ICD-10-CM | POA: Diagnosis not present

## 2023-07-15 DIAGNOSIS — R278 Other lack of coordination: Secondary | ICD-10-CM | POA: Diagnosis not present

## 2023-07-15 DIAGNOSIS — M6281 Muscle weakness (generalized): Secondary | ICD-10-CM | POA: Diagnosis not present

## 2023-07-19 DIAGNOSIS — M6281 Muscle weakness (generalized): Secondary | ICD-10-CM | POA: Diagnosis not present

## 2023-07-20 ENCOUNTER — Other Ambulatory Visit: Payer: Self-pay

## 2023-07-20 DIAGNOSIS — E7800 Pure hypercholesterolemia, unspecified: Secondary | ICD-10-CM

## 2023-07-20 MED ORDER — AMLODIPINE BESYLATE 5 MG PO TABS: 5.0000 mg | ORAL_TABLET | Freq: Every day | ORAL | 2 refills | Status: DC

## 2023-07-26 DIAGNOSIS — M6281 Muscle weakness (generalized): Secondary | ICD-10-CM | POA: Diagnosis not present

## 2023-07-29 DIAGNOSIS — M6281 Muscle weakness (generalized): Secondary | ICD-10-CM | POA: Diagnosis not present

## 2023-07-29 DIAGNOSIS — R278 Other lack of coordination: Secondary | ICD-10-CM | POA: Diagnosis not present

## 2023-08-01 DIAGNOSIS — M6281 Muscle weakness (generalized): Secondary | ICD-10-CM | POA: Diagnosis not present

## 2023-08-02 ENCOUNTER — Other Ambulatory Visit: Payer: Self-pay

## 2023-08-02 DIAGNOSIS — E7800 Pure hypercholesterolemia, unspecified: Secondary | ICD-10-CM

## 2023-08-02 MED ORDER — VALSARTAN 320 MG PO TABS: 320.0000 mg | ORAL_TABLET | Freq: Every morning | ORAL | 2 refills | Status: DC

## 2023-08-05 DIAGNOSIS — M6281 Muscle weakness (generalized): Secondary | ICD-10-CM | POA: Diagnosis not present

## 2023-08-05 DIAGNOSIS — R278 Other lack of coordination: Secondary | ICD-10-CM | POA: Diagnosis not present

## 2023-08-10 DIAGNOSIS — M6281 Muscle weakness (generalized): Secondary | ICD-10-CM | POA: Diagnosis not present

## 2023-08-12 DIAGNOSIS — M6281 Muscle weakness (generalized): Secondary | ICD-10-CM | POA: Diagnosis not present

## 2023-08-12 DIAGNOSIS — R278 Other lack of coordination: Secondary | ICD-10-CM | POA: Diagnosis not present

## 2023-08-16 DIAGNOSIS — M6281 Muscle weakness (generalized): Secondary | ICD-10-CM | POA: Diagnosis not present

## 2023-08-19 DIAGNOSIS — R278 Other lack of coordination: Secondary | ICD-10-CM | POA: Diagnosis not present

## 2023-08-19 DIAGNOSIS — M6281 Muscle weakness (generalized): Secondary | ICD-10-CM | POA: Diagnosis not present

## 2023-08-24 DIAGNOSIS — M25511 Pain in right shoulder: Secondary | ICD-10-CM | POA: Diagnosis not present

## 2023-08-26 LAB — CUP PACEART REMOTE DEVICE CHECK
Battery Remaining Longevity: 84 mo
Battery Remaining Percentage: 69 %
Battery Voltage: 3.01 V
Brady Statistic AP VP Percent: 2.5 %
Brady Statistic AP VS Percent: 1 %
Brady Statistic AS VP Percent: 96 %
Brady Statistic AS VS Percent: 1.1 %
Brady Statistic RA Percent Paced: 1.9 %
Brady Statistic RV Percent Paced: 98 %
Date Time Interrogation Session: 20250404034729
Implantable Lead Connection Status: 753985
Implantable Lead Connection Status: 753985
Implantable Lead Implant Date: 20220103
Implantable Lead Implant Date: 20220103
Implantable Lead Location: 753859
Implantable Lead Location: 753860
Implantable Pulse Generator Implant Date: 20220103
Lead Channel Impedance Value: 540 Ohm
Lead Channel Impedance Value: 580 Ohm
Lead Channel Pacing Threshold Amplitude: 0.75 V
Lead Channel Pacing Threshold Amplitude: 0.75 V
Lead Channel Pacing Threshold Pulse Width: 0.5 ms
Lead Channel Pacing Threshold Pulse Width: 0.5 ms
Lead Channel Sensing Intrinsic Amplitude: 12 mV
Lead Channel Sensing Intrinsic Amplitude: 3.2 mV
Lead Channel Setting Pacing Amplitude: 1 V
Lead Channel Setting Pacing Amplitude: 2 V
Lead Channel Setting Pacing Pulse Width: 0.5 ms
Lead Channel Setting Sensing Sensitivity: 2 mV
Pulse Gen Model: 2272
Pulse Gen Serial Number: 3885899

## 2023-08-29 ENCOUNTER — Ambulatory Visit: Payer: Medicare PPO

## 2023-08-29 DIAGNOSIS — I441 Atrioventricular block, second degree: Secondary | ICD-10-CM

## 2023-09-29 ENCOUNTER — Other Ambulatory Visit (HOSPITAL_BASED_OUTPATIENT_CLINIC_OR_DEPARTMENT_OTHER): Payer: Self-pay | Admitting: Cardiology

## 2023-09-29 DIAGNOSIS — E7800 Pure hypercholesterolemia, unspecified: Secondary | ICD-10-CM

## 2023-10-07 DIAGNOSIS — E785 Hyperlipidemia, unspecified: Secondary | ICD-10-CM | POA: Diagnosis not present

## 2023-10-07 DIAGNOSIS — E042 Nontoxic multinodular goiter: Secondary | ICD-10-CM | POA: Diagnosis not present

## 2023-10-07 DIAGNOSIS — E871 Hypo-osmolality and hyponatremia: Secondary | ICD-10-CM | POA: Diagnosis not present

## 2023-10-07 DIAGNOSIS — I10 Essential (primary) hypertension: Secondary | ICD-10-CM | POA: Diagnosis not present

## 2023-10-07 DIAGNOSIS — M810 Age-related osteoporosis without current pathological fracture: Secondary | ICD-10-CM | POA: Diagnosis not present

## 2023-10-10 NOTE — Progress Notes (Signed)
 Remote pacemaker transmission.

## 2023-10-14 DIAGNOSIS — M810 Age-related osteoporosis without current pathological fracture: Secondary | ICD-10-CM | POA: Diagnosis not present

## 2023-10-14 DIAGNOSIS — N390 Urinary tract infection, site not specified: Secondary | ICD-10-CM | POA: Diagnosis not present

## 2023-10-14 DIAGNOSIS — Z1339 Encounter for screening examination for other mental health and behavioral disorders: Secondary | ICD-10-CM | POA: Diagnosis not present

## 2023-10-14 DIAGNOSIS — Z0000 Encounter for general adult medical examination without abnormal findings: Secondary | ICD-10-CM | POA: Diagnosis not present

## 2023-10-14 DIAGNOSIS — R251 Tremor, unspecified: Secondary | ICD-10-CM | POA: Diagnosis not present

## 2023-10-14 DIAGNOSIS — K635 Polyp of colon: Secondary | ICD-10-CM | POA: Diagnosis not present

## 2023-10-14 DIAGNOSIS — R82998 Other abnormal findings in urine: Secondary | ICD-10-CM | POA: Diagnosis not present

## 2023-10-14 DIAGNOSIS — I495 Sick sinus syndrome: Secondary | ICD-10-CM | POA: Diagnosis not present

## 2023-10-14 DIAGNOSIS — Z23 Encounter for immunization: Secondary | ICD-10-CM | POA: Diagnosis not present

## 2023-10-14 DIAGNOSIS — E785 Hyperlipidemia, unspecified: Secondary | ICD-10-CM | POA: Diagnosis not present

## 2023-10-14 DIAGNOSIS — I10 Essential (primary) hypertension: Secondary | ICD-10-CM | POA: Diagnosis not present

## 2023-10-14 DIAGNOSIS — Z853 Personal history of malignant neoplasm of breast: Secondary | ICD-10-CM | POA: Diagnosis not present

## 2023-10-14 DIAGNOSIS — G4700 Insomnia, unspecified: Secondary | ICD-10-CM | POA: Diagnosis not present

## 2023-10-14 DIAGNOSIS — Z1331 Encounter for screening for depression: Secondary | ICD-10-CM | POA: Diagnosis not present

## 2023-12-05 ENCOUNTER — Ambulatory Visit: Payer: Self-pay | Admitting: Cardiology

## 2023-12-05 ENCOUNTER — Ambulatory Visit

## 2023-12-05 DIAGNOSIS — I441 Atrioventricular block, second degree: Secondary | ICD-10-CM

## 2023-12-05 LAB — CUP PACEART REMOTE DEVICE CHECK
Battery Remaining Longevity: 79 mo
Battery Remaining Percentage: 66 %
Battery Voltage: 2.99 V
Brady Statistic AP VP Percent: 2.4 %
Brady Statistic AP VS Percent: 1 %
Brady Statistic AS VP Percent: 96 %
Brady Statistic AS VS Percent: 1 %
Brady Statistic RA Percent Paced: 1.8 %
Brady Statistic RV Percent Paced: 98 %
Date Time Interrogation Session: 20250714020017
Implantable Lead Connection Status: 753985
Implantable Lead Connection Status: 753985
Implantable Lead Implant Date: 20220103
Implantable Lead Implant Date: 20220103
Implantable Lead Location: 753859
Implantable Lead Location: 753860
Implantable Pulse Generator Implant Date: 20220103
Lead Channel Impedance Value: 490 Ohm
Lead Channel Impedance Value: 540 Ohm
Lead Channel Pacing Threshold Amplitude: 0.75 V
Lead Channel Pacing Threshold Amplitude: 0.75 V
Lead Channel Pacing Threshold Pulse Width: 0.5 ms
Lead Channel Pacing Threshold Pulse Width: 0.5 ms
Lead Channel Sensing Intrinsic Amplitude: 12 mV
Lead Channel Sensing Intrinsic Amplitude: 2.2 mV
Lead Channel Setting Pacing Amplitude: 1 V
Lead Channel Setting Pacing Amplitude: 2 V
Lead Channel Setting Pacing Pulse Width: 0.5 ms
Lead Channel Setting Sensing Sensitivity: 2 mV
Pulse Gen Model: 2272
Pulse Gen Serial Number: 3885899

## 2024-03-01 NOTE — Progress Notes (Signed)
 Remote PPM Transmission

## 2024-03-02 DIAGNOSIS — H10501 Unspecified blepharoconjunctivitis, right eye: Secondary | ICD-10-CM | POA: Diagnosis not present

## 2024-03-02 DIAGNOSIS — H02403 Unspecified ptosis of bilateral eyelids: Secondary | ICD-10-CM | POA: Diagnosis not present

## 2024-03-05 ENCOUNTER — Ambulatory Visit (INDEPENDENT_AMBULATORY_CARE_PROVIDER_SITE_OTHER)

## 2024-03-05 ENCOUNTER — Ambulatory Visit: Payer: Self-pay | Admitting: Cardiology

## 2024-03-05 DIAGNOSIS — I441 Atrioventricular block, second degree: Secondary | ICD-10-CM | POA: Diagnosis not present

## 2024-03-05 LAB — CUP PACEART REMOTE DEVICE CHECK
Battery Remaining Longevity: 76 mo
Battery Remaining Percentage: 63 %
Battery Voltage: 2.99 V
Brady Statistic AP VP Percent: 2.3 %
Brady Statistic AP VS Percent: 1 %
Brady Statistic AS VP Percent: 96 %
Brady Statistic AS VS Percent: 1 %
Brady Statistic RA Percent Paced: 1.8 %
Brady Statistic RV Percent Paced: 98 %
Date Time Interrogation Session: 20251013020016
Implantable Lead Connection Status: 753985
Implantable Lead Connection Status: 753985
Implantable Lead Implant Date: 20220103
Implantable Lead Implant Date: 20220103
Implantable Lead Location: 753859
Implantable Lead Location: 753860
Implantable Pulse Generator Implant Date: 20220103
Lead Channel Impedance Value: 480 Ohm
Lead Channel Impedance Value: 530 Ohm
Lead Channel Pacing Threshold Amplitude: 0.75 V
Lead Channel Pacing Threshold Amplitude: 0.75 V
Lead Channel Pacing Threshold Pulse Width: 0.5 ms
Lead Channel Pacing Threshold Pulse Width: 0.5 ms
Lead Channel Sensing Intrinsic Amplitude: 12 mV
Lead Channel Sensing Intrinsic Amplitude: 2.6 mV
Lead Channel Setting Pacing Amplitude: 1 V
Lead Channel Setting Pacing Amplitude: 2 V
Lead Channel Setting Pacing Pulse Width: 0.5 ms
Lead Channel Setting Sensing Sensitivity: 2 mV
Pulse Gen Model: 2272
Pulse Gen Serial Number: 3885899

## 2024-03-06 NOTE — Progress Notes (Signed)
 Remote PPM Transmission

## 2024-03-15 ENCOUNTER — Encounter: Payer: Self-pay | Admitting: Student

## 2024-03-15 ENCOUNTER — Ambulatory Visit: Attending: Student | Admitting: Student

## 2024-03-15 VITALS — BP 142/80 | HR 91 | Ht 64.0 in | Wt 141.0 lb

## 2024-03-15 DIAGNOSIS — I441 Atrioventricular block, second degree: Secondary | ICD-10-CM

## 2024-03-15 DIAGNOSIS — I10 Essential (primary) hypertension: Secondary | ICD-10-CM | POA: Diagnosis not present

## 2024-03-15 LAB — CUP PACEART INCLINIC DEVICE CHECK
Battery Remaining Longevity: 78 mo
Battery Voltage: 2.99 V
Brady Statistic RA Percent Paced: 1.7 %
Brady Statistic RV Percent Paced: 98 %
Date Time Interrogation Session: 20251023123207
Implantable Lead Connection Status: 753985
Implantable Lead Connection Status: 753985
Implantable Lead Implant Date: 20220103
Implantable Lead Implant Date: 20220103
Implantable Lead Location: 753859
Implantable Lead Location: 753860
Implantable Pulse Generator Implant Date: 20220103
Lead Channel Impedance Value: 512.5 Ohm
Lead Channel Impedance Value: 550 Ohm
Lead Channel Pacing Threshold Amplitude: 0.625 V
Lead Channel Pacing Threshold Amplitude: 0.75 V
Lead Channel Pacing Threshold Amplitude: 0.75 V
Lead Channel Pacing Threshold Pulse Width: 0.5 ms
Lead Channel Pacing Threshold Pulse Width: 0.5 ms
Lead Channel Pacing Threshold Pulse Width: 0.5 ms
Lead Channel Sensing Intrinsic Amplitude: 12 mV
Lead Channel Sensing Intrinsic Amplitude: 2.6 mV
Lead Channel Setting Pacing Amplitude: 0.875
Lead Channel Setting Pacing Amplitude: 2 V
Lead Channel Setting Pacing Pulse Width: 0.5 ms
Lead Channel Setting Sensing Sensitivity: 2 mV
Pulse Gen Model: 2272
Pulse Gen Serial Number: 3885899

## 2024-03-15 NOTE — Patient Instructions (Addendum)
 Medication Instructions:   Your physician recommends that you continue on your current medications as directed. Please refer to the Current Medication list given to you today.    *If you need a refill on your cardiac medications before your next appointment, please call your pharmacy*   Lab Work:  PLEASE GO DOWN STAIRS  [facility]  FIRST FLOOR   ( GET OFF ELEVATORS WALK TOWARDS WAITING AREA LAB LOCATED BY PHARMACY):   BMET AND CBC TODAY       If you have labs (blood work) drawn today and your tests are completely normal, you will receive your results only by: MyChart Message (if you have MyChart) OR A paper copy in the mail If you have any lab test that is abnormal or we need to change your treatment, we will call you to review the results.   Testing/Procedures: NONE ORDERED  TODAY      Follow-Up: At [facility], you and your health needs are our priority.  As part of our continuing mission to provide you with exceptional heart care, our providers are all part of one team.  This team includes your primary Cardiologist (physician) and Advanced Practice Providers or APPs (Physician Assistants and Nurse Practitioners) who all work together to provide you with the care you need, when you need it.   Your next appointment:  WITH PRIMARY  CARDIOLOGIST   NEXT AVAILABLE    2 year(s)  Provider:    You may see OLE ONEIDA HOLTS, MD  or one of the following Advanced Practice Providers on your designated Care Team:      We recommend signing up for the patient portal called MyChart.  Sign up information is provided on this After Visit Summary.  MyChart is used to connect with patients for Virtual Visits (Telemedicine).  Patients are able to view lab/test results, encounter notes, upcoming appointments, etc.  Non-urgent messages can be sent to your provider as well.   To learn more about what you can do with MyChart, go to [email or web address].   Other Instructions

## 2024-03-15 NOTE — Progress Notes (Signed)
  Electrophysiology Office Note:   ID:  Akita, Maxim 05-Nov-1934, MRN [id number]  Primary Cardiologist: Soyla DELENA Merck, MD Electrophysiologist: OLE ONEIDA HOLTS, MD      History of Present Illness:   Michelle Fuller is a 89 y.o. female with h/o symptomatic bradycardia/second degree HB s/p PPM and HTN seen today for routine electrophysiology followup.   Since last being seen in our clinic the patient reports doing OK from a cardiac perspective. Overall, she denies chest pain, palpitations, dyspnea, PND, orthopnea, nausea, vomiting, dizziness, syncope, edema, weight gain, or early satiety.   Review of systems complete and found to be negative unless listed in HPI.   EP Information / Studies Reviewed:    EKG is ordered today. Personal review as below.  EKG Interpretation Date/Time:  Thursday March 15 2024 12:15:58 EDT Ventricular Rate:  91 PR Interval:  186 QRS Duration:  164 QT Interval:  414 QTC Calculation: 509 R Axis:   -89  Text Interpretation: Atrial-sensed ventricular-paced rhythm When compared with ECG of 04-Feb-2023 16:38, Vent. rate has increased BY   9 BPM Confirmed by Lesia Sharper [phone]) on 03/15/2024 12:21:47 PM    PPM Interrogation-  reviewed in detail today,  See PACEART report.  Arrhythmia/Device History ST JUDE PPM MERLIN-CL   Physical Exam:   VS:  BP (!) 142/80   Pulse 91   Ht 5' 4 (1.626 m)   Wt 141 lb (64 kg)   LMP 05/25/1999   SpO2 94%   BMI 24.20 kg/m    Wt Readings from Last 3 Encounters:  03/15/24 141 lb (64 kg)  04/07/23 143 lb (64.9 kg)  02/03/23 153 lb 3.5 oz (69.5 kg)     GEN: No acute distress  NECK: No JVD; No carotid bruits CARDIAC: Regular rate and rhythm, no murmurs, rubs, gallops RESPIRATORY:  Clear to auscultation without rales, wheezing or rhonchi  ABDOMEN: Soft, non-tender, non-distended EXTREMITIES:  No edema; No deformity   ASSESSMENT AND PLAN:    Symptomatic bradycardia s/p Abbott PPM  Normal PPM  function See Pace Art report No changes today  HTN Stable on current regimen   Disposition:   Follow up with EP Team in 24 months. Due for Gen cards follow up.   Signed, Sharper Prentice Lesia, PA-C

## 2024-03-16 ENCOUNTER — Ambulatory Visit: Payer: Self-pay | Admitting: Student

## 2024-03-16 LAB — CBC
Hematocrit: 45.7 % (ref 34.0–46.6)
Hemoglobin: 15.1 g/dL (ref 11.1–15.9)
MCH: 30.3 pg (ref 26.6–33.0)
MCHC: 33 g/dL (ref 31.5–35.7)
MCV: 92 fL (ref 79–97)
Platelets: 262 x10E3/uL (ref 150–450)
RBC: 4.99 x10E6/uL (ref 3.77–5.28)
RDW: 12.9 % (ref 11.7–15.4)
WBC: 6.7 x10E3/uL (ref 3.4–10.8)

## 2024-03-16 LAB — BASIC METABOLIC PANEL WITH GFR
BUN/Creatinine Ratio: 20 (ref 12–28)
BUN: 17 mg/dL (ref 8–27)
CO2: 24 mmol/L (ref 20–29)
Calcium: 10 mg/dL (ref 8.7–10.3)
Chloride: 100 mmol/L (ref 96–106)
Creatinine, Ser: 0.84 mg/dL (ref 0.57–1.00)
Glucose: 86 mg/dL (ref 70–99)
Potassium: 4.8 mmol/L (ref 3.5–5.2)
Sodium: 138 mmol/L (ref 134–144)
eGFR: 67 mL/min/1.73 (ref >59)

## 2024-03-22 DIAGNOSIS — H04123 Dry eye syndrome of bilateral lacrimal glands: Secondary | ICD-10-CM | POA: Diagnosis not present

## 2024-03-29 DIAGNOSIS — I495 Sick sinus syndrome: Secondary | ICD-10-CM | POA: Diagnosis not present

## 2024-03-29 DIAGNOSIS — K635 Polyp of colon: Secondary | ICD-10-CM | POA: Diagnosis not present

## 2024-03-29 DIAGNOSIS — G4700 Insomnia, unspecified: Secondary | ICD-10-CM | POA: Diagnosis not present

## 2024-03-29 DIAGNOSIS — I10 Essential (primary) hypertension: Secondary | ICD-10-CM | POA: Diagnosis not present

## 2024-03-29 DIAGNOSIS — E785 Hyperlipidemia, unspecified: Secondary | ICD-10-CM | POA: Diagnosis not present

## 2024-03-29 DIAGNOSIS — E042 Nontoxic multinodular goiter: Secondary | ICD-10-CM | POA: Diagnosis not present

## 2024-03-29 DIAGNOSIS — Z853 Personal history of malignant neoplasm of breast: Secondary | ICD-10-CM | POA: Diagnosis not present

## 2024-03-29 DIAGNOSIS — M810 Age-related osteoporosis without current pathological fracture: Secondary | ICD-10-CM | POA: Diagnosis not present

## 2024-04-13 ENCOUNTER — Other Ambulatory Visit: Payer: Self-pay | Admitting: Internal Medicine

## 2024-04-13 DIAGNOSIS — E7800 Pure hypercholesterolemia, unspecified: Secondary | ICD-10-CM

## 2024-04-18 ENCOUNTER — Telehealth: Payer: Self-pay | Admitting: Internal Medicine

## 2024-04-18 DIAGNOSIS — E7800 Pure hypercholesterolemia, unspecified: Secondary | ICD-10-CM

## 2024-04-18 MED ORDER — AMLODIPINE BESYLATE 5 MG PO TABS: 5.0000 mg | ORAL_TABLET | Freq: Every day | ORAL | 0 refills | Status: AC

## 2024-04-18 NOTE — Telephone Encounter (Signed)
 1. Which medications need to be refilled? (please list name of each medication and dose if known)   amLODipine  (NORVASC ) 5 MG tablet    2. Would you like to learn more about the convenience, safety, & potential cost savings by using the [facility]? no      3. Are you open to using the [facility] ([facility]. no     4. Which pharmacy/location (including street and city if local pharmacy) is medication to be sent to?  [id number] - [place], [place] - [address] AT [address] OF [facility] [address]       5. Do they need a 30 day or 90 day supply? 90 day.   Pt only has 1 tablet left.

## 2024-04-18 NOTE — Telephone Encounter (Signed)
Pt called to f/u-please advise.

## 2024-04-18 NOTE — Telephone Encounter (Signed)
 Rx sent to pharmacy

## 2024-04-30 ENCOUNTER — Other Ambulatory Visit: Payer: Self-pay | Admitting: Internal Medicine

## 2024-04-30 DIAGNOSIS — E7800 Pure hypercholesterolemia, unspecified: Secondary | ICD-10-CM

## 2024-05-02 ENCOUNTER — Telehealth: Payer: Self-pay | Admitting: Internal Medicine

## 2024-05-02 DIAGNOSIS — E7800 Pure hypercholesterolemia, unspecified: Secondary | ICD-10-CM

## 2024-05-02 MED ORDER — VALSARTAN 320 MG PO TABS: 320.0000 mg | ORAL_TABLET | Freq: Every morning | ORAL | 0 refills | Status: AC

## 2024-05-02 NOTE — Telephone Encounter (Signed)
 Pt scheduled 06/07/24 to see Dr. Loni, refill sent.

## 2024-05-02 NOTE — Telephone Encounter (Signed)
°*  STAT* If patient is at the pharmacy, call can be transferred to refill team.   1. Which medications need to be refilled? (please list name of each medication and dose if known)   valsartan  (DIOVAN ) 320 MG tablet   2. Would you like to learn more about the convenience, safety, & potential cost savings by using the [facility]? no  3. Are you open to using the [facility] ([facility]. No    4. Which pharmacy/location (including street and city if local pharmacy) is medication to be sent to? [id number] - [place], [place] - [address] AT [address] [facility] [address]      5. Do they need a 30 day or 90 day supply? 90 days

## 2024-06-04 ENCOUNTER — Ambulatory Visit

## 2024-06-04 DIAGNOSIS — I441 Atrioventricular block, second degree: Secondary | ICD-10-CM

## 2024-06-05 ENCOUNTER — Ambulatory Visit: Payer: Self-pay | Admitting: Cardiovascular Disease

## 2024-06-05 LAB — CUP PACEART REMOTE DEVICE CHECK
Battery Remaining Longevity: 73 mo
Battery Remaining Percentage: 61 %
Battery Voltage: 2.99 V
Brady Statistic AP VP Percent: 2.4 %
Brady Statistic AP VS Percent: 1 %
Brady Statistic AS VP Percent: 97 %
Brady Statistic AS VS Percent: 1 %
Brady Statistic RA Percent Paced: 2.1 %
Brady Statistic RV Percent Paced: 99 %
Date Time Interrogation Session: 20260112020016
Implantable Lead Connection Status: 753985
Implantable Lead Connection Status: 753985
Implantable Lead Implant Date: 20220103
Implantable Lead Implant Date: 20220103
Implantable Lead Location: 753859
Implantable Lead Location: 753860
Implantable Pulse Generator Implant Date: 20220103
Lead Channel Impedance Value: 510 Ohm
Lead Channel Impedance Value: 530 Ohm
Lead Channel Pacing Threshold Amplitude: 0.625 V
Lead Channel Pacing Threshold Amplitude: 0.75 V
Lead Channel Pacing Threshold Pulse Width: 0.5 ms
Lead Channel Pacing Threshold Pulse Width: 0.5 ms
Lead Channel Sensing Intrinsic Amplitude: 12 mV
Lead Channel Sensing Intrinsic Amplitude: 2.6 mV
Lead Channel Setting Pacing Amplitude: 0.875
Lead Channel Setting Pacing Amplitude: 2 V
Lead Channel Setting Pacing Pulse Width: 0.5 ms
Lead Channel Setting Sensing Sensitivity: 2 mV
Pulse Gen Model: 2272
Pulse Gen Serial Number: 3885899

## 2024-06-06 NOTE — Progress Notes (Signed)
 Remote PPM Transmission

## 2024-06-07 ENCOUNTER — Ambulatory Visit: Admitting: Internal Medicine

## 2024-07-18 ENCOUNTER — Ambulatory Visit: Admitting: Internal Medicine

## 2024-09-03 ENCOUNTER — Encounter
